# Patient Record
Sex: Male | Born: 1940 | Race: White | Hispanic: No | Smoking: Former smoker
Health system: Southern US, Community
[De-identification: ages and names within clinical notes are randomized; demographics above are authoritative.]

## PROBLEM LIST (undated history)

## (undated) DIAGNOSIS — C801 Malignant (primary) neoplasm, unspecified: Secondary | ICD-10-CM

## (undated) DIAGNOSIS — M1990 Unspecified osteoarthritis, unspecified site: Secondary | ICD-10-CM

## (undated) DIAGNOSIS — I493 Ventricular premature depolarization: Secondary | ICD-10-CM

## (undated) DIAGNOSIS — R011 Cardiac murmur, unspecified: Secondary | ICD-10-CM

## (undated) DIAGNOSIS — C642 Malignant neoplasm of left kidney, except renal pelvis: Secondary | ICD-10-CM

## (undated) DIAGNOSIS — R600 Localized edema: Secondary | ICD-10-CM

## (undated) DIAGNOSIS — Z8659 Personal history of other mental and behavioral disorders: Secondary | ICD-10-CM

## (undated) DIAGNOSIS — I050 Rheumatic mitral stenosis: Secondary | ICD-10-CM

## (undated) DIAGNOSIS — Z8679 Personal history of other diseases of the circulatory system: Secondary | ICD-10-CM

## (undated) DIAGNOSIS — N2889 Other specified disorders of kidney and ureter: Secondary | ICD-10-CM

## (undated) DIAGNOSIS — I350 Nonrheumatic aortic (valve) stenosis: Secondary | ICD-10-CM

## (undated) DIAGNOSIS — R0600 Dyspnea, unspecified: Secondary | ICD-10-CM

## (undated) DIAGNOSIS — T4145XA Adverse effect of unspecified anesthetic, initial encounter: Secondary | ICD-10-CM

## (undated) DIAGNOSIS — Z7189 Other specified counseling: Secondary | ICD-10-CM

## (undated) DIAGNOSIS — I2510 Atherosclerotic heart disease of native coronary artery without angina pectoris: Secondary | ICD-10-CM

## (undated) DIAGNOSIS — F32A Depression, unspecified: Secondary | ICD-10-CM

## (undated) DIAGNOSIS — I429 Cardiomyopathy, unspecified: Secondary | ICD-10-CM

## (undated) DIAGNOSIS — C649 Malignant neoplasm of unspecified kidney, except renal pelvis: Secondary | ICD-10-CM

## (undated) DIAGNOSIS — I452 Bifascicular block: Secondary | ICD-10-CM

## (undated) DIAGNOSIS — E785 Hyperlipidemia, unspecified: Secondary | ICD-10-CM

## (undated) DIAGNOSIS — I10 Essential (primary) hypertension: Secondary | ICD-10-CM

## (undated) DIAGNOSIS — D649 Anemia, unspecified: Secondary | ICD-10-CM

## (undated) DIAGNOSIS — F329 Major depressive disorder, single episode, unspecified: Secondary | ICD-10-CM

## (undated) DIAGNOSIS — K222 Esophageal obstruction: Secondary | ICD-10-CM

## (undated) DIAGNOSIS — T8859XA Other complications of anesthesia, initial encounter: Secondary | ICD-10-CM

## (undated) DIAGNOSIS — F419 Anxiety disorder, unspecified: Secondary | ICD-10-CM

## (undated) HISTORY — PX: UPPER GI ENDOSCOPY: SHX6162

## (undated) HISTORY — PX: CARDIOVERSION: SHX1299

## (undated) HISTORY — PX: COLONOSCOPY: SHX174

## (undated) HISTORY — DX: Malignant neoplasm of left kidney, except renal pelvis: C64.2

## (undated) HISTORY — PX: ESOPHAGEAL DILATION: SHX303

## (undated) HISTORY — PX: TOTAL HIP ARTHROPLASTY: SHX124

## (undated) HISTORY — DX: Other specified counseling: Z71.89

## (undated) HISTORY — PX: TOTAL KNEE ARTHROPLASTY: SHX125

## (undated) HISTORY — PX: KNEE ARTHROSCOPY: SHX127

## (undated) HISTORY — PX: SHOULDER ARTHROSCOPY: SHX128

## (undated) HISTORY — PX: TOTAL SHOULDER REPLACEMENT: SUR1217

---

## 1999-04-22 ENCOUNTER — Ambulatory Visit (HOSPITAL_COMMUNITY): Admission: RE | Admit: 1999-04-22 | Discharge: 1999-04-22 | Payer: Self-pay | Admitting: Gastroenterology

## 2000-02-09 ENCOUNTER — Encounter: Admission: RE | Admit: 2000-02-09 | Discharge: 2000-02-09 | Payer: Self-pay | Admitting: *Deleted

## 2000-02-09 ENCOUNTER — Encounter: Payer: Self-pay | Admitting: *Deleted

## 2000-06-10 ENCOUNTER — Inpatient Hospital Stay (HOSPITAL_COMMUNITY): Admission: RE | Admit: 2000-06-10 | Discharge: 2000-06-14 | Payer: Self-pay | Admitting: Orthopaedic Surgery

## 2000-06-13 ENCOUNTER — Encounter: Payer: Self-pay | Admitting: Orthopaedic Surgery

## 2004-01-01 ENCOUNTER — Inpatient Hospital Stay (HOSPITAL_COMMUNITY): Admission: RE | Admit: 2004-01-01 | Discharge: 2004-01-03 | Payer: Self-pay | Admitting: Orthopaedic Surgery

## 2004-09-02 ENCOUNTER — Inpatient Hospital Stay (HOSPITAL_COMMUNITY): Admission: RE | Admit: 2004-09-02 | Discharge: 2004-09-06 | Payer: Self-pay | Admitting: Orthopaedic Surgery

## 2004-09-24 ENCOUNTER — Ambulatory Visit: Payer: Self-pay | Admitting: Internal Medicine

## 2007-03-02 DIAGNOSIS — I10 Essential (primary) hypertension: Secondary | ICD-10-CM | POA: Insufficient documentation

## 2007-03-02 DIAGNOSIS — K219 Gastro-esophageal reflux disease without esophagitis: Secondary | ICD-10-CM | POA: Insufficient documentation

## 2008-03-13 ENCOUNTER — Inpatient Hospital Stay (HOSPITAL_COMMUNITY): Admission: RE | Admit: 2008-03-13 | Discharge: 2008-03-17 | Payer: Self-pay | Admitting: Orthopaedic Surgery

## 2010-03-25 ENCOUNTER — Inpatient Hospital Stay (HOSPITAL_COMMUNITY): Admission: RE | Admit: 2010-03-25 | Discharge: 2010-03-31 | Payer: Self-pay | Admitting: Orthopaedic Surgery

## 2010-09-10 LAB — PROTIME-INR
INR: 1.61 — ABNORMAL HIGH (ref 0.00–1.49)
INR: 2.01 — ABNORMAL HIGH (ref 0.00–1.49)
INR: 2.08 — ABNORMAL HIGH (ref 0.00–1.49)
Prothrombin Time: 19.3 seconds — ABNORMAL HIGH (ref 11.6–15.2)
Prothrombin Time: 22.9 seconds — ABNORMAL HIGH (ref 11.6–15.2)
Prothrombin Time: 23.5 seconds — ABNORMAL HIGH (ref 11.6–15.2)

## 2010-09-10 LAB — CBC
HCT: 36.7 % — ABNORMAL LOW (ref 39.0–52.0)
HCT: 37 % — ABNORMAL LOW (ref 39.0–52.0)
HCT: 37.8 % — ABNORMAL LOW (ref 39.0–52.0)
Hemoglobin: 12.4 g/dL — ABNORMAL LOW (ref 13.0–17.0)
Hemoglobin: 12.6 g/dL — ABNORMAL LOW (ref 13.0–17.0)
Hemoglobin: 12.8 g/dL — ABNORMAL LOW (ref 13.0–17.0)
MCH: 30 pg (ref 26.0–34.0)
MCH: 30.5 pg (ref 26.0–34.0)
MCH: 31 pg (ref 26.0–34.0)
MCHC: 33.8 g/dL (ref 30.0–36.0)
MCHC: 33.9 g/dL (ref 30.0–36.0)
MCHC: 34.1 g/dL (ref 30.0–36.0)
MCV: 88.6 fL (ref 78.0–100.0)
MCV: 90.2 fL (ref 78.0–100.0)
MCV: 90.9 fL (ref 78.0–100.0)
Platelets: 131 10*3/uL — ABNORMAL LOW (ref 150–400)
Platelets: 147 10*3/uL — ABNORMAL LOW (ref 150–400)
Platelets: 167 10*3/uL (ref 150–400)
RBC: 4.07 MIL/uL — ABNORMAL LOW (ref 4.22–5.81)
RBC: 4.14 MIL/uL — ABNORMAL LOW (ref 4.22–5.81)
RBC: 4.19 MIL/uL — ABNORMAL LOW (ref 4.22–5.81)
RDW: 13 % (ref 11.5–15.5)
RDW: 13 % (ref 11.5–15.5)
RDW: 13 % (ref 11.5–15.5)
WBC: 5.7 10*3/uL (ref 4.0–10.5)
WBC: 6.2 10*3/uL (ref 4.0–10.5)
WBC: 6.4 10*3/uL (ref 4.0–10.5)

## 2010-09-10 LAB — BASIC METABOLIC PANEL
BUN: 12 mg/dL (ref 6–23)
BUN: 13 mg/dL (ref 6–23)
BUN: 15 mg/dL (ref 6–23)
CO2: 30 mEq/L (ref 19–32)
CO2: 31 mEq/L (ref 19–32)
CO2: 31 mEq/L (ref 19–32)
Calcium: 8.5 mg/dL (ref 8.4–10.5)
Calcium: 8.6 mg/dL (ref 8.4–10.5)
Calcium: 8.7 mg/dL (ref 8.4–10.5)
Chloride: 100 mEq/L (ref 96–112)
Chloride: 103 mEq/L (ref 96–112)
Chloride: 103 mEq/L (ref 96–112)
Creatinine, Ser: 0.9 mg/dL (ref 0.4–1.5)
Creatinine, Ser: 0.93 mg/dL (ref 0.4–1.5)
Creatinine, Ser: 0.94 mg/dL (ref 0.4–1.5)
GFR calc Af Amer: >60 mL/min (ref >60)
GFR calc Af Amer: >60 mL/min (ref >60)
GFR calc Af Amer: >60 mL/min (ref >60)
GFR calc non Af Amer: >60 mL/min (ref >60)
GFR calc non Af Amer: >60 mL/min (ref >60)
GFR calc non Af Amer: >60 mL/min (ref >60)
Glucose, Bld: 101 mg/dL — ABNORMAL HIGH (ref 70–99)
Glucose, Bld: 112 mg/dL — ABNORMAL HIGH (ref 70–99)
Glucose, Bld: 98 mg/dL (ref 70–99)
Potassium: 3.3 mEq/L — ABNORMAL LOW (ref 3.5–5.1)
Potassium: 3.7 mEq/L (ref 3.5–5.1)
Potassium: 4 mEq/L (ref 3.5–5.1)
Sodium: 137 mEq/L (ref 135–145)
Sodium: 138 mEq/L (ref 135–145)
Sodium: 140 mEq/L (ref 135–145)

## 2010-09-11 LAB — CBC
HCT: 36.6 % — ABNORMAL LOW (ref 39.0–52.0)
HCT: 39.8 % (ref 39.0–52.0)
HCT: 39.9 % (ref 39.0–52.0)
HCT: 48.3 % (ref 39.0–52.0)
Hemoglobin: 12.1 g/dL — ABNORMAL LOW (ref 13.0–17.0)
Hemoglobin: 13.4 g/dL (ref 13.0–17.0)
Hemoglobin: 13.6 g/dL (ref 13.0–17.0)
Hemoglobin: 16.8 g/dL (ref 13.0–17.0)
MCH: 30.1 pg (ref 26.0–34.0)
MCH: 30.3 pg (ref 26.0–34.0)
MCH: 30.9 pg (ref 26.0–34.0)
MCH: 31.5 pg (ref 26.0–34.0)
MCHC: 33.1 g/dL (ref 30.0–36.0)
MCHC: 33.7 g/dL (ref 30.0–36.0)
MCHC: 34.1 g/dL (ref 30.0–36.0)
MCHC: 34.8 g/dL (ref 30.0–36.0)
MCV: 88.9 fL (ref 78.0–100.0)
MCV: 90.4 fL (ref 78.0–100.0)
MCV: 91 fL (ref 78.0–100.0)
MCV: 91.7 fL (ref 78.0–100.0)
Platelets: 119 10*3/uL — ABNORMAL LOW (ref 150–400)
Platelets: 137 10*3/uL — ABNORMAL LOW (ref 150–400)
Platelets: 144 10*3/uL — ABNORMAL LOW (ref 150–400)
Platelets: 173 10*3/uL (ref 150–400)
RBC: 4.02 MIL/uL — ABNORMAL LOW (ref 4.22–5.81)
RBC: 4.34 MIL/uL (ref 4.22–5.81)
RBC: 4.49 MIL/uL (ref 4.22–5.81)
RBC: 5.34 MIL/uL (ref 4.22–5.81)
RDW: 13 % (ref 11.5–15.5)
RDW: 13 % (ref 11.5–15.5)
RDW: 13 % (ref 11.5–15.5)
RDW: 13.3 % (ref 11.5–15.5)
WBC: 10 10*3/uL (ref 4.0–10.5)
WBC: 11.6 10*3/uL — ABNORMAL HIGH (ref 4.0–10.5)
WBC: 7.1 10*3/uL (ref 4.0–10.5)
WBC: 8.3 10*3/uL (ref 4.0–10.5)

## 2010-09-11 LAB — BASIC METABOLIC PANEL
BUN: 13 mg/dL (ref 6–23)
BUN: 15 mg/dL (ref 6–23)
BUN: 16 mg/dL (ref 6–23)
CO2: 28 mEq/L (ref 19–32)
CO2: 29 mEq/L (ref 19–32)
CO2: 30 mEq/L (ref 19–32)
Calcium: 8.7 mg/dL (ref 8.4–10.5)
Calcium: 8.7 mg/dL (ref 8.4–10.5)
Calcium: 8.8 mg/dL (ref 8.4–10.5)
Chloride: 102 mEq/L (ref 96–112)
Chloride: 103 mEq/L (ref 96–112)
Chloride: 97 mEq/L (ref 96–112)
Creatinine, Ser: 0.95 mg/dL (ref 0.4–1.5)
Creatinine, Ser: 0.97 mg/dL (ref 0.4–1.5)
Creatinine, Ser: 1.07 mg/dL (ref 0.4–1.5)
GFR calc Af Amer: >60 mL/min (ref >60)
GFR calc Af Amer: >60 mL/min (ref >60)
GFR calc Af Amer: >60 mL/min (ref >60)
GFR calc non Af Amer: >60 mL/min (ref >60)
GFR calc non Af Amer: >60 mL/min (ref >60)
GFR calc non Af Amer: >60 mL/min (ref >60)
Glucose, Bld: 106 mg/dL — ABNORMAL HIGH (ref 70–99)
Glucose, Bld: 106 mg/dL — ABNORMAL HIGH (ref 70–99)
Glucose, Bld: 116 mg/dL — ABNORMAL HIGH (ref 70–99)
Potassium: 3.7 mEq/L (ref 3.5–5.1)
Potassium: 4 mEq/L (ref 3.5–5.1)
Potassium: 4.1 mEq/L (ref 3.5–5.1)
Sodium: 132 mEq/L — ABNORMAL LOW (ref 135–145)
Sodium: 137 mEq/L (ref 135–145)
Sodium: 138 mEq/L (ref 135–145)

## 2010-09-11 LAB — CROSSMATCH
ABO/RH(D): A POS
Antibody Screen: NEGATIVE

## 2010-09-11 LAB — DIFFERENTIAL
Basophils Absolute: 0 10*3/uL (ref 0.0–0.1)
Basophils Relative: 0 % (ref 0–1)
Eosinophils Absolute: 0.1 10*3/uL (ref 0.0–0.7)
Eosinophils Relative: 1 % (ref 0–5)
Lymphocytes Relative: 10 % — ABNORMAL LOW (ref 12–46)
Lymphs Abs: 0.8 10*3/uL (ref 0.7–4.0)
Monocytes Absolute: 1.3 10*3/uL — ABNORMAL HIGH (ref 0.1–1.0)
Monocytes Relative: 15 % — ABNORMAL HIGH (ref 3–12)
Neutro Abs: 6.1 10*3/uL (ref 1.7–7.7)
Neutrophils Relative %: 74 % (ref 43–77)

## 2010-09-11 LAB — URINE CULTURE
Colony Count: 8000
Culture  Setup Time: 201109231503

## 2010-09-11 LAB — URINALYSIS, ROUTINE W REFLEX MICROSCOPIC
Bilirubin Urine: NEGATIVE
Glucose, UA: NEGATIVE mg/dL
Hgb urine dipstick: NEGATIVE
Ketones, ur: NEGATIVE mg/dL
Leukocytes, UA: NEGATIVE
Nitrite: NEGATIVE
Protein, ur: 30 mg/dL — AB
Specific Gravity, Urine: 1.018 (ref 1.005–1.030)
Urobilinogen, UA: 1 mg/dL (ref 0.0–1.0)
pH: 6.5 (ref 5.0–8.0)

## 2010-09-11 LAB — EXPECTORATED SPUTUM ASSESSMENT W GRAM STAIN, RFLX TO RESP C

## 2010-09-11 LAB — COMPREHENSIVE METABOLIC PANEL
ALT: 22 U/L (ref 0–53)
AST: 22 U/L (ref 0–37)
Albumin: 4.3 g/dL (ref 3.5–5.2)
Alkaline Phosphatase: 62 U/L (ref 39–117)
BUN: 19 mg/dL (ref 6–23)
CO2: 30 mEq/L (ref 19–32)
Calcium: 9.7 mg/dL (ref 8.4–10.5)
Chloride: 99 mEq/L (ref 96–112)
Creatinine, Ser: 1.07 mg/dL (ref 0.4–1.5)
GFR calc Af Amer: >60 mL/min (ref >60)
GFR calc non Af Amer: >60 mL/min (ref >60)
Glucose, Bld: 80 mg/dL (ref 70–99)
Potassium: 4.1 mEq/L (ref 3.5–5.1)
Sodium: 137 mEq/L (ref 135–145)
Total Bilirubin: 0.7 mg/dL (ref 0.3–1.2)
Total Protein: 6.5 g/dL (ref 6.0–8.3)

## 2010-09-11 LAB — EXPECTORATED SPUTUM ASSESSMENT W REFEX TO RESP CULTURE

## 2010-09-11 LAB — URINE MICROSCOPIC-ADD ON

## 2010-09-11 LAB — SURGICAL PCR SCREEN
MRSA, PCR: NEGATIVE
Staphylococcus aureus: NEGATIVE

## 2010-09-11 LAB — PROTIME-INR
INR: 0.92 (ref 0.00–1.49)
INR: 1.17 (ref 0.00–1.49)
Prothrombin Time: 12.6 seconds (ref 11.6–15.2)
Prothrombin Time: 15.1 seconds (ref 11.6–15.2)

## 2010-09-11 LAB — APTT: aPTT: 30 seconds (ref 24–37)

## 2010-11-11 NOTE — Op Note (Signed)
NAME:  Barry Curry, SENNETT NO.:  [id number]   MEDICAL RECORD NO.:  [id number]          PATIENT TYPE:  INP   LOCATION:  5025                         FACILITY:  [facility]   PHYSICIAN:  Claude Manges. Whitfield, M.D.DATE OF BIRTH:  May 22, 1941   DATE OF PROCEDURE:  DATE OF DISCHARGE:                               OPERATIVE REPORT   PREOPERATIVE DIAGNOSIS:  End-stage osteoarthritis left hip.   POSTOPERATIVE DIAGNOSIS:  End-stage osteoarthritis left hip.   PROCEDURES:  Left total hip replacement.   SURGEON:  Claude Manges. Cleophas Dunker, MD   ASSISTANTS:  Lenard Galloway. Chaney Malling, MD and Oris Drone. Petrarca, PA-C   ANESTHESIA:  General orotracheal.   COMPLICATIONS:  None.   COMPONENTS:  DePuy AML 16.5 mm large stature femoral component, 36 mm  outer diameter hip ball with a 8.5 mm neck length.  A 100 series 58 mm  outer diameter metallic acetabular component with an apex hole  eliminator and a +4 Pinnacle Marathon acetabular liner with a 10-degree  posterior lip, hollow press-fit.   PROCEDURE:  The patient was met in the preoperative area and any  questions were answered.  The appropriate operative extremity was  marked.   The patient was taken to room 15.  He was placed under general  orotracheal anesthesia, the nursing staff inserted a Foley catheter.  Urine was clear.   The patient was then placed in the lateral decubitus position with the  left side up and secured to the operating room table with an Innomed hip  system.   The left hip was then prepped with DuraPrep from the iliac crest to  below the knees x2.  Sterile draping was performed.   A routine southern incision was utilized and via sharp dissection  carried down to subcutaneous tissue.  Adipose tissue was then incised to  the level of the iliotibial band.  The iliotibial band was then incised  along the length of the skin incision.  A self-retaining retractors were  inserted with hip internally rotated.  The short  external rotators were  identified.  Tendinous structures were tagged with 0 Ethibond suture.  The capsule was identified and incised along the femoral neck and head.  There was a serosanguineous effusion and moderate synovitis.  The head  was then dislocated posteriorly.  There were large areas of articular  cartilage loss and some loss of the oval shape.  It was then  osteotomized and brought a fingerbreadth proximal to the lesser  trochanter using the calcar guide.  Head was then delivered from the  wound.  Retractors were then placed around the proximal femur.  A  starter hole was then made followed by the canal finder.  Reaming was  performed sequentially to 15 mm until we had good purchase.  I then  rasped to large 15 component.  I felt like that it was too small so that  I reamed to a 16.5 component and then sequentially inserted rasp to 16.5  large and this was a very nice fit.  I used a calcar reamer to obtain  the appropriate calcar angle.   Retractor was  then placed about the acetabulum.  The large labrum was  excised.  Reaming was performed sequentially to 57 mm to accept a 58 mm  component.  We trailed the 56 and 58, the 56 would completely seat, but  we had loose rim fit to 58 would not completely seat.  Accordingly, the  58 mm outer diameter Marathon 100 series acetabular component was then  impacted.  We inserted the trial +4 polyethylene liner.  The 16.5 rasp  was then inserted.  We trailed several neck lengths and we still felt we  had some instability, so we finally decided on a 8.5 mm neck length,  which may slightly increase his leg length with a Prodigy stem and that  we repositioned the acetabulum twice at which point we had a very nice  stability.  The trial components were then removed.  The joint was  copiously irrigated.  We checked the acetabulum, was nice and tight.  Apex hole eliminator was inserted followed by the +4 polyethylene liner.  The 16.5 mm large  stature femoral component was then impacted on the  calcar using the Prodigy stem allowing 15 degrees of extra anteversion.  We trailed several neck lengths and still felt that the 8.5 gave [place] very  nice stability.  Accordingly, the final 36 mm diameter hip ball with the  8.5 neck length was then placed on the [facility] taper neck after cleaning  the neck with a sterile gauze.  The entire construct was then reduced  and through a full range of motion, there was no subluxation.  I told we  are probably an eighth of an inch longer.   The wounds were then copiously irrigated with saline solution.  The  capsule was closed anatomically with interrupted #1 Ethibond.  The short  external rotators were closed with similar material.  The wound was  again irrigated.  The iliotibial band was closed with a running 0  Vicryl, the subcu was closed in several layers of 0 and 2-0 Vicryl.  Skin was closed with skin clips.  Marcaine with epinephrine injected  into the wound edges.  Sterile bulky dressing was placed.  The patient  was then placed supine and returned to the Postanesthesia Recovery Room  in satisfactory condition.      Claude Manges. Cleophas Dunker, M.D.  Electronically Signed     PWW/MEDQ  D:  03/13/2008  T:  03/14/2008  Job:  [id number]

## 2010-11-11 NOTE — Consult Note (Signed)
NAMEJAKAREE, Barry Curry             ACCOUNT NO.:  [id number]   MEDICAL RECORD NO.:  [id number]          PATIENT TYPE:  INP   LOCATION:  5025                         FACILITY:  [facility]   PHYSICIAN:  Ladell Pier, M.D.   DATE OF BIRTH:  30-Oct-1940   DATE OF CONSULTATION:  03/13/2008  DATE OF DISCHARGE:                                 CONSULTATION   REASON FOR CONSULTATION:  Hypotension.   HISTORY OF PRESENT ILLNESS:  The patient is a 70 year old white male  with past medical history significant for hypertension, GERD, and  multiple orthopedic surgeries.  The patient was admitted for left hip  surgery.  Postoperative, the patient developed hypotension.  Blood  pressures into the 60s.  During the course of low blood pressure, he had  no chest pain, no shortness of breath.  He has no history of coronary  artery disease.  He took his medications this morning prior to his  surgery.  He has no other complaints.   PAST MEDICAL HISTORY:  1. 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  2. Status post tonsillectomy.  3. Repair of his left hand after a traumatic injury in 1966.  4. Right knee arthroscopic surgery in 1991.  5. Left knee arthroscopy in 1984.  6. Right shoulder arthroscopy with Achilles tendon repair in 1995.  7. Right shoulder hemiarthroplasty in 1996.  8. Left total knee arthroplasty in 2001.  9. Cataract removal.   FAMILY HISTORY:  Noncontributory.   SOCIAL HISTORY:  The patient quit tobacco back in 1973.  Does not drink  routinely.  He is married.  Lives with his wife.  He is from [place].  He is a partially retired Animator.  He still works some.  He  has 2 daughters.   MEDICATIONS:  1. Aspirin 81 mg daily.  2. Ibuprofen 800 mg daily.  3. Prinvil 20 mg daily.  4. Maxzide 10 mg daily.   ALLERGIES:  PENICILLIN, VIOXX.   REVIEW OF SYSTEMS:  As per HPI.  Otherwise,  negative.   PHYSICAL EXAM:  VITAL SIGNS:  Temperature 97.5, pulse 83, respirations  20, blood pressure 92/58.  Pulse ox 93% on 2L.  HEENT:  Normocephalic, atraumatic.  His pupils equal, round, and  reactive to light.  His throat was without erythema.  CARDIOVASCULAR:  Regular rate and rhythm.  LUNGS:  Mainly clear bilaterally.  ABDOMEN:  Obese.  Positive bowel sounds.  EXTREMITIES:  Without edema.  He did have the SCD hose on.   LABS:  WBC 13.9, hemoglobin 12.2, MCV 91.9, platelets 137,000.   ASSESSMENT AND PLAN:  1. Hypotension.  2. History of hypertension.  3. Gastroesophageal reflux disease.  4. Leukocytosis.  5. Mild acute blood loss anemia.  6. Mild thrombocytopenia.  7. Hypoxia.   The patient most likely was volume depleted.  He was bolused with two  500 mL normal saline.  Blood pressure is currently increased and doing  well.  His blood pressure will be monitored q.2 hours.  Will continue  his IV fluids  at 125 per 3L and decrease to 100 mL an hour.  Will  recheck his CBC this evening at 6 p.m.  Will also get a chest x-ray and  an EKG for completeness, and will also get a urinalysis.  Thanks to Dr.  Cleophas Dunker for this consult.      Ladell Pier, M.D.  Electronically Signed     NJ/MEDQ  D:  03/13/2008  T:  03/13/2008  Job:  [id number]

## 2010-11-14 NOTE — Op Note (Signed)
NAME:  Barry Curry, Barry Curry NO.:  [id number]   MEDICAL RECORD NO.:  [id number]          PATIENT TYPE:  INP   LOCATION:  2550                         FACILITY:  [facility]   PHYSICIAN:  Claude Manges. Whitfield, M.D.DATE OF BIRTH:  01-13-1941   DATE OF PROCEDURE:  09/02/2004  DATE OF DISCHARGE:                                 OPERATIVE REPORT   PREOPERATIVE DIAGNOSIS:  End-stage osteoarthritis, right knee.   POSTOPERATIVE DIAGNOSIS:  End-stage osteoarthritis, right knee.   PROCEDURE:  Right total knee replacement.   SURGEON:  Claude Manges. Cleophas Dunker, M.D.   ASSISTANT:  Richardean Canal, P.A.-C.   ANESTHESIA:  General orotracheal with supplemental femoral nerve block.   COMPLICATIONS:  None.   COMPONENTS:  DePuy LCS complete large femoral component, a #5 rotating  keeled tibial component with a 10-mm bridging bearing, the metal bag  rotating 3-peg patella, all were secured with polymethyl methacrylate.   DESCRIPTION OF PROCEDURE:  With the patient comfortable on the operating  table and under general orotracheal anesthesia with a supplemental femoral  nerve block, the nursing staff inserted a Foley catheter.  The tourniquet  was then applied to the right lower extremity.  The right lower extremity  was then prepped with Betadine scrub and then Duraprep from the tourniquet  to the mid foot where sterile draping was performed.  With the extremity  still elevated, it was Esmarch exsanguinated with the proximal tourniquet at  350 mmHg.   A midline longitudinal incision was made centered at the patella and  extending from the superior part of the tibial tubercle.  By sharp  dissection, the incision was carried down through the subcutaneous tissue  and gross bleeders were Bovie coagulated.  The prepatellar bursa was  encountered and this was sharply excised.  The first layer of capsule was  incised in the midline and a medial parapatellar incision was then made with  the Bovie to the  deep capsule.  There was a medial joint effusion.  The  patella was then __________ 180 degrees laterally and the knee flexed to 90  degrees.  There were large osteophytes along the medial and lateral femoral  condyles and complete absence of articular cartilage in the medial  compartment at the tibia and femur and large osteophytes about the patella  with little, if any, remaining cartilage.  There was a fixed __________ and  a medial release was performed.   The computer navigation system was utilized.  Two Schanz pins were placed on  the tibia and two on the femur, and the arrays were applied.  Mapping of the  knee joint was then performed to obtain the appropriate size femur and the  tibia.  We also obtained symmetrical flexion and extension gaps after a  medial release.  After appropriate mapping, the initial cut was made on the  tibia transversely with a 7-degree angle of declination.  The array was  applied to be sure we were within 3 degrees of the mechanical axis in all of  our cuts in both the tibia and the femur.  Subsequent cuts were then made on  the femur based on the computer mapping.  Retractors were then placed about  the tibia and laminar spreaders were inserted.  The medial and lateral  menisci were removed as well as any remnants of the ACL and PCL.  There was  a large __________ that was fixed to soft of the medial compartment and this  was left in place.  Osteophytes were removed from the posterior femoral  condyle.  Final cuts were then made on the femur with a 4-degree distal  femoral valgus cut.  The finishing guide was applied to obtain the  appropriate oblique cuts on the femur and obtain the guide holes for the  femoral component.  Retractors were placed about the tibia and the center  hole was made after applying a #5 tibial jig.  The keeled cuts were then  made.   The trial components were inserted.  We used a 10-mm flexion gap which  seemed to give [place] full  extension and excellent flexion without malrotation  of the components and very little opening with varus and valgus stress.   The patella was prepared by removing 12 mm of bone, leaving 13 mm of  patellar thickness.  The patella jig was then applied to obtain the three  holes.  The patella was applied through a full range of motion and there was  no subluxation.   The trial components were removed.  The joint was then copiously irrigated  with jet saline.  The final components were then impacted with polymethyl  methacrylate with an excellent position.  Excess methacrylate was removed  with the Cisco.  The patella was applied with a patellar clamp.   After complete maturation of the methacrylate, the joint was inspected and  extraneous methacrylate was removed with an osteotome.  The knee was placed  through a full range of motion with varus and valgus stress and it had full  extension and flexion well beyond 115 degrees without malrotation of the  components.   The tourniquet was deflated, gross bleeders were Bovie coagulated and a  Hemovac was inserted.  The deep capsule was closed with an interrupted #1  Ethibond.  The superficial capsule was closed with a running 0 Vicryl, the  subcu with 2-0 Vicryl and the skin closed with skin clips.  A sterile bulky  dressing was applied, followed by patient support stocking.   The patient tolerated the procedure well without complications.      PWW/MEDQ  D:  09/02/2004  T:  09/02/2004  Job:  [id number]

## 2010-11-14 NOTE — Discharge Summary (Signed)
NAME:  Barry Curry, Barry Curry NO.:  [id number]   MEDICAL RECORD NO.:  [id number]          PATIENT TYPE:  INP   LOCATION:  5025                         FACILITY:  [facility]   PHYSICIAN:  Claude Manges. Whitfield, M.D.DATE OF BIRTH:  12/01/1940   DATE OF ADMISSION:  03/13/2008  DATE OF DISCHARGE:  03/17/2008                               DISCHARGE SUMMARY   ADMISSION DIAGNOSIS:  Osteoarthritis of the left hip.   DISCHARGE DIAGNOSES:  1. Osteoarthritis.  2. History of hypertension.  3. Obesity.  4. Hypotension.  5. Gastroesophageal reflux disease.  6. Thrombocytopenia.  7. Hypoxia.  8. Hypokalemia.   PROCEDURE:  Left total hip arthroplasty.   HISTORY OF PRESENT ILLNESS:  A 70 year old white male with chronic left  hip and groin pain which has had a gradual onset, is now worsening to  where the pain is extremely severe, aching, and throbbing pain.  This  will awake him at night time.  Uses ibuprofen and Darvocet for the pain.  Radiographic, end-stage osteoarthritis of left hip.  Admitted for left  total hip arthroplasty.   HOSPITAL COURSE:  A 70 year old white male who was admitted on March 13, 2008.  After appropriate laboratory studies were obtained as well as  2 g Ancef IV on-call to the operating room.  He was taken to the  operating room where he underwent a left total hip arthroplasty.  He  tolerated the procedure well.  Placed with a reduced dose of Dilaudid  PCA pump.  Ancef 1 g IV q.6 h. x3 doses postop was given.  Foley was  placed intraoperatively.  Consults with PT, OT and care management were  made.  Weightbearing as tolerated.  Lovenox was started at 30 mg subcu  q.12 h. on March 14, 2008, at 8 a.m.  Coumadin per pharmacy  protocol.  He was allowed out of bed to chair the following day.  He did  have some problems with blood pressure and urine output, and he had his  IVs up to 125 per hour.  Another bolus was given that of 500 bolus on  the March 13, 2008.  PA and lateral chest x-ray was ordered by Dr.  Olena Leatherwood.  H&H at 6 p.m. and EKGs were ordered also.  Cardiac enzymes  were obtained as well as a spiral CT of the chest on March 13, 2008.  A Doppler of the carotid, ultrasounds to include her vertebral arteries  was to be given instead of spiral CT to rule out subclavian steal  syndrome by Dr. Orvan Falconer.  The patient had very rocky initial postop  course.  He was placed on Toradol 30 mg IV q.6 h. for x2 doses.  On the  March 14, 2008, he was allowed out of bed to chair.  His Toradol was  discontinued.  His Lovenox was held.  IVs were to be controlled by the  hospitalists.  The carotid Dopplers were discontinued, and he was just  placed with normal saline of 100 mL per hour.  Home health orders were  given on the March 15, 2008, for weightbearing as tolerated  and 4  weeks of Coumadin.  His IV was infiltrated on the March 15, 2008,  and was not restarted.  He was placed on iron sulfate.  HIT screen was  obtained which was negative.  A barium swallow with tablets to rule out  stricture was ordered by Algis Downs, PA.  On the March 15, 2008,  his dressing was changed, his wounds did not show any signs of  infection.  On the March 16, 2008, he was hypokalemic and was  started on 30 mg p.o. b.i.d.  On the March 16, 2008, Lovenox was  restarted to 40 mg subcu.  On the March 16, 2008, he remained  hypokalemic, and on the March 16, 2008, he had 40 mEq of K-Dur given  and also this was repeated times one.  On the March 17, 2008, his  Lovenox was discontinued.  His IV was discontinued.  He was planned at  this time to be able to be discharged on the March 17, 2008, to  follow back up with [place] in the office for followup.  EKG revealed normal  sinus rhythm with incomplete right bundle branch block, rightward axis.  This was on March 13, 2008, at 606.  Repeat EKG on March 13, 2008, at 1757  showed normal sinus rhythm, left axis deviation.  Rightward axis resolved since previous EKG.  Chest x-ray of September  2009 revealed no acute cardiopulmonary abnormalities.  There was a  thoracic spondylosis.  Portable pelvis of March 13, 2008, revealed  normal alignment of visualized bipolar hemiarthroplasty on the left.  Hip of March 13, 2008, reveals normal alignment of the left hip  hemiarthroplasty.  Portable chest on March 13, 2008, revealed low  lung volumes, right lung base scarring.   LABORATORY STUDIES:  Hemoglobin 16.1, hematocrit 47.5%, white count  8,100, and platelets 173,000.  Discharge hemoglobin 11.5, hematocrit  33.5%, white count 7,800 and platelets 167,000.  Protime preop 12.9, INR  1.0, PTT 30.  Discharge protime 17.5, INR 1.4 and PTT was 45.  HIT  screen was negative.  Preop sodium 139, potassium 4.2, chloride 104, CO2  of  27, glucose 85, BUN 17, creatinine 0.94.  Discharge sodium 140,  potassium 3.4, chloride 102, CO2 of 27, glucose 111, BUN 7, creatinine  0.76.  GFR was greater than 60.  Urinalysis benign for voided urine.  Blood type A+, antibody screen negative.  Urine culture showed 15,000  colonies of E. coli sensitive to cefazolin.  Discharge instructions  given a prescription for Percocet 5/325, 1-2 tabs every 4 hours as  needed for pain.  Robaxin 500 mg 1 p.o. q.6-8 h. p.r.n. for spasms.  Coumadin 5 mg as directed by family doctor, Maxzide 37.5/25 one-half  tablet daily, lisinopril 20 mg daily.  Iron sulfate 325 daily, vitamin D  daily.  No restrictions on diet.  Keep the incision clean and dry,  change dressing daily, cover with sterile 4x4.  Follow the blue  instruction sheet.  Increase activity as tolerated.  No driving or  lifting for 6 weeks.  Ambulate weightbearing as tolerated with his  walker.  [facility] for his Coumadin and PT.  Follow back up with  Dr. Cleophas Dunker on March 26, 2008.  Discharged in improved  condition.      Oris Drone Petrarca, P.A.-C.      Claude Manges. Cleophas Dunker, M.D.  Electronically Signed    BDP/MEDQ  D:  05/27/2008  T:  05/28/2008  Job:  [id number]

## 2010-11-14 NOTE — Discharge Summary (Signed)
NAME:  Barry Curry, Barry Curry                       ACCOUNT NO.:  [id number]   MEDICAL RECORD NO.:  [id number]                   PATIENT TYPE:  INP   LOCATION:  5023                                 FACILITY:  [facility]   PHYSICIAN:  Claude Manges. Cleophas Dunker, M.D.            DATE OF BIRTH:  [birth year]   DATE OF ADMISSION:  01/01/2004  DATE OF DISCHARGE:  01/03/2004                                 DISCHARGE SUMMARY   ADMISSION DIAGNOSES:  1. End-stage osteoarthritis left shoulder.  2. Hypertension.  3. Gastroesophageal reflux disease.  4. History of osteoarthritis bilateral knees and right shoulder, status post     right shoulder hemiarthroplasty and left total knee arthroplasty.   DISCHARGE DIAGNOSES:  1. End-stage osteoarthritis left shoulder.  2. Hypertension.  3. Gastroesophageal reflux disease.  4. History of osteoarthritis bilateral knees and right shoulder, status post     right shoulder hemiarthroplasty and left total knee arthroplasty.   PROCEDURE:  On January 01, 2004, Barry Curry underwent a left shoulder  hemiarthroplasty by Claude Manges. Cleophas Dunker, M.D., assisted by Jamelle Rushing,  P.A.C.  He had a quick anchor plus by DePuy Mitek placed with a [employer]  [employer]vantage humeral stem 12 mm diameter, 145 mm length; and a [employer]age  shoulder eccentric humeral head size 56 mm x18.   COMPLICATIONS:  None.   CONSULTATIONS:  Occupational therapy and case management consult January 02, 2004.   HISTORY OF PRESENT ILLNESS:  A 70 year old white male patient who presented  to Dr. Cleophas Dunker with five to six year history of gradual onset progressive  worsening left shoulder pain.  It is now a constant aching sensation which  is sharp at times over the deltoid region and under the scapula.  Pain  increases with range of motion or use and then decreases with ibuprofen.  He  has failed conservative treatment and because of that, he is presenting for  a left shoulder hemiarthroplasty.   HOSPITAL COURSE:   Barry Curry tolerated his surgical procedure well without  immediate postoperative complications.  He was transferred to 5000.   On postoperative day #1, he was afebrile with vital signs stable.  He had  really no pain.  Block was still working but he had good motion of his  hands.  Plans were made for possible discharge home later that day if his  pain was well controlled but once the block wore off, he did require some  additional IV medications so he was kept until the next day.   On January 03, 2004, he is ready for discharge home.  He is afebrile.  Vital  signs are stable.  Left shoulder incision is benign and dressing has been  changed.  Arm is neurovascularly intact and he will be discharged home  today.   DISCHARGE INSTRUCTIONS:  1. Diet:  He can resume his regular prehospitalization diet.  2. Medications:  He may resume his current hospitalization medications  which     include:     a. Lisinopril 20 mg p.o. q.a.m.     b. Triamterene/hydrochlorothiazide 37.5/25 mg half tablet p.o. q.a.m.     c. Aspirin 81 mg p.o. q.a.m.     d. Ibuprofen 800 mg p.r.n.  3. Additional medications include:     a. Percocet 5/325 mg one to two p.o. q.4h. p.r.n. for pain, 50 with no        refill.     b. Robaxin 500 mg one to two tablets p.o. q.6h. p.r.n. for spasms, 40        with no refill.  4. Activity:  He can be out of bed as tolerated with the left arm in a     sling.  He is to do pendulum exercises two to three times a day.  He can     use ice to that left shoulder as needed for pain control.  5. Wound care:  He is to keep the left shoulder dressing clean and dry.  He     is to notify Dr. Cleophas Dunker of temperature greater than, or equal to,     101.5 degrees F, chills, pain unrelieved by pain medications or foul     smelling drainage from the wound.  He does not need to change the     dressing until his return visit.  6. Follow-up:  He needs follow-up with Dr. Cleophas Dunker in our office on      Monday, January 07, 2004.  He needs to call [phone] for that appointment.   LABORATORY DATA:  White count on January 02, 2004, was 15.1 with hemoglobin of  14.5, hematocrit 41.6, and platelets 163.   On December 26, 2003, glucose was 101 and on January 02, 2004, it was 149.  On December 26, 2003, total bilirubin was 1.4.  All other laboratory studies were within  normal limits.   X-ray taken of his left shoulder on January 01, 2004, showed satisfactory  position of the left humeral head prosthesis with mild left basilar  atelectasis.  Chest x-ray done on December 26, 2003, showed no active disease.      Barry Curry, P.A.                      Claude Manges. Cleophas Dunker, M.D.    KED/MEDQ  D:  01/03/2004  T:  01/03/2004  Job:  [id number]

## 2010-11-14 NOTE — Discharge Summary (Signed)
NAME:  Barry Curry, TITSWORTH NO.:  [id number]   MEDICAL RECORD NO.:  [id number]          PATIENT TYPE:  INP   LOCATION:  5030                         FACILITY:  [facility]   PHYSICIAN:  Claude Manges. Whitfield, M.D.DATE OF BIRTH:  1941/06/20   DATE OF ADMISSION:  09/02/2004  DATE OF DISCHARGE:  09/06/2004                                 DISCHARGE SUMMARY   ADMISSION DIAGNOSES:  1.  Right knee end-stage osteoarthritis.  2.  Hypertension.  3.  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   DISCHARGE DIAGNOSES:  1.  Right total knee arthroplasty.  2.  Mild hypokalemia, resolved.  3.  Hypertension.  4.  Degenerative joint disease in multiple upper and lower joints.   HISTORY OF PRESENT ILLNESS:  The patient is a 70 year old white male with  greater than one year progressively worsening right knee pain and difficulty  with range of motion.  The pain is described as significantly sharp stabbing  type pain with stairs.  He does not trust the stability of his knee.  He  also has general soreness and increased discomfort with any attempts at  range of motion.  He does have popping and grinding within the knee.  The  pain is mostly in the posteromedial aspect, with radiation down the leg.  He  does not have night pain.   ALLERGIES:  1.  PENICILLIN.  2.  VIOXX.   CURRENT MEDICATIONS:  1.  Aspirin 81 mg p.o. daily.  2.  Ibuprofen 800 mg p.o. daily.  3.  Prinivil 20 mg p.o. daily.  4.  Maxzide 10 mg p.o. daily.   SURGICAL PROCEDURE:  On September 02, 2004, the patient was taken to the O.R. by  Norlene Campbell, M.D., assisted by Rexene Edison, P.A.-C.  Under general  anesthesia, the patient underwent a right total knee arthroplasty without  any complications.  The patient had the following components implanted:  A  size large right femoral component, a size 5 keel tibial tray, a size 10 mm  large polyethylene bearing, a size large patellar component.  The components  were implanted with polymethylmethacrylate.  The patient was transferred to  the recovery room and then to the orthopedic floor in good condition on IV  pain medications, antibiotics, heparin, and Coumadin for DVT prophylaxis.  The patient was also placed in a CPM upon leaving the recovery room to the  floor.   CONSULTS:  The following routine consults were posted:  1.  Physical therapy.  2.  Occupational therapy.  3.  Case management.   HOSPITAL COURSE:  On September 02, 2004, the patient was admitted to [facility] under the care of Dr. Norlene Campbell.  The patient was taken to  the O.R., where a right total knee arthroplasty was performed without any  complications.  The patient was placed on IV pain medications and  antibiotics.  He was placed on a CPM in the recovery room and placed on  heparin subcutaneously and Coumadin for DVT prophylaxis.  The patient then  incurred a total of four days postoperative care on the  orthopedic floor, in  which the patient did very well prior to being discharged home with  outpatient home health physical therapy.  The patient was able to transition  from IV medications to p.o. medications very easily, without any issues.  He  did develop some hypokalemia which was borderline but improved with p.o.  replacement.  The patient's wound remained benign for any signs of  infection.  His leg remained neuromotor vascularly intact.  The patient's  biggest issue postoperatively was significant groin and thigh muscle spasms,  but this was corrected and improved significantly with p.o. muscle  relaxants.  It was felt on postop day three the patient was orthopedically  and medically stable and ready for discharge home, so arrangements were  made, and he was discharged to outpatient home health physical therapy up in  [place], and he was discharged to home in good condition.   LABORATORIES:  CBC on September 05, 2004:  WBC 5.8, hemoglobin 10.9, hematocrit   31.1, platelets 120.  INR was 1.5 on August 27, 2004.   Routine chemistries on September 06, 2004:  Sodium 135, potassium 3.6, glucose  90, BUN 9, creatinine 1, calcium 8.2.   Routine urinalysis on admission was normal.   MEDICATIONS UPON DISCHARGE FROM ORTHOPEDIC FLOOR:  1.  Colace 100 mg p.o. b.i.d.  2.  Trinsicon 1 tablet p.o. t.i.d.  3.  Laxative or enema of choice p.r.n.  4.  Percocet 5 mg 1 or 2 tablets q.4-6 h. p.r.n.  This was discontinued, and      the patient was placed on Vicodin 5 mg 1 or 2 tablets q.4-6 h. for good      pain control.  5.  Tylenol 650 mg p.o. q.4 h. p.r.n.  6.  Robaxin 500 mg 1 or 2 tablets q.6 h. p.r.n. muscle spasms.  7.  Restoril 15 mg p.o. at bedtime p.r.n.  8.  Heparin 3,000 units subcutaneously q.12 h. until Coumadin therapeutic.  9.  Coumadin 7.5 mg p.o. daily.  10. OxyContin CR 10  mg p.o. q.12 h.  11. Potassium chloride 20 mEq p.o. b.i.d.   DISCHARGE INSTRUCTIONS:  1.  Coumadin 6 mg p.o. daily unless changed by pharmacy.  2.  Percocet 5 mg 1 tablet q.4-6 h. for pain if needed.  3.  OxyContin CR 10 mg 1 tablet q.12 h. for pain if needed.   ACTIVITY:  Partial weightbearing right leg.   DIET:  No restrictions.   WOUND CARE:  Patient is to change dressing daily.   FOLLOWUP:  The patient is to call [phone] for a follow-up appointment with  Dr. Cleophas Dunker in 10 days.   CONDITION ON DISCHARGE TO HOME:  Improved and good.      RWK/MEDQ  D:  09/11/2004  T:  09/11/2004  Job:  [id number]

## 2010-11-14 NOTE — Discharge Summary (Signed)
[place]  Patient:    Barry Curry, Barry Curry                    MRN: [id number] Adm. Date:  [id number] Disc. Date: [id number] Attending:  Randolm Idol Dictator:   Jamelle Rushing, P.A. CC:         Georg Ruddle. Viviann Spare, M.D.   Discharge Summary  HISTORY OF PRESENT ILLNESS:  Patient is a 70 year old white male with a history of 10-15 years of bilateral knee pain.  Patient had bilateral scopes done with short-term improvement in the past.  Patient has been having progressively worsening knee pain ever since.  The pain currently is constant, worse with any amount of time on his feet, and weightbearing activities.  The pain is a constant aching sensation with sharp pains with awkward movements. He does have radiation of pain up and down his legs.  He does have a clicking and grinding in his knee, it has not given out.  He is not using assistive device.  Currently, the left knee pain is worse than right.  ALLERGIES:  PENICILLIN and VIOXX.  CURRENT MEDICATIONS: 1. Prinivil 20 mg p.o. q.d. 2. Motrin 800 mg one or two tablets p.o. q.d. 3. Hygroton 12.5 mg p.o. q.d. p.r.n. 4. Aspirin 81 mg p.o. q.d. 5. Prilosec 20 mg p.o. q.d.  SURGICAL PROCEDURE:  On June 10, 2000, patient underwent a left total knee replacement.  Surgeon: Dr. Norlene Campbell.  Assisted by: Dr. Vear Clock. Under general anesthesia, the patient had a left total knee replacement with an LCS ______ large femoral component cemented, large plus rotating tibial platform with a 12.5 rotating bridging bearing.  Patient tolerated the procedure well and patient had a femoral nerve block for postoperative pain control by Dr. Gypsy Balsam.  CONSULTATIONS:  On June 10, 2000, the patient had the following routine consults: Physical therapy, rehab, care management, and pharmacy for routine dosing of Coumadin for DVT prophylaxis.  HOSPITAL COURSE:  On June 10, 2000, the patient was  admitted to [facility] under the care of Dr. Norlene Campbell.  The patient was taken to the OR where a left total knee arthroplasty was performed.  The patient had a femoral nerve block for postoperative pain management and the patient was transferred to the recovery room and then to the orthopedic floor in good condition.  Patient then had a four-day postoperative course in which his vital signs remained stable, his H&H also remained stable.  Patient worked very well with physical therapy and his CPM up to 65 degrees over his four-day postoperative course.  Patient had good pain management with first a PCA Demerol and then he was changed over to oral analgesics.  The patient was in fact discharged to home on postop day #4.  LABORATORIES:  June 13, 2000, CBC with wbcs 8.2, hemoglobin 10.1, hematocrit 28.9, and platelets of 157.  On June 14, 2000, PT was 17.1 with a 1.7 INR.  On June 14, 2000, routine chemistries: Sodium 135, potassium 3.4, glucose 139, BUN 23, creatinine 1.1.  DISCHARGE INSTRUCTIONS: 1. Medications: Patient is to resume all routine medications. 2. Coumadin 7.5 mg p.o. q.d. until changed by [facility]. 3. Percocet one or two tablets every four to six hours p.r.n. pain. 4. Activity: Up with walker as instructed by physical therapy. 5. Wound care: Keep wound clean and dry. 6. Special instructions: Notify doctor if any increase in pain, redness,    swelling, drainage,  or temperature greater than 101. 7. Followup: Return to office in one week for followup appointment with    Dr. Cleophas Dunker.  PATIENT CONDITION ON DISCHARGE:  Listed as good. DD:  07/01/00 TD:  07/01/00 Job: [id number]

## 2010-11-14 NOTE — H&P (Signed)
NAME:  Barry Curry, JOKERST NO.:  [id number]   MEDICAL RECORD NO.:  [id number]          PATIENT TYPE:  INP   LOCATION:                               FACILITY:  [facility]   PHYSICIAN:  Claude Manges. Whitfield, M.D.DATE OF BIRTH:  1941/05/20   DATE OF ADMISSION:  09/02/2004  DATE OF DISCHARGE:                                HISTORY & PHYSICAL   CHIEF COMPLAINT:  Right knee pain.   HISTORY OF PRESENT ILLNESS:  The patient is a 70 year old white male with  greater than a one year progressively worsening right knee pain.  The  patient states that he does not trust his knee with weight due to sharp  stabbing pains.  He has difficulty going up and down any steps due to sharp,  stabbing-type pains.  He has popping and grinding within the knee. The pain  worsens with any type of weightbearing activities including range of motion.  This is located mostly along the medial posterior regions. He does feel like  there is something floating around within inside the knee.  He does have  occasional swelling and fullness within the knee.  He denies any night pain.   ALLERGIES:  Penicillin, Vioxx.   CURRENT MEDICATIONS:  1.  Aspirin 81 mg p.o. daily.  2.  Ibuprofen 800 mg p.o. daily.  3.  Prinivil 20 mg p.o. daily.  4.  Maxzide 10 mg p.o. daily.   PAST MEDICAL HISTORY:  1.  Hypertension.  2.  Increased reflux with increasing ibuprofen dosage.  3.  Multiple degenerative joint diseases with totals with shoulder      replacements and previous left knee replacement.   PAST SURGICAL HISTORY:  1.  Tonsillectomy.  2.  Repair of his left hand after a traumatic injury in 1966.  3.  Right knee arthroscopy in 1991.  4.  Left knee arthroscopy in 1984.  5.  Right shoulder arthroscopy with Achilles tendon repair in 1995.  6.  Right shoulder hemiarthroplasty in 1996.  7.  Left total knee arthroplasty in 2001.  8.  Cataract removal.  9.  The patient states the only complications he has had with the  above      surgical procedures, he had a significant panic attack with anesthesia      after his first procedure but subsequent procedures with changing of      medications, he has had no recurrence.  The patient also has a very slow      return of normal bowel habits after anesthesia.   SOCIAL HISTORY:  The patient is a 70 year old white male, tall in stature,  physically fit.  He denies any history of smoking.  He does have about one  alcoholic beverage a day.  He is married, lives with his wife up in  [place].  He is a retired Animator.   FAMILY PHYSICIAN:  Dr. Smitty Cords Ward.   FAMILY MEDICAL HISTORY:  Mother is deceased from complications and treatment  of breast cancer.  Father is deceased at 69 from heart failure, emphysema  and complications of diabetes.  The patient has got  one sister with a  history of diabetes.   REVIEW OF SYSTEMS:  Positive for significant neck pain and loss of range of  motion due to arthritic changes throughout the cervical spine.  He wears  glasses at all times.  He does have increased stomach irritation with  increasing doses of ibuprofen but he is able to tolerate once a day.  The  patient denies any cardiac symptoms but has been told in the past he has had  an irregular EKG findings with extensive work up with the [place] and no  cardiac damage.  The patient has had some slight anemia in the past which  was corrected with iron.  Otherwise, all other categories are negative.   PHYSICAL EXAMINATION:  VITAL SIGNS:  Height is 6 feet, weight is 260 pounds.  Blood pressure is 146/98, pulse is 76 and regular, respirations are 12.  The  patient is afebrile.  GENERAL:  This is a tall statured, healthy-appearing, white male, ambulates  fairly easily with a slight right-sided limp.  He is easily getting on and  off the exam table without any difficulty.  HEENT:  Head was normocephalic.  Pupils equal, round and reactive.  Extraocular movements intact.   Sclerae is no icteric.  External ears are  without deformities.  Gross hearing is intact.  Oral buccal mucosa was pink  and moist.  NECK:  Supple.  No palpable lymphadenopathy.  Thyroid region was nontender.  The patient had significant loss of range of motion.  He was not able to  raise it above the neutral position or rotate it more than 5 degrees to the  right or left due to cervical mechanical blocking and discomfort.  He was  able to touch his chin to his chest.  CHEST:  Lung sounds were clear and equal bilaterally.  No wheezes, rales or  rhonchi.  HEART:  A regular rate and rhythm.  No murmurs, rubs or gallops.  ABDOMEN:  Round, soft, nontender.  Bowel sounds are normoactive.  He had no  CVA region tenderness.  EXTREMITIES:  Upper extremities were symmetric in size and shape. The  patient had significant loss of range of motion of both shoulders.  He was  only able to abduct it to about 60 degrees, right and left due to previous  shoulder procedures.  He had full range of motion of his elbows and wrist.  Motor strength was 5/5.   Lower extremities:  Right and left hip had full extension, flexion up to 120  degrees with 20 degrees internal and external rotation without any  mechanical symptoms.  Right knee was without any signs of effusion.  He had  some coarse crepitus on the patella with full extension and flexion back to  about 95 degrees.  He had about a 5-10 degrees valgus varus laxity.  He was  tender along the medial and lateral joint line.  He had no calf tenderness.  Left knee had well-healed midline surgical incision.  No instability. He had  full extension.  Flexion back to about 100 degrees.  The calf was nontender.  The ankles were symmetrical with good dorsoplantar flexion.   PERIPHERAL VASCULATURE:  Carotid pulses were 2+ for bruits.  Radial pulses were 2+.  Dorsalis pedis pulses were 1+.  He had no lower extremity edema or  venous stasis changes.  NEUROLOGICAL:   The patient was conscious, alert and appropriate, easily  conversation with the examiner.  Cranial nerves II-XII were grossly intact.  He had no gross neurologic defects noted.   IMPRESSION:  1.  End-stage osteoarthritis, right knee.  2.  Degenerative joint disease multiple joints with difficulty with range of      motion.  3.  Hypertension.  4.  Mild reflux disease.   PLAN:  The patient is expected to have a right total knee arthroplasty on  September 02, 2004 by Dr. Norlene Campbell.  The patient will undergo all routine  laboratories and tests prior to having this procedure performed.      RWK/MEDQ  D:  08/18/2004  T:  08/18/2004  Job:  [id number]

## 2010-11-14 NOTE — Op Note (Signed)
NAME:  Barry Curry, Barry Curry                       ACCOUNT NO.:  [id number]   MEDICAL RECORD NO.:  [id number]                   PATIENT TYPE:  INP   LOCATION:  2899                                 FACILITY:  [facility]   PHYSICIAN:  Claude Manges. Cleophas Dunker, M.D.            DATE OF BIRTH:  01-14-1941   DATE OF PROCEDURE:  01/01/2004  DATE OF DISCHARGE:                                 OPERATIVE REPORT   PREOPERATIVE DIAGNOSIS:  End-stage osteoarthritis left shoulder.   POSTOPERATIVE DIAGNOSIS:  End-stage osteoarthritis left shoulder.   PROCEDURE:  Hemiarthroplasty left shoulder.   SURGEON:  Claude Manges. Cleophas Dunker, M.D.   ASSISTANT:  Jamelle Rushing, P.A.   ANESTHESIA:  General with orotracheal.   COMPLICATIONS:  None.   COMPONENTS:  Depuy Global Advantage 12 mm diameter humeral stem with a 56 mm  outer diameter humeral eccentric head, 18 mm neck.   DESCRIPTION OF PROCEDURE:  With the patient comfortable on the operating  table and under general orotracheal anesthesia with supplemental  interscalene nerve block, the patient was placed in a semisitting position  at about a 35 degree angle.  The left shoulder was prepped with Betadine  scrub and Duraprep from the base of the neck circumferentially below the  elbow.  Sterile draping was performed.  Examination of the shoulder revealed  at least 30 to 35 degrees of external rotation.  There was probably 120 to  130 degrees of overhead motion and approximately 80 to 90 degrees of  abduction. There was considerable crepitation.   A deltopectoral groove incision was utilized starting at about the level of  the coracoid extending inferiorly about three inches via sharp dissection.  Incision was carried down to the subcutaneous tissue.  Gross bleeders were  Bovie coagulated.  The deltopectoral groove was easily identified with the  cephalic vein.  The interval was carefully and bluntly dissected such that  we reached the level of the clavipectoral  fascia.  Self-retaining retractor  was inserted.  The conjoin tendon was identified and with the arm externally  rotated, I could visualize the subscapularis.  The clavipectoral fascia was  then incised.  The superior and inferior aspect of the subscapularis was  identified and it was carefully incised longitudinally about 1 cm from its  attachment to the lesser tuberosity.  The capsule was incised with the  subscapularis, but the capsule was excised after release of the  subscapularis.  There was clear yellow joint effusion.  Tagging sutures were  placed at the superior and inferior borders of the subscapularis.  The  subscapularis was then capsule released further superiorly and inferiorly  such that I could now with the external rotation, dislocate the joint so I  could visualize the head.  There were large osteophytes inferiorly from  approximately 4 o'clock to the 8 o'clock position and there was complete  absence of articular cartilage.  I elected to use the intramedullary guide  to  excise the humeral head.  Accordingly, a starter hole was then made at  the center of the superior portion of the humeral head and sequentially  reamed to 12 at which point a very nice fit.  The guide system was then  placed over the reamer and the humeral head was then excised anterior to  posterior with a very nice cut.  As I removed the reamer and the guide and  then reduced the head, it appeared that I had anatomic position of the cut.   The capsule was just released enough that really I could remove the  osteophytes circumferentially above the head.  A very nice visualization of  the head.  The biceps tendon was really quite frayed, so it was incised and  then tenodesed to the humeral head with a Mitek anchor.   The joint was explored and there were no loose bodies.  I did not feel like  the capsule was tight.  The glenoid was not deformed.  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   Rasping was performed sequentially with the 8, 10, and then 12 mm broaches.  I had very nice fit with the broach sitting flush on the humeral head.  I  then trialed several humeral head sizes.  We felt that the eccentric 56 mm  wide diameter with an 18 mm neck length was an excellent fit.  The entire  head was covered.  There was no impingement inferiorly and the head sat  approximately 8 mm above the greater tuberosity.  There was about a 50%  anterior and about a 25% posterior subluxation.  I did not feel like I had  impingement, nor did I feel I had stuffed the joint as I could easily place  the arm at 150 or so degrees of flexion and well over 90 degrees of  abduction.   Accordingly, the trial components were removed.  The joint was copiously  irrigated with saline solution.  The Global Advantage 12 mm stem was then  impacted flush on the humeral head and then the eccentric 56 mm wide  diameter with 18 mm neck was then impacted on the Morris taper construct.  It was nice and tight and quite stable and I completely covered the head  anterior to posterior, superior to inferior.  The joint was then inspected  without evidence of loose material.  The entire construct was then reduced.  The subscapularis was closed with interrupted 0 Ethibond.  The rotator cuff  interval was closed with a similar material.  The wound was again irrigated  with saline solution.  The deltopectoral groove was closed with a running 0  Vicryl.  We did note that the cephalic vein was no longer intact and  accordingly it was sutured with 0 Vicryl.  The subcutaneous was closed with  2-0 Vicryl and skin closed with skin clips.  Sterile bulky dressing was  applied followed by a shoulder immobilizer.  The patient tolerated the  procedure without complications.                                               Claude Manges. Cleophas Dunker, M.D.   PWW/MEDQ  D:  01/01/2004  T:   01/01/2004  Job:  [id number]

## 2010-11-14 NOTE — H&P (Signed)
NAME:  Barry Curry, Barry Curry                       ACCOUNT NO.:  [id number]   MEDICAL RECORD NO.:  [id number]                   PATIENT TYPE:  INP   LOCATION:  NA                                   FACILITY:  [facility]   PHYSICIAN:  Claude Manges. Cleophas Dunker, M.D.            DATE OF BIRTH:  Mar 30, 1941   DATE OF ADMISSION:  01/01/2004  DATE OF DISCHARGE:                                HISTORY & PHYSICAL   CHIEF COMPLAINT:  Left shoulder pain for the last five to six years.   HISTORY OF PRESENT ILLNESS:  This 70 year old white male patient presented  to Dr. Cleophas Dunker with a history of right shoulder hemiarthroplasty in May of  1996 and a left total knee arthroplasty in December of 2001.  He has also  been having a five to six year history of gradual onset of progressively  worsening left shoulder pain.  The pain is now a constant aching sensation  which becomes sharp at times over the deltoid region and under the scapula.  there is no additional radiation of the pain, although he does have  significant arthritis in his neck with some pain at times.  He did not have  any specific injuries but played football for years and had shoulder and  knee injuries when he was younger.  He has had no other surgery to his right  shoulder.  At this time, the shoulder pain increases with any range of  motion of the shoulder or increased use and then decreases with use of  ibuprofen.  The shoulder does feel like it clicks at times.  He does have  decreased range of motion but he can sleep on that left side.  There is no  popping, grinding or locking.  He has received cortisone injection in that  left shoulder in the past and that did help some but he cannot remember the  last time he had an injection.   ALLERGIES:  1. PENICILLIN.  2. VIOXX caused severe rash.   CURRENT MEDICATIONS:  1. Lisinopril 20 mg one tablet p.o. q.a.m.  2. Triamterene/hydrochlorothiazide 37.5/25 mg half tablet p.o. q.a.m.  3. Aspirin 81 mg  p.o. q.a.m.  4. Ibuprofen 800 mg one tablet p.o. q.a.m. and then additional 200 mg of     ibuprofen p.r.n. throughout the day.   PAST MEDICAL HISTORY:  1. Hypertension diagnosed 10 to 15 years ago.  He is relatively stable on     his blood pressure medications.  2. Mild gastroesophageal reflux disease.  3. Osteoarthritis.  4. He denies any history of diabetes mellitus, thyroid disease, hiatal     hernia, peptic ulcer disease, heart disease, asthma, or any other chronic     medical condition other than noted previously.   PAST SURGICAL HISTORY:  1. Tonsillectomy.  2. 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  3. Right knee arthroscopy in November 1991 by Claude Manges. Cleophas Dunker, M.D.  4. Left knee arthroscopy in 1984 by Claude Manges. Whitfield, M.D.  5. Right shoulder arthroscopy and repair of Achilles tendon, November 1995.  6. Right shoulder hemiarthroplasty by Claude Manges. Cleophas Dunker, M.D., Nov 10, 1994.  7. Left total knee arthroplasty by Claude Manges. Cleophas Dunker, M.D., January 09, 2000.  8. Cataract removal.   The only complication he reports from surgery is some severe feelings of  claustrophobia.  No other complications noted.   SOCIAL HISTORY:  Denies any history of cigarette smoking or drug use.  He  does drink one mixed drink a day.  He is married and has two daughters.  He  and his wife live in a one-story house with five steps into the main  entrance.  He will be recovering up at [facility] in a house that  has only one step into the main entrance.  He currently works as a Scientist, research (physical sciences).  Primary medical doctor is Bruce H. Swords, M.D., at [facility] and his phone number is [phone].   FAMILY HISTORY:  Mother died at the age of 8 with history of breast cancer  and complications from radiation therapy. Father died at the age of 64 with  congestive heart failure,  myocardial infarction, diabetes and emphysema.  He  has one sister alive at age 18 who is obese with diabetes and  osteoarthritis.  His daughters are ages 66 and 44 and they are alive and  well.   REVIEW OF SYMPTOMS:  He does wear glasses.  He had a history of a possible  MI noted on a stress test which was evaluated by [facility] and they  reported that it actually was not an MI, it was just due to the large size  of his heart.  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  He complains of some excess bone in his gum line.  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  He  has some dyspnea on exertion with hills and stairs.  He does have arthritis  in the right knee and wants to have a knee replacement in the future.  He  has gained 10 to 15 pounds over the last year.  He does have a living will  and his power of attorney is his wife, Johnrobert Foti.  All other systems  are negative and noncontributory.   PHYSICAL EXAMINATION:  GENERAL APPEARANCE:  A well-developed, well-  nourished, overweight white male in no acute distress.  Mood and affect are  appropriate.  Talks easily with the examiner.  Walks without a limp.  VITAL SIGNS:  Height 6 feet 0 inches, weight 255 pounds, BMI is 34.  Temperature 96.8 degrees F., pulse 72, respiratory rate 18, and blood  pressure 124/92.  HEENT:  Normocephalic and atraumatic without frontal or maxillary sinus  tenderness to palpation.  Conjunctivae are pink. Sclerae are anicteric.  PERRLA.  EOM intact.  No visible external ear deformities.  Hearing grossly  intact.  Tympanic membranes pearly gray bilaterally with good light reflex.  Nose and nasal septum midline. Nasal mucosa pink and moist without exudates  or polyps noted.  Buccal mucosa pink and moist.  Good dentition.  Pharynx without erythema or exudate.  Tongue and uvula  midline.  Tongue without  fasciculations and uvula  rises equally with phonation.  NECK:  No visible masses or lesions noted.  Trachea midline.  No palpable  lymphadenopathy nor thyromegaly.  Carotids +2 bilaterally without bruits.  He has good flexion of his neck but has markedly decreased extension with  rotation only 15 to 20 degrees to either side and lateral bending only 10 to  15 degrees to either side.  There is some pain with that but it is minimal.  No pain with palpation about the neck.  LUNGS:  Respirations even and unlabored.  Breath sounds clear to  auscultation bilaterally without rales or wheezes noted.  CARDIOVASCULAR:  Heart with regular rate and rhythm.  S1 and S2 present  without rubs, clicks or murmurs noted.  ABDOMEN:  Rounded abdominal contour.  Bowel sounds present x4 quadrants.  Soft, nontender to palpation without hepatosplenomegaly nor CVA tenderness.  Femoral pulses +2 bilaterally.  Nontender to palpation along the vertebral  column.  BREASTS/GU/RECTAL:  These examinations are deferred at this time.  MUSCULOSKELETAL:  He has no obvious deformities bilateral hips, ankles and  toes.  DP and PT pulses  are +2.  No lower extremity edema.  He does have a  well-healed left knee incision line right in the midline.  He has full  extension and flexion to 105 degrees with a crepitus.  No pain with  palpation about the knee.  Right knee skin is intact without erythema or  ecchymosis.  Some scattered old scars noted about the knee but he has full  extension and flexion to 120 degrees with a moderate amount of crepitus.  No  pain with palpation along the joint line.  No effusion.  Stable to varus and  valgus stress.  He has no calf pain with palpation bilaterally and negative  Homan's sign bilaterally.  He has full range of motion of his elbows, wrists  and fingers bilaterally.  His left index finger has been partially  amputated.  It appears to be amputated right at the mid shaft of the IP  joint.  He does have a well-healed  scar in the web space of his left thumb.  Otherwise, has full range of motion of his fingers without pain.  Radial  pulses are +2.  Right shoulder has no pain with palpation about the shoulder  but a well-healed anterior shoulder incision line.  He has forward flexion  of that shoulder to 90 degrees, abduction to 90 degrees, more decreased  external rotation than internal rotation but both motions are decreased than  normal.  There is no pain with range of motion at this time and no pain with  palpation about the shoulder.  Left shoulder skin is intact without erythema  or ecchymosis.  No pain with palpation about the [facility] joint and bicipital  tendon.  He has forward flexion of the left shoulder to 90 degrees,  abduction only to about 60 or 70 degrees and both of these cause pain.  He  has probably 20 degrees of external rotation, about the same internal rotation but this does cause pain and a little bit of crepitus.  NEUROLOGIC:  Alert and oriented x3.  Cranial nerves II-XII grossly intact.  Strength 5/5 bilateral upper and lower extremities.  Rapid alternating  movements intact.  Deep tendon reflexes 2+ bilateral upper and lower  extremities.  Sensation intact to light touch.   RADIOLOGIC FINDINGS:  X-rays taken of his left shoulder in June of  2005,  showed considerable end-stage osteoarthritis of the shoulder but the glenoid  appears to be in good repair.  He does have a large inferior humeral head  spur.   IMPRESSION:  1. End-stage osteoarthritis of the left shoulder.  2. History of osteoarthritis bilateral knees and right shoulder, status post     right shoulder hemiarthroplasty and left total knee arthroplasty.  3. Hypertension.  4. Gastroesophageal reflux disease.   PLAN:  Mr. Macomber will be admitted to [facility] on  January 01, 2004, where he will undergo a left shoulder hemiarthroplasty by  Claude Manges. Cleophas Dunker, M.D.  He will undergo all the routine  preoperative  laboratory tests and studies prior to this procedure.  If he has any medical  issues while he is hospitalized, we will consult either [facility]  or the [facility].      Legrand Pitts Duffy, P.A.                      Claude Manges. Cleophas Dunker, M.D.    KED/MEDQ  D:  12/25/2003  T:  12/25/2003  Job:  [id number]

## 2011-03-30 LAB — BASIC METABOLIC PANEL
BUN: 16
BUN: 7
CO2: 26
CO2: 29
Calcium: 8 — ABNORMAL LOW
Calcium: 8 — ABNORMAL LOW
Chloride: 102
Chloride: 105
Chloride: 106
Creatinine, Ser: 0.88
Creatinine, Ser: 1.06
GFR calc Af Amer: >60
GFR calc Af Amer: >60
GFR calc Af Amer: >60
GFR calc Af Amer: >60
GFR calc non Af Amer: >60
GFR calc non Af Amer: >60
GFR calc non Af Amer: >60
GFR calc non Af Amer: >60
Glucose, Bld: 87
Glucose, Bld: 97
Potassium: 3.1 — ABNORMAL LOW
Potassium: 3.4 — ABNORMAL LOW
Potassium: 3.5
Potassium: 3.8
Sodium: 136
Sodium: 137
Sodium: 139

## 2011-03-30 LAB — CBC
HCT: 30.2 — ABNORMAL LOW
HCT: 30.6 — ABNORMAL LOW
HCT: 30.7 — ABNORMAL LOW
HCT: 33.5 — ABNORMAL LOW
HCT: 36.1 — ABNORMAL LOW
Hemoglobin: 10.4 — ABNORMAL LOW
Hemoglobin: 10.4 — ABNORMAL LOW
Hemoglobin: 10.4 — ABNORMAL LOW
Hemoglobin: 11.5 — ABNORMAL LOW
Hemoglobin: 12.2 — ABNORMAL LOW
MCHC: 33.9
MCHC: 33.9
MCHC: 34
MCHC: 34.4
MCHC: 34.5
MCV: 91
MCV: 91.4
MCV: 91.7
MCV: 91.9
MCV: 92
MCV: 92.7
Platelets: 107 — ABNORMAL LOW
Platelets: 137 — ABNORMAL LOW
Platelets: 167
Platelets: 98 — ABNORMAL LOW
Platelets: 98 — ABNORMAL LOW
RBC: 3.23 — ABNORMAL LOW
RBC: 3.3 — ABNORMAL LOW
RBC: 3.32 — ABNORMAL LOW
RBC: 3.35 — ABNORMAL LOW
RBC: 3.66 — ABNORMAL LOW
RBC: 3.93 — ABNORMAL LOW
RDW: 13
RDW: 13.1
RDW: 13.1
RDW: 13.2
RDW: 13.3
WBC: 13.9 — ABNORMAL HIGH
WBC: 7.1
WBC: 7.6
WBC: 7.7
WBC: 7.8
WBC: 8.9

## 2011-03-30 LAB — CARDIAC PANEL(CRET KIN+CKTOT+MB+TROPI)
CK, MB: 8 — ABNORMAL HIGH
Relative Index: 1.1
Total CK: 749 — ABNORMAL HIGH
Troponin I: 0.01

## 2011-03-30 LAB — DIFFERENTIAL
Basophils Absolute: 0
Basophils Relative: 0
Eosinophils Absolute: 0.1
Eosinophils Relative: 1
Lymphocytes Relative: 5 — ABNORMAL LOW
Lymphs Abs: 0.5 — ABNORMAL LOW
Monocytes Absolute: 0.9
Monocytes Relative: 11
Neutro Abs: 7.4
Neutrophils Relative %: 83 — ABNORMAL HIGH

## 2011-03-30 LAB — COMPREHENSIVE METABOLIC PANEL
ALT: 22
AST: 27
Albumin: 3 — ABNORMAL LOW
Alkaline Phosphatase: 42
BUN: 13
CO2: 27
Calcium: 8.3 — ABNORMAL LOW
Chloride: 106
Creatinine, Ser: 0.94
GFR calc Af Amer: >60
GFR calc non Af Amer: >60
Glucose, Bld: 115 — ABNORMAL HIGH
Potassium: 4.3
Sodium: 138
Total Bilirubin: 1
Total Protein: 4.9 — ABNORMAL LOW

## 2011-03-30 LAB — PROTIME-INR
INR: 1.4
INR: 1.4
INR: 1.5
INR: 1.7 — ABNORMAL HIGH
Prothrombin Time: 17.2 — ABNORMAL HIGH
Prothrombin Time: 17.5 — ABNORMAL HIGH
Prothrombin Time: 20.3 — ABNORMAL HIGH

## 2011-03-30 LAB — MAGNESIUM: Magnesium: 1.8

## 2011-03-30 LAB — APTT: aPTT: 45 — ABNORMAL HIGH

## 2011-04-01 LAB — CROSSMATCH
ABO/RH(D): A POS
Antibody Screen: NEGATIVE

## 2011-04-01 LAB — DIFFERENTIAL
Basophils Absolute: 0
Basophils Relative: 0
Eosinophils Absolute: 0.1
Eosinophils Relative: 2
Lymphocytes Relative: 16
Monocytes Absolute: 1

## 2011-04-01 LAB — COMPREHENSIVE METABOLIC PANEL
ALT: 20
AST: 21
Alkaline Phosphatase: 62
CO2: 27
Chloride: 104
Creatinine, Ser: 0.94
GFR calc Af Amer: >60
GFR calc non Af Amer: >60
Potassium: 4.2
Total Bilirubin: 1

## 2011-04-01 LAB — URINE CULTURE: Colony Count: 15000

## 2011-04-01 LAB — CBC
MCV: 91.3
RBC: 5.2
WBC: 8.1

## 2011-04-01 LAB — URINALYSIS, ROUTINE W REFLEX MICROSCOPIC
Bilirubin Urine: NEGATIVE
Glucose, UA: NEGATIVE
Hgb urine dipstick: NEGATIVE
Ketones, ur: NEGATIVE
Protein, ur: NEGATIVE
pH: 6.5

## 2011-04-01 LAB — PROTIME-INR: Prothrombin Time: 12.9

## 2012-07-11 ENCOUNTER — Ambulatory Visit
Admission: RE | Admit: 2012-07-11 | Discharge: 2012-07-11 | Disposition: A | Discharge status: Home / Self Care | Payer: Medicare Other | Source: Ambulatory Visit | Attending: Orthopaedic Surgery | Admitting: Orthopaedic Surgery

## 2012-07-11 ENCOUNTER — Other Ambulatory Visit: Payer: Self-pay | Admitting: Orthopaedic Surgery

## 2012-07-11 DIAGNOSIS — M545 Low back pain, unspecified: Secondary | ICD-10-CM

## 2012-07-11 DIAGNOSIS — I700 Atherosclerosis of aorta: Secondary | ICD-10-CM

## 2012-07-12 ENCOUNTER — Other Ambulatory Visit: Payer: Self-pay

## 2012-07-13 ENCOUNTER — Other Ambulatory Visit: Payer: Self-pay | Admitting: Orthopaedic Surgery

## 2012-07-13 DIAGNOSIS — M549 Dorsalgia, unspecified: Secondary | ICD-10-CM

## 2012-07-15 ENCOUNTER — Ambulatory Visit
Admission: RE | Admit: 2012-07-15 | Discharge: 2012-07-15 | Disposition: A | Discharge status: Home / Self Care | Payer: Medicare Other | Source: Ambulatory Visit | Attending: Orthopaedic Surgery | Admitting: Orthopaedic Surgery

## 2012-07-15 VITALS — BP 177/96 | HR 70 | Ht 72.0 in | Wt 237.0 lb

## 2012-07-15 DIAGNOSIS — M549 Dorsalgia, unspecified: Secondary | ICD-10-CM

## 2012-07-15 MED ORDER — IOHEXOL 180 MG/ML  SOLN
1.0000 mL | Freq: Once | INTRAMUSCULAR | Status: AC | PRN
  Administered 2012-07-15: 1 mL via EPIDURAL

## 2012-07-15 MED ORDER — METHYLPREDNISOLONE ACETATE 40 MG/ML INJ SUSP (RADIOLOG
120.0000 mg | Freq: Once | INTRAMUSCULAR | Status: AC
  Administered 2012-07-15: 120 mg via EPIDURAL

## 2012-07-28 ENCOUNTER — Other Ambulatory Visit: Payer: Self-pay | Admitting: Orthopaedic Surgery

## 2012-07-28 DIAGNOSIS — M5126 Other intervertebral disc displacement, lumbar region: Secondary | ICD-10-CM

## 2012-07-29 ENCOUNTER — Ambulatory Visit
Admission: RE | Admit: 2012-07-29 | Discharge: 2012-07-29 | Disposition: A | Discharge status: Home / Self Care | Payer: Medicare Other | Source: Ambulatory Visit | Attending: Orthopaedic Surgery | Admitting: Orthopaedic Surgery

## 2012-07-29 VITALS — BP 177/90 | HR 63

## 2012-07-29 DIAGNOSIS — M5126 Other intervertebral disc displacement, lumbar region: Secondary | ICD-10-CM

## 2012-07-29 MED ORDER — METHYLPREDNISOLONE ACETATE 40 MG/ML INJ SUSP (RADIOLOG
120.0000 mg | Freq: Once | INTRAMUSCULAR | Status: AC
  Administered 2012-07-29: 120 mg via EPIDURAL

## 2012-07-29 MED ORDER — IOHEXOL 180 MG/ML  SOLN
1.0000 mL | Freq: Once | INTRAMUSCULAR | Status: AC | PRN
  Administered 2012-07-29: 1 mL via EPIDURAL

## 2012-08-08 ENCOUNTER — Other Ambulatory Visit: Payer: Self-pay | Admitting: Orthopaedic Surgery

## 2012-08-08 DIAGNOSIS — M5126 Other intervertebral disc displacement, lumbar region: Secondary | ICD-10-CM

## 2012-08-11 ENCOUNTER — Inpatient Hospital Stay: Admission: RE | Admit: 2012-08-11 | Payer: Medicare Other | Source: Ambulatory Visit

## 2012-08-19 ENCOUNTER — Ambulatory Visit
Admission: RE | Admit: 2012-08-19 | Discharge: 2012-08-19 | Disposition: A | Discharge status: Home / Self Care | Payer: Medicare Other | Source: Ambulatory Visit | Attending: Orthopaedic Surgery | Admitting: Orthopaedic Surgery

## 2012-08-19 ENCOUNTER — Other Ambulatory Visit: Payer: Self-pay | Admitting: Orthopaedic Surgery

## 2012-08-19 DIAGNOSIS — M5126 Other intervertebral disc displacement, lumbar region: Secondary | ICD-10-CM

## 2012-08-19 MED ORDER — METHYLPREDNISOLONE ACETATE 40 MG/ML INJ SUSP (RADIOLOG
120.0000 mg | Freq: Once | INTRAMUSCULAR | Status: AC
  Administered 2012-08-19: 120 mg via EPIDURAL

## 2012-08-19 MED ORDER — IOHEXOL 180 MG/ML  SOLN
1.0000 mL | Freq: Once | INTRAMUSCULAR | Status: AC | PRN
  Administered 2012-08-19: 1 mL via EPIDURAL

## 2017-06-04 ENCOUNTER — Other Ambulatory Visit: Payer: Self-pay | Admitting: Urology

## 2017-07-15 NOTE — Patient Instructions (Addendum)
Your procedure is scheduled on: Friday, Jan. 25, 2019   Surgery Time:  11:00AM-1400AM   Report to [place]  Entrance    Report to admitting at 9:00 AM   Call this number if you have problems the morning of surgery [phone]   Begin a clear liquid diet the day before surgery:  CLEAR LIQUID DIET   Foods Allowed                                                                     Foods Excluded  Coffee and tea, regular and decaf                             liquids that you cannot  Plain Jell-O in any flavor                                             see through such as: Fruit ices (not with fruit pulp)                                     milk, soups, orange juice  Iced Popsicles                                    All solid food Carbonated beverages, regular and diet                                    Cranberry, grape and apple juices Sports drinks like Gatorade Lightly seasoned clear broth or consume(fat free) Sugar, honey syrup  Sample Menu Breakfast                                Lunch                                     Supper Cranberry juice                    Beef broth                            Chicken broth Jell-O                                     Grape juice                           Apple juice Coffee or tea                        Jell-O  Popsicle                                                Coffee or tea                        Coffee or tea   Magnesium Citrate solution 1 Bottle:  Drink by noon the day before surgery.   Do not eat food or drink liquids :After Midnight.   Do NOT smoke after Midnight   Take these medicines the morning of surgery with A SIP OF WATER: Pantoprazole              You may not have any metal on your body including jewelry, and body piercings             Do not wear lotions, powders, perfumes/cologne, or deodorant                          Men may shave face and neck.   Do not  bring valuables to the hospital. [place].   Contacts, dentures or bridgework may not be worn into surgery.   Leave suitcase in the car. After surgery it may be brought to your room.   Special Instructions: Bring a copy of your healthcare power of attorney and living will documents         the day of surgery if you haven't scanned them in before.              Please read over the following fact sheets you were given:   [facility] - Preparing for Surgery Before surgery, you can play an important role.  Because skin is not sterile, your skin needs to be as free of germs as possible.  You can reduce the number of germs on your skin by washing with CHG (chlorahexidine gluconate) soap before surgery.  CHG is an antiseptic cleaner which kills germs and bonds with the skin to continue killing germs even after washing. Please DO NOT use if you have an allergy to CHG or antibacterial soaps.  If your skin becomes reddened/irritated stop using the CHG and inform your nurse when you arrive at Short Stay. Do not shave (including legs and underarms) for at least 48 hours prior to the first CHG shower.  You may shave your face/neck.  Please follow these instructions carefully:  1.  Shower with CHG Soap the night before surgery and the  morning of surgery.  2.  If you choose to wash your hair, wash your hair first as usual with your normal  shampoo.  3.  After you shampoo, rinse your hair and body thoroughly to remove the shampoo.                             4.  Use CHG as you would any other liquid soap.  You can apply chg directly to the skin and wash.  Gently with a scrungie or clean washcloth.  5.  Apply the CHG Soap to your body ONLY FROM THE NECK DOWN.   Do   not use on face/ open  Wound or open sores. Avoid contact with eyes, ears mouth and   genitals (private parts).                       Wash face,  Genitals (private parts)  with your normal soap.             6.  Wash thoroughly, paying special attention to the area where your    surgery  will be performed.  7.  Thoroughly rinse your body with warm water from the neck down.  8.  DO NOT shower/wash with your normal soap after using and rinsing off the CHG Soap.                9.  Pat yourself dry with a clean towel.            10.  Wear clean pajamas.            11.  Place clean sheets on your bed the night of your first shower and do not  sleep with pets. Day of Surgery : Do not apply any lotions/deodorants the morning of surgery.  Please wear clean clothes to the hospital/surgery center.  FAILURE TO FOLLOW THESE INSTRUCTIONS MAY RESULT IN THE CANCELLATION OF YOUR SURGERY  PATIENT SIGNATURE_________________________________  NURSE SIGNATURE__________________________________  ________________________________________________________________________   Adam Phenix  An incentive spirometer is a tool that can help keep your lungs clear and active. This tool measures how well you are filling your lungs with each breath. Taking long deep breaths may help reverse or decrease the chance of developing breathing (pulmonary) problems (especially infection) following:  A long period of time when you are unable to move or be active. BEFORE THE PROCEDURE   If the spirometer includes an indicator to show your best effort, your nurse or respiratory therapist will set it to a desired goal.  If possible, sit up straight or lean slightly forward. Try not to slouch.  Hold the incentive spirometer in an upright position. INSTRUCTIONS FOR USE  1. Sit on the edge of your bed if possible, or sit up as far as you can in bed or on a chair. 2. Hold the incentive spirometer in an upright position. 3. Breathe out normally. 4. Place the mouthpiece in your mouth and seal your lips tightly around it. 5. Breathe in slowly and as deeply as possible, raising the piston or the  ball toward the top of the column. 6. Hold your breath for 3-5 seconds or for as long as possible. Allow the piston or ball to fall to the bottom of the column. 7. Remove the mouthpiece from your mouth and breathe out normally. 8. Rest for a few seconds and repeat Steps 1 through 7 at least 10 times every 1-2 hours when you are awake. Take your time and take a few normal breaths between deep breaths. 9. The spirometer may include an indicator to show your best effort. Use the indicator as a goal to work toward during each repetition. 10. After each set of 10 deep breaths, practice coughing to be sure your lungs are clear. If you have an incision (the cut made at the time of surgery), support your incision when coughing by placing a pillow or rolled up towels firmly against it. Once you are able to get out of bed, walk around indoors and cough well. You may stop using the incentive spirometer when instructed by your caregiver.  RISKS AND COMPLICATIONS  Take your  time so you do not get dizzy or light-headed.  If you are in pain, you may need to take or ask for pain medication before doing incentive spirometry. It is harder to take a deep breath if you are having pain. AFTER USE  Rest and breathe slowly and easily.  It can be helpful to keep track of a log of your progress. Your caregiver can provide you with a simple table to help with this. If you are using the spirometer at home, follow these instructions: [place] IF:   You are having difficultly using the spirometer.  You have trouble using the spirometer as often as instructed.  Your pain medication is not giving enough relief while using the spirometer.  You develop fever of 100.5 F (38.1 C) or higher. SEEK IMMEDIATE MEDICAL CARE IF:   You cough up bloody sputum that had not been present before.  You develop fever of 102 F (38.9 C) or greater.  You develop worsening pain at or near the incision site. MAKE SURE YOU:    Understand these instructions.  Will watch your condition.  Will get help right away if you are not doing well or get worse. Document Released: 10/26/2006 Document Revised: 09/07/2011 Document Reviewed: 12/27/2006 [facility] Patient Information 2014 [place], [place].   ________________________________________________________________________

## 2017-07-19 ENCOUNTER — Other Ambulatory Visit: Payer: Self-pay

## 2017-07-19 ENCOUNTER — Encounter (HOSPITAL_COMMUNITY)
Admission: RE | Admit: 2017-07-19 | Discharge: 2017-07-19 | Disposition: A | Discharge status: Home / Self Care | Payer: Medicare Other | Source: Ambulatory Visit | Attending: Urology | Admitting: Urology

## 2017-07-19 ENCOUNTER — Encounter (INDEPENDENT_AMBULATORY_CARE_PROVIDER_SITE_OTHER): Payer: Self-pay

## 2017-07-19 ENCOUNTER — Encounter (HOSPITAL_COMMUNITY): Payer: Self-pay

## 2017-07-19 DIAGNOSIS — N2889 Other specified disorders of kidney and ureter: Secondary | ICD-10-CM | POA: Insufficient documentation

## 2017-07-19 DIAGNOSIS — Z01818 Encounter for other preprocedural examination: Secondary | ICD-10-CM | POA: Diagnosis present

## 2017-07-19 HISTORY — DX: Bifascicular block: I45.2

## 2017-07-19 HISTORY — DX: Rheumatic mitral stenosis: I05.0

## 2017-07-19 HISTORY — DX: Personal history of other mental and behavioral disorders: Z86.59

## 2017-07-19 HISTORY — DX: Cardiac murmur, unspecified: R01.1

## 2017-07-19 HISTORY — DX: Other complications of anesthesia, initial encounter: T88.59XA

## 2017-07-19 HISTORY — DX: Nonrheumatic aortic (valve) stenosis: I35.0

## 2017-07-19 HISTORY — DX: Essential (primary) hypertension: I10

## 2017-07-19 HISTORY — DX: Personal history of other diseases of the circulatory system: Z86.79

## 2017-07-19 HISTORY — DX: Ventricular premature depolarization: I49.3

## 2017-07-19 HISTORY — DX: Cardiomyopathy, unspecified: I42.9

## 2017-07-19 HISTORY — DX: Major depressive disorder, single episode, unspecified: F32.9

## 2017-07-19 HISTORY — DX: Atherosclerotic heart disease of native coronary artery without angina pectoris: I25.10

## 2017-07-19 HISTORY — DX: Localized edema: R60.0

## 2017-07-19 HISTORY — DX: Anemia, unspecified: D64.9

## 2017-07-19 HISTORY — DX: Hyperlipidemia, unspecified: E78.5

## 2017-07-19 HISTORY — DX: Anxiety disorder, unspecified: F41.9

## 2017-07-19 HISTORY — DX: Unspecified osteoarthritis, unspecified site: M19.90

## 2017-07-19 HISTORY — DX: Dyspnea, unspecified: R06.00

## 2017-07-19 HISTORY — DX: Adverse effect of unspecified anesthetic, initial encounter: T41.45XA

## 2017-07-19 HISTORY — DX: Malignant neoplasm of unspecified kidney, except renal pelvis: C64.9

## 2017-07-19 HISTORY — DX: Esophageal obstruction: K22.2

## 2017-07-19 HISTORY — DX: Other specified disorders of kidney and ureter: N28.89

## 2017-07-19 HISTORY — DX: Malignant (primary) neoplasm, unspecified: C80.1

## 2017-07-19 HISTORY — DX: Depression, unspecified: F32.A

## 2017-07-19 LAB — CBC
HCT: 34.6 % — ABNORMAL LOW (ref 39.0–52.0)
HEMOGLOBIN: 11 g/dL — AB (ref 13.0–17.0)
MCH: 26.2 pg (ref 26.0–34.0)
MCHC: 31.8 g/dL (ref 30.0–36.0)
MCV: 82.4 fL (ref 78.0–100.0)
PLATELETS: 185 10*3/uL (ref 150–400)
RBC: 4.2 MIL/uL — ABNORMAL LOW (ref 4.22–5.81)
RDW: 14.5 % (ref 11.5–15.5)
WBC: 7.4 10*3/uL (ref 4.0–10.5)

## 2017-07-19 LAB — ABO/RH: ABO/RH(D): A POS

## 2017-07-19 MED ORDER — MAGNESIUM CITRATE PO SOLN
1.0000 | Freq: Once | ORAL | Status: DC
  Filled 2017-07-19: qty 296

## 2017-07-19 NOTE — Pre-Procedure Instructions (Addendum)
Last office note and cardiac clearance 07/05/17 in chart.

## 2017-07-19 NOTE — Pre-Procedure Instructions (Signed)
Last EKG 03/22/17 in chart

## 2017-07-23 ENCOUNTER — Encounter (HOSPITAL_COMMUNITY)
Admission: RE | Disposition: A | Discharge status: Home / Self Care | Payer: Self-pay | Source: Ambulatory Visit | Attending: Urology

## 2017-07-23 ENCOUNTER — Inpatient Hospital Stay (HOSPITAL_COMMUNITY): Payer: Medicare Other

## 2017-07-23 ENCOUNTER — Encounter (HOSPITAL_COMMUNITY): Payer: Self-pay | Admitting: *Deleted

## 2017-07-23 ENCOUNTER — Inpatient Hospital Stay (HOSPITAL_COMMUNITY)
Admission: RE | Admit: 2017-07-23 | Discharge: 2017-07-26 | DRG: 656 | Disposition: A | Discharge status: Home / Self Care | Payer: Medicare Other | Source: Ambulatory Visit | Attending: Urology | Admitting: Urology

## 2017-07-23 ENCOUNTER — Other Ambulatory Visit: Payer: Self-pay

## 2017-07-23 ENCOUNTER — Inpatient Hospital Stay (HOSPITAL_COMMUNITY): Payer: Medicare Other | Admitting: Anesthesiology

## 2017-07-23 DIAGNOSIS — I2510 Atherosclerotic heart disease of native coronary artery without angina pectoris: Secondary | ICD-10-CM | POA: Diagnosis present

## 2017-07-23 DIAGNOSIS — Z96611 Presence of right artificial shoulder joint: Secondary | ICD-10-CM | POA: Diagnosis present

## 2017-07-23 DIAGNOSIS — T8182XA Emphysema (subcutaneous) resulting from a procedure, initial encounter: Secondary | ICD-10-CM | POA: Diagnosis not present

## 2017-07-23 DIAGNOSIS — J989 Respiratory disorder, unspecified: Secondary | ICD-10-CM | POA: Diagnosis not present

## 2017-07-23 DIAGNOSIS — I080 Rheumatic disorders of both mitral and aortic valves: Secondary | ICD-10-CM | POA: Diagnosis present

## 2017-07-23 DIAGNOSIS — N2889 Other specified disorders of kidney and ureter: Secondary | ICD-10-CM | POA: Diagnosis present

## 2017-07-23 DIAGNOSIS — T797XXA Traumatic subcutaneous emphysema, initial encounter: Secondary | ICD-10-CM | POA: Diagnosis present

## 2017-07-23 DIAGNOSIS — S1121XA Laceration without foreign body of pharynx and cervical esophagus, initial encounter: Secondary | ICD-10-CM

## 2017-07-23 DIAGNOSIS — Z86711 Personal history of pulmonary embolism: Secondary | ICD-10-CM | POA: Diagnosis present

## 2017-07-23 DIAGNOSIS — Z79899 Other long term (current) drug therapy: Secondary | ICD-10-CM

## 2017-07-23 DIAGNOSIS — R490 Dysphonia: Secondary | ICD-10-CM | POA: Diagnosis not present

## 2017-07-23 DIAGNOSIS — F410 Panic disorder [episodic paroxysmal anxiety] without agoraphobia: Secondary | ICD-10-CM | POA: Diagnosis present

## 2017-07-23 DIAGNOSIS — C642 Malignant neoplasm of left kidney, except renal pelvis: Principal | ICD-10-CM | POA: Diagnosis present

## 2017-07-23 DIAGNOSIS — Z905 Acquired absence of kidney: Secondary | ICD-10-CM

## 2017-07-23 DIAGNOSIS — E785 Hyperlipidemia, unspecified: Secondary | ICD-10-CM | POA: Diagnosis present

## 2017-07-23 DIAGNOSIS — N281 Cyst of kidney, acquired: Secondary | ICD-10-CM | POA: Diagnosis present

## 2017-07-23 DIAGNOSIS — I10 Essential (primary) hypertension: Secondary | ICD-10-CM | POA: Diagnosis present

## 2017-07-23 DIAGNOSIS — Z880 Allergy status to penicillin: Secondary | ICD-10-CM

## 2017-07-23 DIAGNOSIS — I429 Cardiomyopathy, unspecified: Secondary | ICD-10-CM | POA: Diagnosis present

## 2017-07-23 DIAGNOSIS — J982 Interstitial emphysema: Secondary | ICD-10-CM | POA: Diagnosis not present

## 2017-07-23 DIAGNOSIS — D649 Anemia, unspecified: Secondary | ICD-10-CM | POA: Diagnosis present

## 2017-07-23 DIAGNOSIS — K222 Esophageal obstruction: Secondary | ICD-10-CM | POA: Diagnosis present

## 2017-07-23 DIAGNOSIS — J9601 Acute respiratory failure with hypoxia: Secondary | ICD-10-CM | POA: Diagnosis not present

## 2017-07-23 DIAGNOSIS — I480 Paroxysmal atrial fibrillation: Secondary | ICD-10-CM | POA: Diagnosis not present

## 2017-07-23 DIAGNOSIS — Z96653 Presence of artificial knee joint, bilateral: Secondary | ICD-10-CM | POA: Diagnosis present

## 2017-07-23 DIAGNOSIS — Z96643 Presence of artificial hip joint, bilateral: Secondary | ICD-10-CM | POA: Diagnosis present

## 2017-07-23 DIAGNOSIS — X58XXXA Exposure to other specified factors, initial encounter: Secondary | ICD-10-CM | POA: Diagnosis not present

## 2017-07-23 DIAGNOSIS — J988 Other specified respiratory disorders: Secondary | ICD-10-CM

## 2017-07-23 DIAGNOSIS — Z96612 Presence of left artificial shoulder joint: Secondary | ICD-10-CM | POA: Diagnosis present

## 2017-07-23 DIAGNOSIS — Z7901 Long term (current) use of anticoagulants: Secondary | ICD-10-CM | POA: Diagnosis not present

## 2017-07-23 DIAGNOSIS — I452 Bifascicular block: Secondary | ICD-10-CM | POA: Diagnosis present

## 2017-07-23 DIAGNOSIS — Z87891 Personal history of nicotine dependence: Secondary | ICD-10-CM

## 2017-07-23 DIAGNOSIS — F329 Major depressive disorder, single episode, unspecified: Secondary | ICD-10-CM | POA: Diagnosis present

## 2017-07-23 DIAGNOSIS — R0602 Shortness of breath: Secondary | ICD-10-CM

## 2017-07-23 DIAGNOSIS — T884XXA Failed or difficult intubation, initial encounter: Secondary | ICD-10-CM | POA: Diagnosis not present

## 2017-07-23 DIAGNOSIS — Z85820 Personal history of malignant melanoma of skin: Secondary | ICD-10-CM

## 2017-07-23 DIAGNOSIS — Z7982 Long term (current) use of aspirin: Secondary | ICD-10-CM

## 2017-07-23 DIAGNOSIS — C775 Secondary and unspecified malignant neoplasm of intrapelvic lymph nodes: Secondary | ICD-10-CM | POA: Diagnosis present

## 2017-07-23 HISTORY — PX: ROBOT ASSISTED LAPAROSCOPIC NEPHRECTOMY: SHX5140

## 2017-07-23 LAB — HEMOGLOBIN AND HEMATOCRIT, BLOOD
HCT: 31.2 % — ABNORMAL LOW (ref 39.0–52.0)
Hemoglobin: 9.9 g/dL — ABNORMAL LOW (ref 13.0–17.0)

## 2017-07-23 LAB — TYPE AND SCREEN
ABO/RH(D): A POS
ANTIBODY SCREEN: NEGATIVE

## 2017-07-23 SURGERY — NEPHRECTOMY, RADICAL, ROBOT-ASSISTED, LAPAROSCOPIC, ADULT
Anesthesia: General | Laterality: Left

## 2017-07-23 MED ORDER — LIDOCAINE 2% (20 MG/ML) 5 ML SYRINGE
INTRAMUSCULAR | Status: DC | PRN
  Administered 2017-07-23: 50 mg via INTRAVENOUS

## 2017-07-23 MED ORDER — TAMSULOSIN HCL 0.4 MG PO CAPS
0.4000 mg | ORAL_CAPSULE | Freq: Every day | ORAL | Status: DC
  Administered 2017-07-23: 0.4 mg via ORAL
  Filled 2017-07-23: qty 1

## 2017-07-23 MED ORDER — ROCURONIUM BROMIDE 50 MG/5ML IV SOSY
PREFILLED_SYRINGE | INTRAVENOUS | Status: AC
  Filled 2017-07-23: qty 5

## 2017-07-23 MED ORDER — PHENYLEPHRINE 40 MCG/ML (10ML) SYRINGE FOR IV PUSH (FOR BLOOD PRESSURE SUPPORT)
PREFILLED_SYRINGE | INTRAVENOUS | Status: DC | PRN
  Administered 2017-07-23: 40 ug via INTRAVENOUS
  Administered 2017-07-23 (×2): 80 ug via INTRAVENOUS

## 2017-07-23 MED ORDER — OXYCODONE HCL 5 MG PO TABS: 5.0000 mg | ORAL_TABLET | ORAL | Status: DC | PRN

## 2017-07-23 MED ORDER — ACETAMINOPHEN 500 MG PO TABS
1000.0000 mg | ORAL_TABLET | Freq: Four times a day (QID) | ORAL | Status: DC
  Administered 2017-07-23: 1000 mg via ORAL
  Filled 2017-07-23: qty 2

## 2017-07-23 MED ORDER — SODIUM CHLORIDE 0.9 % IJ SOLN
INTRAMUSCULAR | Status: AC
  Filled 2017-07-23: qty 20

## 2017-07-23 MED ORDER — LACTATED RINGERS IV SOLN
INTRAVENOUS | Status: DC
  Administered 2017-07-23: 10:00:00 via INTRAVENOUS

## 2017-07-23 MED ORDER — CIPROFLOXACIN IN D5W 400 MG/200ML IV SOLN
INTRAVENOUS | Status: DC | PRN
Start: 2017-07-23 — End: 2017-07-23
  Administered 2017-07-23: 400 mg via INTRAVENOUS

## 2017-07-23 MED ORDER — HYDROMORPHONE HCL 1 MG/ML IJ SOLN
INTRAMUSCULAR | Status: AC
  Administered 2017-07-23: 0.5 mg via INTRAVENOUS
  Filled 2017-07-23: qty 1

## 2017-07-23 MED ORDER — BUPIVACAINE LIPOSOME 1.3 % IJ SUSP
20.0000 mL | Freq: Once | INTRAMUSCULAR | Status: AC
Start: 2017-07-23 — End: 2017-07-23
  Administered 2017-07-23: 20 mL
  Filled 2017-07-23: qty 20

## 2017-07-23 MED ORDER — FENTANYL CITRATE (PF) 100 MCG/2ML IJ SOLN
INTRAMUSCULAR | Status: DC | PRN
  Administered 2017-07-23: 50 ug via INTRAVENOUS
  Administered 2017-07-23: 100 ug via INTRAVENOUS
  Administered 2017-07-23 (×2): 50 ug via INTRAVENOUS

## 2017-07-23 MED ORDER — PANTOPRAZOLE SODIUM 40 MG PO TBEC
40.0000 mg | DELAYED_RELEASE_TABLET | Freq: Every day | ORAL | Status: DC
  Administered 2017-07-23: 40 mg via ORAL
  Filled 2017-07-23 (×2): qty 1

## 2017-07-23 MED ORDER — DEXAMETHASONE SODIUM PHOSPHATE 10 MG/ML IJ SOLN
INTRAMUSCULAR | Status: AC
  Filled 2017-07-23: qty 1

## 2017-07-23 MED ORDER — DEXMEDETOMIDINE HCL 200 MCG/2ML IV SOLN
INTRAVENOUS | Status: DC | PRN
  Administered 2017-07-23: 25 ug via INTRAVENOUS

## 2017-07-23 MED ORDER — DEXMEDETOMIDINE HCL IN NACL 200 MCG/50ML IV SOLN
INTRAVENOUS | Status: AC
  Filled 2017-07-23: qty 50

## 2017-07-23 MED ORDER — PROPOFOL 10 MG/ML IV BOLUS
INTRAVENOUS | Status: AC
  Filled 2017-07-23: qty 20

## 2017-07-23 MED ORDER — ONDANSETRON HCL 4 MG/2ML IJ SOLN: 4.0000 mg | INTRAMUSCULAR | Status: DC | PRN

## 2017-07-23 MED ORDER — SENNOSIDES-DOCUSATE SODIUM 8.6-50 MG PO TABS: 2.0000 | ORAL_TABLET | Freq: Every day | ORAL | Status: DC

## 2017-07-23 MED ORDER — DEXAMETHASONE SODIUM PHOSPHATE 10 MG/ML IJ SOLN
INTRAMUSCULAR | Status: DC | PRN
  Administered 2017-07-23: 10 mg via INTRAVENOUS

## 2017-07-23 MED ORDER — FENTANYL CITRATE (PF) 250 MCG/5ML IJ SOLN
INTRAMUSCULAR | Status: AC
  Filled 2017-07-23: qty 5

## 2017-07-23 MED ORDER — CLINDAMYCIN PHOSPHATE 900 MG/50ML IV SOLN
900.0000 mg | INTRAVENOUS | Status: DC
  Filled 2017-07-23: qty 50

## 2017-07-23 MED ORDER — DEXTROSE 5 % IV SOLN
INTRAVENOUS | Status: DC | PRN
  Administered 2017-07-23: 40 ug/min via INTRAVENOUS

## 2017-07-23 MED ORDER — ONDANSETRON HCL 4 MG/2ML IJ SOLN
INTRAMUSCULAR | Status: AC
  Filled 2017-07-23: qty 2

## 2017-07-23 MED ORDER — FAMOTIDINE IN NACL 20-0.9 MG/50ML-% IV SOLN
20.0000 mg | INTRAVENOUS | Status: AC
  Administered 2017-07-23: 20 mg via INTRAVENOUS
  Filled 2017-07-23: qty 50

## 2017-07-23 MED ORDER — SODIUM CHLORIDE 0.9 % IV SOLN
INTRAVENOUS | Status: AC
  Administered 2017-07-23: 1000 mL via INTRAVENOUS
  Administered 2017-07-23: 18:00:00 via INTRAVENOUS

## 2017-07-23 MED ORDER — AMLODIPINE BESYLATE 10 MG PO TABS: 10.0000 mg | ORAL_TABLET | Freq: Every day | ORAL | Status: DC

## 2017-07-23 MED ORDER — MORPHINE SULFATE (PF) 4 MG/ML IV SOLN
2.0000 mg | INTRAVENOUS | Status: DC | PRN
  Administered 2017-07-23: 4 mg via INTRAVENOUS
  Administered 2017-07-23: 2 mg via INTRAVENOUS
  Filled 2017-07-23 (×3): qty 1

## 2017-07-23 MED ORDER — DIPHENHYDRAMINE HCL 50 MG/ML IJ SOLN
INTRAMUSCULAR | Status: AC
  Administered 2017-07-23: 22:00:00
  Filled 2017-07-23: qty 1

## 2017-07-23 MED ORDER — DIPHENHYDRAMINE HCL 50 MG/ML IJ SOLN: 50.0000 mg | Freq: Once | INTRAMUSCULAR | Status: DC

## 2017-07-23 MED ORDER — SUGAMMADEX SODIUM 200 MG/2ML IV SOLN
INTRAVENOUS | Status: DC | PRN
  Administered 2017-07-23: 200 mg via INTRAVENOUS

## 2017-07-23 MED ORDER — CIPROFLOXACIN IN D5W 400 MG/200ML IV SOLN
400.0000 mg | Freq: Two times a day (BID) | INTRAVENOUS | Status: AC
  Administered 2017-07-24: 400 mg via INTRAVENOUS
  Filled 2017-07-23: qty 200

## 2017-07-23 MED ORDER — CIPROFLOXACIN IN D5W 400 MG/200ML IV SOLN
400.0000 mg | INTRAVENOUS | Status: DC
  Filled 2017-07-23: qty 200

## 2017-07-23 MED ORDER — LIDOCAINE 2% (20 MG/ML) 5 ML SYRINGE
INTRAMUSCULAR | Status: AC
  Filled 2017-07-23: qty 5

## 2017-07-23 MED ORDER — AMLODIPINE BESYLATE 10 MG PO TABS
10.0000 mg | ORAL_TABLET | Freq: Every day | ORAL | Status: DC
  Filled 2017-07-23: qty 1

## 2017-07-23 MED ORDER — CLINDAMYCIN PHOSPHATE 900 MG/50ML IV SOLN
INTRAVENOUS | Status: DC | PRN
  Administered 2017-07-23: 900 mg via INTRAVENOUS

## 2017-07-23 MED ORDER — LACTATED RINGERS IV SOLN
INTRAVENOUS | Status: DC | PRN
  Administered 2017-07-23 (×2): via INTRAVENOUS

## 2017-07-23 MED ORDER — TRAMADOL HCL 50 MG PO TABS: 50.0000 mg | ORAL_TABLET | Freq: Four times a day (QID) | ORAL | 0 refills | Status: DC | PRN

## 2017-07-23 MED ORDER — ROCURONIUM BROMIDE 10 MG/ML (PF) SYRINGE
PREFILLED_SYRINGE | INTRAVENOUS | Status: DC | PRN
  Administered 2017-07-23: 50 mg via INTRAVENOUS
  Administered 2017-07-23: 20 mg via INTRAVENOUS

## 2017-07-23 MED ORDER — PROMETHAZINE HCL 25 MG/ML IJ SOLN: 6.2500 mg | INTRAMUSCULAR | Status: DC | PRN

## 2017-07-23 MED ORDER — HYDROMORPHONE HCL 1 MG/ML IJ SOLN
0.2500 mg | INTRAMUSCULAR | Status: DC | PRN
  Administered 2017-07-23 (×5): 0.5 mg via INTRAVENOUS

## 2017-07-23 MED ORDER — SODIUM CHLORIDE 0.9 % IJ SOLN
INTRAMUSCULAR | Status: DC | PRN
  Administered 2017-07-23: 20 mL

## 2017-07-23 MED ORDER — LACTATED RINGERS IR SOLN
Status: DC | PRN
  Administered 2017-07-23: 1000 mL

## 2017-07-23 MED ORDER — MIDAZOLAM HCL 5 MG/5ML IJ SOLN
INTRAMUSCULAR | Status: DC | PRN
  Administered 2017-07-23 (×2): 1 mg via INTRAVENOUS

## 2017-07-23 MED ORDER — SUGAMMADEX SODIUM 200 MG/2ML IV SOLN
INTRAVENOUS | Status: AC
  Filled 2017-07-23: qty 2

## 2017-07-23 MED ORDER — PROPOFOL 10 MG/ML IV BOLUS
INTRAVENOUS | Status: DC | PRN
  Administered 2017-07-23: 170 mg via INTRAVENOUS

## 2017-07-23 MED ORDER — MIDAZOLAM HCL 2 MG/2ML IJ SOLN
INTRAMUSCULAR | Status: AC
  Filled 2017-07-23: qty 2

## 2017-07-23 MED ORDER — EPHEDRINE 5 MG/ML INJ
INTRAVENOUS | Status: AC
  Filled 2017-07-23: qty 10

## 2017-07-23 MED ORDER — STERILE WATER FOR IRRIGATION IR SOLN
Status: DC | PRN
  Administered 2017-07-23: 1000 mL

## 2017-07-23 SURGICAL SUPPLY — 57 items
BAG LAPAROSCOPIC 12 15 PORT 16 (BASKET) ×1 IMPLANT
BAG RETRIEVAL 12/15 (BASKET) ×2
BAG RETRIEVAL 12/15MM (BASKET) ×1
CHLORAPREP W/TINT 26ML (MISCELLANEOUS) ×3 IMPLANT
CLIP VESOLOCK LG 6/CT PURPLE (CLIP) ×3 IMPLANT
CLIP VESOLOCK MED LG 6/CT (CLIP) ×3 IMPLANT
CLIP VESOLOCK XL 6/CT (CLIP) ×3 IMPLANT
COVER SURGICAL LIGHT HANDLE (MISCELLANEOUS) ×3 IMPLANT
COVER TIP SHEARS 8 DVNC (MISCELLANEOUS) ×1 IMPLANT
COVER TIP SHEARS 8MM DA VINCI (MISCELLANEOUS) ×2
DECANTER SPIKE VIAL GLASS SM (MISCELLANEOUS) ×3 IMPLANT
DERMABOND ADVANCED (GAUZE/BANDAGES/DRESSINGS) ×2
DERMABOND ADVANCED .7 DNX12 (GAUZE/BANDAGES/DRESSINGS) ×1 IMPLANT
DRAIN CHANNEL 15F RND FF 3/16 (WOUND CARE) IMPLANT
DRAPE ARM DVNC X/XI (DISPOSABLE) ×4 IMPLANT
DRAPE COLUMN DVNC XI (DISPOSABLE) ×1 IMPLANT
DRAPE DA VINCI XI ARM (DISPOSABLE) ×8
DRAPE DA VINCI XI COLUMN (DISPOSABLE) ×2
DRAPE INCISE IOBAN 66X45 STRL (DRAPES) ×3 IMPLANT
DRAPE LAPAROSCOPIC ABDOMINAL (DRAPES) ×3 IMPLANT
DRAPE SHEET LG 3/4 BI-LAMINATE (DRAPES) ×3 IMPLANT
ELECT PENCIL ROCKER SW 15FT (MISCELLANEOUS) ×3 IMPLANT
ELECT REM PT RETURN 15FT ADLT (MISCELLANEOUS) ×6 IMPLANT
EVACUATOR SILICONE 100CC (DRAIN) IMPLANT
GLOVE BIO SURGEON STRL SZ 6.5 (GLOVE) IMPLANT
GLOVE BIO SURGEONS STRL SZ 6.5 (GLOVE)
GLOVE BIOGEL M STRL SZ7.5 (GLOVE) ×6 IMPLANT
GOWN STRL REUS W/TWL LRG LVL3 (GOWN DISPOSABLE) ×9 IMPLANT
IRRIG SUCT STRYKERFLOW 2 WTIP (MISCELLANEOUS) ×3
IRRIGATION SUCT STRKRFLW 2 WTP (MISCELLANEOUS) ×1 IMPLANT
KIT BASIN OR (CUSTOM PROCEDURE TRAY) ×3 IMPLANT
LOOP VESSEL MAXI BLUE (MISCELLANEOUS) ×3 IMPLANT
NEEDLE INSUFFLATION 14GA 120MM (NEEDLE) ×3 IMPLANT
PORT ACCESS TROCAR AIRSEAL 12 (TROCAR) ×1 IMPLANT
PORT ACCESS TROCAR AIRSEAL 5M (TROCAR) ×2
POSITIONER SURGICAL ARM (MISCELLANEOUS) ×6 IMPLANT
RELOAD STAPLER WHITE 60MM (STAPLE) ×5 IMPLANT
SEAL CANN UNIV 5-8 DVNC XI (MISCELLANEOUS) ×4 IMPLANT
SEAL XI 5MM-8MM UNIVERSAL (MISCELLANEOUS) ×8
SET TRI-LUMEN FLTR TB AIRSEAL (TUBING) ×3 IMPLANT
SLEEVE SURGEON STRL (DRAPES) ×3 IMPLANT
SOLUTION ELECTROLUBE (MISCELLANEOUS) ×3 IMPLANT
SPONGE LAP 4X18 X RAY DECT (DISPOSABLE) ×3 IMPLANT
STAPLE ECHEON FLEX 60 POW ENDO (STAPLE) ×3 IMPLANT
STAPLER RELOAD WHITE 60MM (STAPLE) ×15
SUT ETHILON 3 0 PS 1 (SUTURE) IMPLANT
SUT MNCRL AB 4-0 PS2 18 (SUTURE) ×6 IMPLANT
SUT PDS AB 1 CT1 27 (SUTURE) ×12 IMPLANT
SUT VICRYL 0 UR6 27IN ABS (SUTURE) IMPLANT
TOWEL OR 17X26 10 PK STRL BLUE (TOWEL DISPOSABLE) ×3 IMPLANT
TOWEL OR NON WOVEN STRL DISP B (DISPOSABLE) ×6 IMPLANT
TRAY FOLEY W/METER SILVER 16FR (SET/KITS/TRAYS/PACK) ×3 IMPLANT
TRAY LAPAROSCOPIC (CUSTOM PROCEDURE TRAY) ×3 IMPLANT
TROCAR BLADELESS OPT 12M 100M (ENDOMECHANICALS) ×3 IMPLANT
TROCAR BLADELESS OPT 5 100 (ENDOMECHANICALS) IMPLANT
TROCAR XCEL 12X100 BLDLESS (ENDOMECHANICALS) ×3 IMPLANT
WATER STERILE IRR 1000ML POUR (IV SOLUTION) ×3 IMPLANT

## 2017-07-23 NOTE — Anesthesia Procedure Notes (Signed)
Procedure Name: Intubation Date/Time: 07/23/2017 11:26 AM Performed by: Anne Fu, CRNA Pre-anesthesia Checklist: Patient identified, Emergency Drugs available, Suction available, Patient being monitored and Timeout performed Patient Re-evaluated:Patient Re-evaluated prior to induction Oxygen Delivery Method: Circle system utilized Preoxygenation: Pre-oxygenation with 100% oxygen Induction Type: IV induction Ventilation: Mask ventilation without difficulty Laryngoscope Size: Mac and 4 Grade View: Grade II Tube type: Oral Tube size: 7.5 mm Number of attempts: 1 Airway Equipment and Method: Bougie stylet Placement Confirmation: ETT inserted through vocal cords under direct vision,  positive ETCO2 and breath sounds checked- equal and bilateral Secured at: 23 cm Tube secured with: Tape Dental Injury: Teeth and Oropharynx as per pre-operative assessment  Comments: Pt with stiff joints do to OA neck flexion limited Grade II view passed bougie noted clear bllat lung fields after exchange over bougie with 7.6m ETT.

## 2017-07-23 NOTE — Discharge Instructions (Signed)

## 2017-07-23 NOTE — Consult Note (Signed)
Reason for Consult: Respiratory distress. Referring Physician: Dr. Gloriann Loan.  Barry Curry is an 77 y.o. male.  HPI: History of hypertension, atrial fibrillation status post cardioversion, esophageal stricture status post dilation, history of pulmonary embolism and osteoarthritis who had undergone left-sided nephrectomy today started developing difficulty breathing with constriction of the throat with change in voice few hours after the surgery.  Patient prior to that had applesauce.  Was trying to burp was unable to do.  Following which patient became more and more restless and became short of breath.  Nurse at the bedside noted that patient's face and neck was swollen.  And on exam had subcutaneous crepitations extending from the neck and to the chest wall.  Patient was initially given morphine followed by Benadryl following which patient's symptoms improved.  Patient was transferred to stepdown unit and at that time I had examined the patient and patient at this time is able to talk well and feels better with no difficulty breathing.  Still has change in voice.  Which as per the daughter was at the bedside is stating the voices are different from what he has usually.  On exam patient still has crepitations suggestive of subcu tenderness emphysema of the neck and chest.  Past Medical History:  Diagnosis Date  . Anemia    history of  . Anxiety   . Aortic stenosis   . Arthritis   . Cancer ([place])    Melanoma back of neck  . Cancer of kidney ([place])   . Cardiomyopathy ([place])   . Complication of anesthesia    violent wake up from anesthesia  . Coronary artery disease   . Depression   . Dyspnea   . Esophageal stricture   . Heart murmur   . History of atrial fibrillation   . History of claustrophobia   . Hyperlipemia   . Hypertension   . Left kidney mass   . Lower extremity edema   . Mitral stenosis   . PVC (premature ventricular contraction)   . RBBB (right bundle branch block with left  anterior fascicular block)     Past Surgical History:  Procedure Laterality Date  . CARDIOVERSION    . COLONOSCOPY    . ESOPHAGEAL DILATION    . KNEE ARTHROSCOPY Left   . KNEE ARTHROSCOPY Right   . SHOULDER ARTHROSCOPY N/A   . TOTAL HIP ARTHROPLASTY Left   . TOTAL HIP ARTHROPLASTY Right   . TOTAL KNEE ARTHROPLASTY Left   . TOTAL KNEE ARTHROPLASTY Right   . TOTAL SHOULDER REPLACEMENT Left   . TOTAL SHOULDER REPLACEMENT Right   . UPPER GI ENDOSCOPY      History reviewed. No pertinent family history.  Social History:  reports that he quit smoking about 46 years ago. His smoking use included cigarettes. He has a 0.40 pack-year smoking history. he has never used smokeless tobacco. He reports that he drinks alcohol. He reports that he does not use drugs.  Allergies:  Allergies  Allergen Reactions  . Penicillins Rash    Has patient had a PCN reaction causing immediate rash, facial/tongue/throat swelling, SOB or lightheadedness with hypotension: No Has patient had a PCN reaction causing severe rash involving mucus membranes or skin necrosis: No Has patient had a PCN reaction that required hospitalization: No Has patient had a PCN reaction occurring within the last 10 years: No If all of the above answers are "NO", then may proceed with Cephalosporin use.     Medications: I have reviewed the  patient's current medications.  Results for orders placed or performed during the hospital encounter of 07/23/17 (from the past 48 hour(s))  Hemoglobin and hematocrit, blood     Status: Abnormal   Collection Time: 07/23/17  2:36 PM  Result Value Ref Range   Hemoglobin 9.9 (L) 13.0 - 17.0 g/dL   HCT 31.2 (L) 39.0 - 52.0 %    No results found.  Review of Systems  Constitutional: Negative.   HENT: Negative.   Eyes: Negative.   Respiratory: Positive for shortness of breath.   Cardiovascular: Negative.   Gastrointestinal: Negative.   Genitourinary: Negative.   Musculoskeletal: Negative.    Skin: Negative.   Neurological: Negative.   Endo/Heme/Allergies: Negative.   Psychiatric/Behavioral: Negative.    Blood pressure (!) 157/100, pulse (!) 109, temperature 98.3 F (36.8 C), temperature source Oral, resp. rate (!) 24, height 6' (1.829 m), weight 101.6 kg (224 lb), SpO2 97 %. Physical Exam  Constitutional: He is oriented to person, place, and time. He appears well-developed and well-nourished. No distress.  HENT:  Head: Normocephalic and atraumatic.  Eyes: Conjunctivae are normal. Pupils are equal, round, and reactive to light. Right eye exhibits no discharge. Left eye exhibits discharge.  Neck: Normal range of motion. Neck supple.  He has crepitations suggestive of subcutaneous emphysema.  Cardiovascular: Normal rate and regular rhythm.  Respiratory: Effort normal and breath sounds normal. No stridor. No respiratory distress. He has no wheezes. He has no rales.  Has crepitations suggestive of subcutaneous emphysema.  GI: Soft. Bowel sounds are normal. He exhibits no distension. There is no tenderness.  Dressing seen.  Musculoskeletal: Normal range of motion. He exhibits no edema or deformity.  Neurological: He is alert and oriented to person, place, and time.  Skin: Skin is warm and dry. He is not diaphoretic.  Psychiatric: He has a normal mood and affect.    Assessment/Plan: #1.  Acute respiratory failure with possible subcutaneous emphysema -x-rays have been done of the neck and chest which is still pending.  I have discussed with on-call pulmonary critical care Dr. Jimmy Footman about patient's condition and will be seeing patient in consult.  Patient will be kept n.p.o.  Concerns for esophageal tear/barotrauma. #2.  History of hypertension -presently n.p.o.  We will closely follow blood pressure trends and may need PRN IV hydralazine. #3.  History of atrial fibrillation status post cardioversion presently off anticoagulation secondary to preparation for surgery -presently  in sinus rhythm.  Closely observe. #4.  History of esophageal stricture with previous dilation. #5.  Left-sided nephrectomy for cystic mass per urologist.  Patient's labs including complete metabolic panel CBC chest x-ray and x-ray of the neck are pending. We will closely follow.  Thanks for involving [place] in patient's care.  Rise Patience 07/23/2017, 11:12 PM

## 2017-07-23 NOTE — Progress Notes (Addendum)
Urology Inpatient Progress Report  LEFT RENAL MASS  Procedure(s): XI ROBOTIC ASSISTED LAPAROSCOPIC NEPHRECTOMY  Day of Surgery   Intv/Subj: Notified by nursing of sudden onset of facial swelling as well as swelling of the chest with associated subcutaneous air palpable.  Rapid response was called and the patient was given Benadryl and Pepcid.  At the time, he was having some shortness of breath and difficulty breathing.  This improved shortly after medication.  His swelling improved as well.  He does have persistent subcutaneous emphysema along the chest and around the neck area.  He is not having significant discomfort or shortness of breath currently.  Vital signs are stable.  No difficulty swallowing as well.  He does not have any chest pain.  Dr. Valma Cava with anesthesiology evaluated the patient as well.  His thoughts are that this may have been caused by an allergic reaction to morphine.  I called our hospitalist on call and he is also  planning to evaluate the patient.  A stat chest x-ray and x-ray of the neck has been ordered.  He does have a history of esophageal stricture that has required dilation in the past.  Active Problems:   Kidney mass  Current Facility-Administered Medications  Medication Dose Route Frequency Provider Last Rate Last Dose  . 0.9 %  sodium chloride infusion   Intravenous Continuous Narang, Gopal L, MD 75 mL/hr at 07/23/17 1813    . acetaminophen (TYLENOL) tablet 1,000 mg  1,000 mg Oral Q6H Narang, Gopal L, MD   1,000 mg at 07/23/17 1809  . [START ON 07/24/2017] amLODipine (NORVASC) tablet 10 mg  10 mg Oral Daily Narang, Gopal L, MD      . ciprofloxacin (CIPRO) IVPB 400 mg  400 mg Intravenous Q12H Narang, Gopal L, MD      . diphenhydrAMINE (BENADRYL) injection 50 mg  50 mg Intravenous Once Marton Redwood III, MD      . morphine 4 MG/ML injection 2-4 mg  2-4 mg Intravenous Q2H PRN Narang, Gopal L, MD   4 mg at 07/23/17 1953  . ondansetron (ZOFRAN) injection 4 mg  4  mg Intravenous Q4H PRN Narang, Gopal L, MD      . oxyCODONE (Oxy IR/ROXICODONE) immediate release tablet 5 mg  5 mg Oral Q4H PRN Narang, Gopal L, MD      . pantoprazole (PROTONIX) EC tablet 40 mg  40 mg Oral Daily Narang, Gopal L, MD   40 mg at 07/23/17 1809  . senna-docusate (Senokot-S) tablet 2 tablet  2 tablet Oral QHS Narang, Gopal L, MD      . tamsulosin (FLOMAX) capsule 0.4 mg  0.4 mg Oral QPC supper Narang, Gopal L, MD   0.4 mg at 07/23/17 1809     Objective: Vital: Vitals:   07/23/17 1600 07/23/17 1627 07/23/17 2058 07/23/17 2109  BP: 133/79  (!) 154/98 (!) 157/100  Pulse: 78  93 (!) 109  Resp: 16  20 (!) 24  Temp: 98.1 F (36.7 C)  98.3 F (36.8 C)   TempSrc:   Oral   SpO2: 96%  99% 97%  Weight:  101.6 kg (224 lb)    Height:  6' (1.829 m)     I/Os: I/O last 3 completed shifts: In: 3573.8 [P.O.:480; I.V.:2843.8; IV Piggyback:250] Out: 7628 [Urine:1175; Blood:50]  Physical Exam:  General: Patient is in no apparent distress Lungs: Normal respiratory effort, chest expands symmetrically.  There is subcutaneous air palpable along the chest as well as along the  neck area GI:The abdomen is soft and nontender  Foley: Clear yellow urine Ext: lower extremities symmetric  Lab Results: Recent Labs    07/23/17 1436  HGB 9.9*  HCT 31.2*   No results for input(s): NA, K, CL, CO2, GLUCOSE, BUN, CREATININE, CALCIUM in the last 72 hours. No results for input(s): LABPT, INR in the last 72 hours. No results for input(s): LABURIN in the last 72 hours. Results for orders placed or performed during the hospital encounter of 03/25/10  Urine culture     Status: None   Collection Time: 03/21/10  1:46 PM  Result Value Ref Range Status   Specimen Description URINE, CLEAN CATCH  Final   Special Requests NONE  Final   Culture  Setup Time [id number]  Final   Colony Count 8,000 COLONIES/ML  Final   Culture INSIGNIFICANT GROWTH  Final   Report Status 03/22/2010 FINAL  Final  Surgical  pcr screen     Status: None   Collection Time: 03/21/10  1:46 PM  Result Value Ref Range Status   MRSA, PCR NEGATIVE NEGATIVE Final   Staphylococcus aureus  NEGATIVE Final    NEGATIVE        The Xpert SA Assay (FDA approved for NASAL specimens only), is one component of a comprehensive surveillance program.  It is not intended to diagnose infection nor to guide or monitor treatment.  Culture, sputum-assessment     Status: None   Collection Time: 03/28/10  8:51 AM  Result Value Ref Range Status   Specimen Description SPUTUM  Final   Special Requests PATIENT ON FOLLOWING AVELOX  Final   Sputum evaluation   Final    MICROSCOPIC FINDINGS SUGGEST THAT THIS SPECIMEN IS NOT REPRESENTATIVE OF LOWER RESPIRATORY SECRETIONS. PLEASE RECOLLECT. CALLED TO Lilli Light, RN 03/28/10 1155 BY K SCHULTZ   Report Status 03/28/2010 FINAL  Final    Studies/Results: No results found.  Assessment: 1.  Left renal mass 2.  Shortness of breath with facial swelling and associated subcutaneous air Procedure(s): XI ROBOTIC ASSISTED LAPAROSCOPIC NEPHRECTOMY, Day of Surgery    Plan: Make the patient n.p.o. for now.  He has been transferred to stepdown for closer monitoring.   the hospitalist on-call has been consulted.  Chest x-ray and neck x-ray are pending.  I will defer to our hospitalist as to whether further imaging and/or consultation is necessary.  Continue IV fluids and supplemental oxygen as needed.   Link Snuffer, MD Urology 07/23/2017, 10:58 PM

## 2017-07-23 NOTE — Progress Notes (Signed)
Original assessment patient unremarkable except for swelling on left side of face. Patient also complained of throat feeling scratchy.  Explained that the ET tube causes that in OR.   An hour later patient called me to room stating soomething was worse with his throat and he could only breathe if his head was hyper extended. When patient tried flexing neck his voice changed and he was unable to get air in.  Patient now with SQ air that was not noted on first assessment. Rapid response, Anesthesia, and RT called, Dr. Gloriann Loan called.  Patient transferring to SDU with Jennye Moccasin, RR Nurse, for closer observation. Report to [facility], Therapist, sports. Hoyle Barr, RN

## 2017-07-23 NOTE — Transfer of Care (Signed)
Immediate Anesthesia Transfer of Care Note  Patient: Barry Curry  Procedure(s) Performed: Procedure(s): XI ROBOTIC ASSISTED LAPAROSCOPIC NEPHRECTOMY (Left)  Patient Location: PACU  Anesthesia Type:General  Level of Consciousness:  sedated, patient cooperative and responds to stimulation  Airway & Oxygen Therapy:Patient Spontanous Breathing and Patient connected to face mask oxgen  Post-op Assessment:  Report given to PACU RN and Post -op Vital signs reviewed and stable  Post vital signs:  Reviewed and stable  Last Vitals:  Vitals:   07/23/17 0935  BP: (!) 139/95  Pulse: 84  Resp: 16  Temp: 36.7 C  SpO2: 387%    Complications: No apparent anesthesia complications

## 2017-07-23 NOTE — H&P (Signed)
Urology Admission H&P  Chief Complaint: Pre-OP LEFT Robotic Radical Nephrectomy  History of Present Illness:   1 - Cystic Left Renal Mass - 7cm left upper cystic, enhancing mass incidental on [place] 12/216 [place] plan left radical nephrectomy but surgery cancelled twice locally and he has lost confidence and would like to consider having done in [place] as he has family here. 2 artery / 1 vein left renovascular anatomy.   PMH sig for AFib (resolved s/p cardioversion 2018, Mild AS, has cards clearance for nephrectomy by his cardiologist Paulene Floor MD with [facility] in [place], ortho surgery (no chest/abd surgeries). His PCP is Dr. Delight Stare in [place].   Today "Barry Curry" is seen to proceed with LEFT robotic radical nephrectomy.     Past Medical History:  Diagnosis Date  . Anemia    history of  . Anxiety   . Aortic stenosis   . Arthritis   . Cancer ([place])    Melanoma back of neck  . Cancer of kidney ([place])   . Cardiomyopathy ([place])   . Complication of anesthesia    violent wake up from anesthesia  . Coronary artery disease   . Depression   . Dyspnea   . Esophageal stricture   . Heart murmur   . History of atrial fibrillation   . History of claustrophobia   . Hyperlipemia   . Hypertension   . Left kidney mass   . Lower extremity edema   . Mitral stenosis   . PVC (premature ventricular contraction)   . RBBB (right bundle branch block with left anterior fascicular block)    Past Surgical History:  Procedure Laterality Date  . CARDIOVERSION    . COLONOSCOPY    . ESOPHAGEAL DILATION    . KNEE ARTHROSCOPY Left   . KNEE ARTHROSCOPY Right   . SHOULDER ARTHROSCOPY N/A   . TOTAL HIP ARTHROPLASTY Left   . TOTAL HIP ARTHROPLASTY Right   . TOTAL KNEE ARTHROPLASTY Left   . TOTAL KNEE ARTHROPLASTY Right   . TOTAL SHOULDER REPLACEMENT Left   . TOTAL SHOULDER REPLACEMENT Right   . UPPER GI ENDOSCOPY      Home Medications:  No current facility-administered medications  for this encounter.    Current Outpatient Medications  Medication Sig Dispense Refill Last Dose  . acetaminophen (TYLENOL) 500 MG tablet Take 500 mg by mouth 2 (two) times daily.     Marland Kitchen amLODipine (NORVASC) 10 MG tablet Take 10 mg by mouth daily.     . fluticasone (FLONASE) 50 MCG/ACT nasal spray Place 2 sprays into both nostrils at bedtime.     Marland Kitchen lisinopril (PRINIVIL,ZESTRIL) 20 MG tablet Take 10 mg by mouth daily.     Marland Kitchen loperamide (IMODIUM) 2 MG capsule Take 2 mg by mouth every other day.     . nebivolol (BYSTOLIC) 10 MG tablet Take 10 mg by mouth daily.     . pantoprazole (PROTONIX) 40 MG tablet Take 40 mg by mouth daily.     . tamsulosin (FLOMAX) 0.4 MG CAPS capsule Take 0.4 mg by mouth daily after supper.     Marland Kitchen apixaban (ELIQUIS) 5 MG TABS tablet Take 5 mg by mouth 2 (two) times daily.     Marland Kitchen aspirin EC 81 MG tablet Take 81 mg by mouth daily.      Allergies:  Allergies  Allergen Reactions  . Penicillins Rash    Has patient had a PCN reaction causing immediate rash, facial/tongue/throat swelling, SOB or  lightheadedness with hypotension: No Has patient had a PCN reaction causing severe rash involving mucus membranes or skin necrosis: No Has patient had a PCN reaction that required hospitalization: No Has patient had a PCN reaction occurring within the last 10 years: No If all of the above answers are "NO", then may proceed with Cephalosporin use.     No family history on file. Social History:  reports that he quit smoking about 46 years ago. His smoking use included cigarettes. He has a 0.40 pack-year smoking history. he has never used smokeless tobacco. He reports that he drinks alcohol. He reports that he does not use drugs.  Review of Systems  Constitutional: Negative.  Negative for chills and fever.  HENT: Negative.   Eyes: Negative.   Respiratory: Negative.   Cardiovascular: Negative.   Gastrointestinal: Negative.   Genitourinary: Negative.   Musculoskeletal: Negative.    Skin: Negative.   Neurological: Negative.   Endo/Heme/Allergies: Negative.   Psychiatric/Behavioral: Negative.     Physical Exam:  Vital signs in last 24 hours:   Physical Exam  Constitutional: He appears well-developed.  HENT:  Head: Normocephalic.  Eyes: Pupils are equal, round, and reactive to light.  Neck: Normal range of motion.  Cardiovascular: Normal rate.  Respiratory: Effort normal.  GI: Soft.  Genitourinary:  Genitourinary Comments: NO CVAT  Musculoskeletal: Normal range of motion.  Neurological: He is alert.  Skin: Skin is warm.  Psychiatric: He has a normal mood and affect.      Plan:   Proceed with LEFT robotic radical nephrectomy. Risks, benefits, alternatives, expected peri-op course discussed previously and reiterated today.   Tiauna Whisnant 07/23/2017, 7:05 AM

## 2017-07-23 NOTE — Brief Op Note (Signed)
07/23/2017  1:47 PM  PATIENT:  Barry Curry  77 y.o. male  PRE-OPERATIVE DIAGNOSIS:  LEFT RENAL MASS  POST-OPERATIVE DIAGNOSIS:  LEFT RENAL MASS  PROCEDURE:  Procedure(s): XI ROBOTIC ASSISTED LAPAROSCOPIC NEPHRECTOMY (Left)  SURGEON:  Surgeon(s) and Role:    Alexis Frock, MD - Primary  PHYSICIAN ASSISTANT:   ASSISTANTS: Maud Deed MD   ANESTHESIA:   local and general  EBL:  50 mL   BLOOD ADMINISTERED:none  DRAINS: foley to gravity   LOCAL MEDICATIONS USED:  MARCAINE     SPECIMEN:  Source of Specimen:  left radical nephrectomy  DISPOSITION OF SPECIMEN:  PATHOLOGY  COUNTS:  YES  TOURNIQUET:  * No tourniquets in log *  DICTATION: .Other Dictation: Dictation Number  [id number]  PLAN OF CARE: Admit to inpatient   PATIENT DISPOSITION:  PACU - hemodynamically stable.   Delay start of Pharmacological VTE agent (>24hrs) due to surgical blood loss or risk of bleeding: yes

## 2017-07-23 NOTE — Anesthesia Preprocedure Evaluation (Addendum)
Anesthesia Evaluation  Patient identified by MRN, date of birth, ID band Patient awake    Reviewed: Allergy & Precautions, NPO status , Patient's Chart, lab work & pertinent test results  Airway Mallampati: II  TM Distance: <3 FB Neck ROM: Limited    Dental no notable dental hx.    Pulmonary neg pulmonary ROS, former smoker,    Pulmonary exam normal breath sounds clear to auscultation       Cardiovascular hypertension, + CAD  + dysrhythmias Atrial Fibrillation + Valvular Problems/Murmurs AS  Rhythm:Regular Rate:Normal + Systolic murmurs    Neuro/Psych negative neurological ROS  negative psych ROS   GI/Hepatic negative GI ROS, Neg liver ROS,   Endo/Other  negative endocrine ROS  Renal/GU negative Renal ROS  negative genitourinary   Musculoskeletal negative musculoskeletal ROS (+)   Abdominal   Peds negative pediatric ROS (+)  Hematology  (+) anemia ,   Anesthesia Other Findings   Reproductive/Obstetrics negative OB ROS                           Anesthesia Physical Anesthesia Plan  ASA: III  Anesthesia Plan: General   Post-op Pain Management:    Induction: Intravenous  PONV Risk Score and Plan: 2 and Ondansetron, Dexamethasone and Treatment may vary due to age or medical condition  Airway Management Planned: Oral ETT  Additional Equipment:   Intra-op Plan:   Post-operative Plan: Extubation in OR  Informed Consent: I have reviewed the patients History and Physical, chart, labs and discussed the procedure including the risks, benefits and alternatives for the proposed anesthesia with the patient or authorized representative who has indicated his/her understanding and acceptance.   Dental advisory given  Plan Discussed with: CRNA and Surgeon  Anesthesia Plan Comments:         Anesthesia Quick Evaluation

## 2017-07-23 NOTE — Consult Note (Addendum)
Name: Barry Curry MRN: [id number] DOB: 08-Aug-1940    ADMISSION DATE:  07/23/2017 CONSULTATION DATE:  07/23/2017  REFERRING MD :  Dr. Tresa Moore  CHIEF COMPLAINT:  Subcutaneous Emphysema   HISTORY OF PRESENT ILLNESS:   77 year old male former smoker with PMH of Anemia, Anxiety, Aortic/Mitral Stenosis, Melanoma, HTN, Cardiomyopathy, CAD, Depression, Esophageal Stricture, A.Fib (S/P cardioversion 2018), HLD, HTN, Left upper cystic mass found (12/2016)  Presents 1/25 for scheduled left robotic radical nephrectomy in setting of 7 cm left upper cystic enhancing mass. In post-operative states arrived to ICU with noted swelling to left side of face and reported scratchy throat. About an hour later patient reported trouble breathing with noted voice change. Bedside nurse reports SQ air on neck and anterior chest. Given 50 mg Benadryl and IV Pepcid. CXR with extensive subcutaneous emphysema throughout neck and chest with upper pneumomediastinum. PCCM asked to consult   On interview patient reports he felt well prior to the surgery but when he came out of surgery immediately post-op his daughter noted his left cheek was swollen. Initially she was told this was due to how he was lying on the OR table intraoperatively. Then 1 hour later patient began belching some and then developed sensation of his throat closing up and reports although he did NOT feel SOB, he felt he could not effectively breathe unless his head was extended. The daughter noticed that his voice changed quality during this time and was more hoarse. RN reports there was no lip or tongue swelling at this time; a different RN reports uvula looked swollen on their exam at that time. However on my exam I could not even SEE his uvula (mallampati 4) without a tongue depressor which was not readily available in the room and had to be located. So I am doubtful of the quality of the initial oropharynx exam. Patient reports no coughing, CP, N/V; he reports  only mild abdominal pain over the site of his large surgical incision.   SIGNIFICANT EVENTS  1/25 > Presents for radical nephrectomy   STUDIES:  CXR 1/25 > Extensive subcutaneous emphysema throughout the neck and chest with upper pneumomediastinum. Changes are compatible with esophageal rupture in the appropriate clinical setting. Tracheal injury or barotrauma could also have this appearance. Cardiac enlargement.  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  has a past surgical history that includes Esophageal dilation; Total knee arthroplasty (Left); Total knee arthroplasty (Right); Total hip arthroplasty (Left); Total hip arthroplasty (Right); Total shoulder replacement (Left); Total shoulder replacement (Right); Shoulder arthroscopy (N/A); Knee arthroscopy (Left); Knee arthroscopy (Right); Cardioversion; Colonoscopy; and Upper gi endoscopy. Prior to Admission medications   Medication Sig Start Date End Date Taking? Authorizing Provider  acetaminophen (TYLENOL) 500 MG tablet Take 500 mg by mouth 2 (two) times daily.   Yes [provider]  amLODipine (NORVASC) 10 MG tablet Take 10 mg by mouth daily.   Yes [provider]  aspirin EC 81 MG tablet Take 81 mg by mouth daily.   Yes [provider]  fluticasone (FLONASE) 50 MCG/ACT nasal spray Place 2 sprays into both nostrils at bedtime.  Yes [provider]  lisinopril (PRINIVIL,ZESTRIL) 20 MG tablet Take 10 mg by mouth daily.   Yes  [provider]  loperamide (IMODIUM) 2 MG capsule Take 2 mg by mouth every other day.   Yes [provider]  nebivolol (BYSTOLIC) 10 MG tablet Take 10 mg by mouth daily.   Yes [provider]  pantoprazole (PROTONIX) 40 MG tablet Take 40 mg by mouth daily.   Yes [provider]  tamsulosin (FLOMAX) 0.4 MG CAPS capsule Take 0.4 mg by mouth daily after supper.   Yes [provider]  apixaban (ELIQUIS) 5 MG TABS tablet Take 5 mg by mouth 2 (two) times daily.    [provider]  traMADol (ULTRAM) 50 MG tablet Take 1 tablet (50 mg total) by mouth every 6 (six) hours as needed. 07/23/17 07/23/18  Alla Feeling, MD   Allergies  Allergen Reactions  . Penicillins Rash    Has patient had a PCN reaction causing immediate rash, facial/tongue/throat swelling, SOB or lightheadedness with hypotension: No Has patient had a PCN reaction causing severe rash involving mucus membranes or skin necrosis: No Has patient had a PCN reaction that required hospitalization: No Has patient had a PCN reaction occurring within the last 10 years: No If all of the above answers are "NO", then may proceed with Cephalosporin use.     FAMILY HISTORY:  family history is not on file. SOCIAL HISTORY:  reports that he quit smoking about 46 years ago. His smoking use included cigarettes. He has a 0.40 pack-year smoking history. he has never used smokeless tobacco. He reports that he drinks alcohol. He reports that he does not use drugs.  REVIEW OF SYSTEMS:   All negative except as noted in HPI; except for those that are bolded, which indicate positives. Constitutional: weight loss, weight gain, night sweats, fevers, chills, fatigue, weakness.  HEENT: headaches, sore throat, sneezing, nasal congestion, post nasal drip, difficulty swallowing, tooth/dental problems, visual complaints, visual changes, ear aches. Neuro: difficulty with speech, weakness, numbness, ataxia. CV:   chest pain, orthopnea, PND, swelling in lower extremities, dizziness, palpitations, syncope.  Resp: cough, hemoptysis, dyspnea, wheezing. GI: heartburn, indigestion, abdominal pain, nausea, vomiting, diarrhea, constipation, change in bowel habits, loss of appetite, hematemesis, melena, hematochezia.  GU: dysuria, change in color of urine, urgency or frequency, flank pain, hematuria. MSK: joint pain or swelling, decreased range of motion. Psych: change in mood or affect, depression, anxiety, suicidal ideations, homicidal ideations. Skin: rash, itching, bruising.  SUBJECTIVE: lying in ICU bed in NAD. "I feel somewhat better now."   VITAL SIGNS: Temp:  [97.5 F (36.4 C)-98.3 F (36.8 C)] 98.3 F (36.8 C) (01/25 2058) Pulse Rate:  [69-109] 86 (01/25 2300) Resp:  [15-24] 15 (01/25 2300) BP: (104-157)/(60-100) 144/83 (01/25 2300) SpO2:  [96 %-100 %] 99 % (01/25 2300) Weight:  [100.2 kg (221 lb)-101.6 kg (224 lb)] 100.3 kg (221 lb 1.9 oz) (01/25 2300)  PHYSICAL EXAMINATION: General: WDWN Elderly male lying in ICU bed in NAD, O2 via [place] in place  Neuro: Awake/alert, moving all extremities, PERRL, obeying commands and answering questions, conversive. Somewhat slurred speech which his daughter thinks is due to pain meds HEENT: (with aid of tongue depressor): mallampati 4, normal posterior oropharynx. No swelling of uvula or tongue. Tonsils are surgically absent. Mild swelling of Left cheek compared to right cheek; on palpation of cheek there is considerable crepitance.  Cardiovascular: RRR no m/r/g Lungs: CTA b/l but auscultation limited by loud "crackles" from  crepitance from significant subcutanenous emphysema.  Abdomen: Soft nondistended, larger surgical incision as well as smaller laparoscopic incisions are clean and intact; mild crepitance auscultated in the upper abdomen; Mild tenderness over the larger incision but overall non-tender exam Musculoskeletal: no LE edema  Skin: no rashes   No  results for input(s): NA, K, CL, CO2, BUN, CREATININE, GLUCOSE in the last 168 hours. Recent Labs  Lab 07/19/17 1137 07/23/17 1436  HGB 11.0* 9.9*  HCT 34.6* 31.2*  WBC 7.4  --   PLT 185  --    No results found.  ASSESSMENT / PLAN:  1. Respiratory distress (resolved); Pneumomediastinum; Subcutaneous emphysema: - likely due to perforation within either trachea/bronchi or esophagus. Intubation note comments on it being a difficult intubation that required the bogie. A bougie is stiff enough that it could've caused small perf of one of the subsegmental bronchi if inserted too far. Patient is also at risk for possible esophageal perforation given his history of esophageal stricture. It is note clear to me at this time if an OG tube was inserted or attempted to be inserted intraoperatively. It is also possible although less likely that this is the result of barotrauma from mechanical ventilation itself. Patient denies any history of COPD/Emphysema which would've made that more likely. During laparoscopic cases there is often some air insufflated into the abdomen to aid in view, which could theoretically then migrate up through holes in the diaphragm. But the amount of air used is small compared to the amount of subcutaneous emphysema he currently has. Therefore I feel that is less likely. He denies any N/V or Retching postop that could've caused an esophageal tear. And when he felt his neck was "closing up" he and his daughter both deny that he was having increased work of breathing or any witnessed forceful inhalations that could cause a spontaneous pneumomediastinum, as we occasionally see with patients in respiratory distress and working hard to breathe. At this time I feel it is most likely this pneumomediastinum is due to a small bronchial tear, and if so, it should heal on its own without intervention. However if he has an esophageal tear, he may need GI consult with EGD. I do not see any signs of  angioedema or anaphylaxis at the time of my exam, although PCCM was not consulted until several hours after initial symptoms. So it is possible that it may have resolved prior to my exam. However the "swelling" seen in his cheek during my exam was due to crepitance. So I feel it more likely that all of this was due to the subcutaneous emphysema and that he likely did not have an allergic reaction. Will obtain Chest CT STAT. Will review images and decide how to proceed. Keep strict NPO (no pills) for now.   60 minutes critical care time  Vernie Murders, MD  Pulmonary and [place] Pager: [phone]  07/23/2017, 11:40 PM

## 2017-07-24 ENCOUNTER — Encounter (HOSPITAL_COMMUNITY): Payer: Self-pay | Admitting: Radiology

## 2017-07-24 ENCOUNTER — Inpatient Hospital Stay (HOSPITAL_COMMUNITY): Payer: Medicare Other

## 2017-07-24 DIAGNOSIS — J9601 Acute respiratory failure with hypoxia: Secondary | ICD-10-CM

## 2017-07-24 DIAGNOSIS — I10 Essential (primary) hypertension: Secondary | ICD-10-CM

## 2017-07-24 DIAGNOSIS — I480 Paroxysmal atrial fibrillation: Secondary | ICD-10-CM

## 2017-07-24 DIAGNOSIS — J989 Respiratory disorder, unspecified: Secondary | ICD-10-CM

## 2017-07-24 LAB — CBC WITH DIFFERENTIAL/PLATELET
BASOS PCT: 0 %
Basophils Absolute: 0 10*3/uL (ref 0.0–0.1)
EOS ABS: 0 10*3/uL (ref 0.0–0.7)
Eosinophils Relative: 0 %
HEMATOCRIT: 31.7 % — AB (ref 39.0–52.0)
HEMOGLOBIN: 10.1 g/dL — AB (ref 13.0–17.0)
LYMPHS ABS: 0.2 10*3/uL — AB (ref 0.7–4.0)
Lymphocytes Relative: 3 %
MCH: 25.8 pg — AB (ref 26.0–34.0)
MCHC: 31.9 g/dL (ref 30.0–36.0)
MCV: 81.1 fL (ref 78.0–100.0)
Monocytes Absolute: 0.6 10*3/uL (ref 0.1–1.0)
Monocytes Relative: 7 %
NEUTROS ABS: 7.8 10*3/uL — AB (ref 1.7–7.7)
NEUTROS PCT: 90 %
Platelets: 167 10*3/uL (ref 150–400)
RBC: 3.91 MIL/uL — AB (ref 4.22–5.81)
RDW: 14.2 % (ref 11.5–15.5)
WBC: 8.6 10*3/uL (ref 4.0–10.5)

## 2017-07-24 LAB — COMPREHENSIVE METABOLIC PANEL
ALT: 15 U/L — AB (ref 17–63)
ANION GAP: 5 (ref 5–15)
AST: 29 U/L (ref 15–41)
Albumin: 3.8 g/dL (ref 3.5–5.0)
Alkaline Phosphatase: 66 U/L (ref 38–126)
BUN: 17 mg/dL (ref 6–20)
CO2: 26 mmol/L (ref 22–32)
CREATININE: 1.27 mg/dL — AB (ref 0.61–1.24)
Calcium: 8.6 mg/dL — ABNORMAL LOW (ref 8.9–10.3)
Chloride: 106 mmol/L (ref 101–111)
GFR, EST NON AFRICAN AMERICAN: 53 mL/min — AB (ref >60)
Glucose, Bld: 137 mg/dL — ABNORMAL HIGH (ref 65–99)
Potassium: 4.9 mmol/L (ref 3.5–5.1)
Sodium: 137 mmol/L (ref 135–145)
Total Bilirubin: 0.6 mg/dL (ref 0.3–1.2)
Total Protein: 6.4 g/dL — ABNORMAL LOW (ref 6.5–8.1)

## 2017-07-24 LAB — BASIC METABOLIC PANEL
ANION GAP: 7 (ref 5–15)
BUN: 17 mg/dL (ref 6–20)
CALCIUM: 8.7 mg/dL — AB (ref 8.9–10.3)
CO2: 25 mmol/L (ref 22–32)
Chloride: 103 mmol/L (ref 101–111)
Creatinine, Ser: 1.33 mg/dL — ABNORMAL HIGH (ref 0.61–1.24)
GFR, EST AFRICAN AMERICAN: 58 mL/min — AB (ref >60)
GFR, EST NON AFRICAN AMERICAN: 50 mL/min — AB (ref >60)
GLUCOSE: 123 mg/dL — AB (ref 65–99)
POTASSIUM: 4.7 mmol/L (ref 3.5–5.1)
SODIUM: 135 mmol/L (ref 135–145)

## 2017-07-24 LAB — HEMOGLOBIN AND HEMATOCRIT, BLOOD
HEMATOCRIT: 31.7 % — AB (ref 39.0–52.0)
HEMOGLOBIN: 10.2 g/dL — AB (ref 13.0–17.0)

## 2017-07-24 LAB — MRSA PCR SCREENING: MRSA BY PCR: POSITIVE — AB

## 2017-07-24 MED ORDER — IOPAMIDOL (ISOVUE-300) INJECTION 61%
INTRAVENOUS | Status: AC
  Filled 2017-07-24: qty 75

## 2017-07-24 MED ORDER — BELLADONNA ALKALOIDS-OPIUM 16.2-60 MG RE SUPP
1.0000 | Freq: Three times a day (TID) | RECTAL | Status: DC | PRN
  Administered 2017-07-24: 1 via RECTAL
  Filled 2017-07-24 (×2): qty 1

## 2017-07-24 MED ORDER — IOPAMIDOL (ISOVUE-300) INJECTION 61%
75.0000 mL | Freq: Once | INTRAVENOUS | Status: AC | PRN
  Administered 2017-07-24: 75 mL via INTRAVENOUS

## 2017-07-24 MED ORDER — PANTOPRAZOLE SODIUM 40 MG IV SOLR
40.0000 mg | INTRAVENOUS | Status: DC
  Administered 2017-07-24 – 2017-07-26 (×3): 40 mg via INTRAVENOUS
  Filled 2017-07-24 (×3): qty 40

## 2017-07-24 MED ORDER — MUPIROCIN 2 % EX OINT
1.0000 "application " | TOPICAL_OINTMENT | Freq: Two times a day (BID) | CUTANEOUS | Status: DC
  Administered 2017-07-24 – 2017-07-26 (×6): 1 via NASAL
  Filled 2017-07-24 (×3): qty 22

## 2017-07-24 MED ORDER — LISINOPRIL 10 MG PO TABS
10.0000 mg | ORAL_TABLET | Freq: Every day | ORAL | Status: DC
  Administered 2017-07-24 – 2017-07-26 (×3): 10 mg via ORAL
  Filled 2017-07-24 (×3): qty 1

## 2017-07-24 MED ORDER — SODIUM CHLORIDE 0.9 % IV SOLN
INTRAVENOUS | Status: DC
  Administered 2017-07-24: 13:00:00 via INTRAVENOUS

## 2017-07-24 MED ORDER — CHLORHEXIDINE GLUCONATE CLOTH 2 % EX PADS
6.0000 | MEDICATED_PAD | Freq: Every day | CUTANEOUS | Status: DC
  Administered 2017-07-25 – 2017-07-26 (×2): 6 via TOPICAL

## 2017-07-24 MED ORDER — IOPAMIDOL (ISOVUE-300) INJECTION 61%
INTRAVENOUS | Status: AC
  Filled 2017-07-24: qty 300

## 2017-07-24 MED ORDER — HEPARIN SODIUM (PORCINE) 5000 UNIT/ML IJ SOLN
5000.0000 [IU] | Freq: Three times a day (TID) | INTRAMUSCULAR | Status: DC
  Administered 2017-07-24 – 2017-07-26 (×6): 5000 [IU] via SUBCUTANEOUS
  Filled 2017-07-24 (×5): qty 1

## 2017-07-24 MED ORDER — IOPAMIDOL (ISOVUE-300) INJECTION 61%
INTRAVENOUS | Status: AC
  Administered 2017-07-24: 150 mL via ORAL
  Filled 2017-07-24: qty 150

## 2017-07-24 MED ORDER — ACETAMINOPHEN 10 MG/ML IV SOLN
1000.0000 mg | Freq: Four times a day (QID) | INTRAVENOUS | Status: AC | PRN
  Administered 2017-07-24: 1000 mg via INTRAVENOUS
  Filled 2017-07-24: qty 100

## 2017-07-24 MED ORDER — NEBIVOLOL HCL 10 MG PO TABS
10.0000 mg | ORAL_TABLET | Freq: Every day | ORAL | Status: DC
  Administered 2017-07-24 – 2017-07-26 (×3): 10 mg via ORAL
  Filled 2017-07-24 (×3): qty 1

## 2017-07-24 MED ORDER — ORAL CARE MOUTH RINSE: 15.0000 mL | Freq: Two times a day (BID) | OROMUCOSAL | Status: DC

## 2017-07-24 NOTE — Progress Notes (Signed)
  PROGRESS NOTE  Barry Curry NID:[id number] DOB: [birth year] DOA: 07/23/2017 PCP: Patient, No Pcp Per  Brief Narrative: 76yom admitted for left radical nephrectomy for cystic left rnal mass, s/p surgery 1/25, afterwards developed SOB, change in voice, facial/neck swelling, subcutaneous crepitations neck to chest wall, treated with morphine and Benadryl and transferred to SDU for acute resp failure. Imaging showed extensive subq emphysema neck an chest and upper pneumomediastinum.   Assessment/Plan Respiratory distress secondary to subcutaneous emphysema - resolved  Subcutaneous emphysema - etiology unclear, PCCM investigating, pt going for esophagram  S/p left nephrectomy 1/25 - per nephrology  Essential HTN - stable off meds. Can resume amlodipline, lisinopril, nebivolol whenever ready  Atrial fibrillation resolved s/p DCCV 2018 - resume nebivolol, apixaban when ready per urology  PMH esophageal dilation  Full code per patient.   Patient appears to have improved; chronic medical issues appear stable. As PCCM is involved and managing evaluation of subcutaneous emphysema, I will sign off for now. However, I am available through 1/29, please call if I can be of further assistance.  Murray Hodgkins, MD  [facility] Direct contact: [phone] --Via amion app OR  --[email or web address]; password [email or web address]  7PM-7AM contact night coverage as above 07/24/2017, 8:59 AM  LOS: 1 day   Interval history/Subjective: Feels better, no SOB, able to flex neck without cutting off air. Voice is still abnormal. No pain from surgery.  Objective: Vitals:  Vitals:   07/24/17 0700 07/24/17 0800  BP: 138/76 (!) 130/111  Pulse: 80 73  Resp: 20 17  Temp:    SpO2: 99% 99%    Exam:  Constitutional:  . Appears calm and comfortable Eyes:  . pupils and irises appear normal . Normal lids a ENMT:  . grossly normal hearing  . Lips appear normal Respiratory:  . CTA bilaterally, no w/r/r.   . Respiratory effort normal.  Cardiovascular:  . RRR, no m/r/g . No LE extremity edema   . Normal pedal pulses Skin:  . Subcutaneous emphysema noted face, neck, upper chest Psychiatric:  . Mental status o Mood, affect appropriate  I have personally reviewed the following:   Labs:  BMP noted  Hgb stable 10.2  Imaging studies:  CT chest noted    Scheduled Meds: . Chlorhexidine Gluconate Cloth  6 each Topical Q0600  . diphenhydrAMINE  50 mg Intravenous Once  . iopamidol      . mupirocin ointment  1 application Nasal BID  . pantoprazole (PROTONIX) IV  40 mg Intravenous Q24H   Continuous Infusions: . acetaminophen Stopped (07/24/17 6761)    Principal Problem:   Acute respiratory failure with hypoxia (HCC) Active Problems:   Essential hypertension   Kidney mass   Subcutaneous emphysema (HCC)   PAF (paroxysmal atrial fibrillation) (HCC)   History of pulmonary embolism   LOS: 1 day

## 2017-07-24 NOTE — Progress Notes (Signed)
Discussed CT read in detail with radiologist. He does not see any large proximal airway disruption but cannot rule out a smaller airway disruption or barotrauma. He likewise sees no signs of esophageal perforation but recommend esophagram with water soluble contrast in AM. He feels it is possible that this pneumomediastinum and subcutaneous emphysema could be due to the air insufflated intraoperatively.   Since patient remains stable, will keep in ICU at [facility] and will plan for esophagram in AM.

## 2017-07-24 NOTE — Progress Notes (Signed)
Pt nasal swab MRSA positive.  Order set placed.  Pt placed in contact isolation per protocol. Pt and dtr educated.

## 2017-07-24 NOTE — Progress Notes (Signed)
CRITICAL VALUE ALERT  Critical Value:  Positive MRSA nasal swab  Date & Time Notied:  07/24/17 @ 0145  Provider Notified: per protocol  Orders Received/Actions taken: MRSA positive standing orders placed per protocol

## 2017-07-24 NOTE — Consult Note (Signed)
Referring Provider:  Dr. Link Snuffer (Urology) Primary Care Physician:  Patient, No Pcp Per Primary Gastroenterologist:  None (previously seen by Dr. Teena Irani)  Reason for Consultation: Dysphagia, esophageal stricture, subcutaneous emphysema  HPI: Barry Curry is a 77 y.o. male who underwent a robotic left nephrectomy yesterday because of a 7 cm cystic mass, the path of which is pending at this time.  Following the procedure, he was noted to have subcutaneous emphysema with pneumomediastinum and pneumopericardium.  He had some degree of respiratory distress because of swelling in the neck area.  The patient has not had any violent retching to have created a Boerhaave syndrome; he is unaware as to whether or not an NG tube was passed during his anesthesia.  Fortunately, the patient has had a benign clinical course.  He never bumped his white count, is swelling and obstructive respiration has improved, and he is free of any significant pain or fever.  He underwent an esophagram using water-soluble contrast earlier today, which showed no evidence of extravasation or leak.  There was a suggestion of some narrowing of the distal esophagus, as well as some disordered peristalsis suggestive of presbyesophagus.  At baseline, the patient has long-standing but nonprogressive dysphagia symptoms to solid food, particularly "dry chicken."  It is slow to go down and sometimes will pause briefly in the lower esophageal region, prompting him to have to take a sip of water to wash it out.  He has not had any frank food impaction, where he has had to stop eating, and never has had to have endoscopic treatment of the food impaction.  He did undergo esophageal dilatation of a distal Schatzki's ring to 16 mm by Dr. Teena Irani in March 2012.  There is no problem with significant anorexia or progressive weight loss; the patient did lose weight sometime ago but then has leveled off.  No heartburn symptoms or reflux  symptomatology or regurgitation.  The patient was on anticoagulation prior to admission because of his previous atrial fibrillation, now status post cardioversion.   Past Medical History:  Diagnosis Date  . Anemia    history of  . Anxiety   . Aortic stenosis   . Arthritis   . Cancer ([place])    Melanoma back of neck  . Cancer of kidney ([place])   . Cardiomyopathy ([place])   . Complication of anesthesia    violent wake up from anesthesia  . Coronary artery disease   . Depression   . Dyspnea   . Esophageal stricture   . Heart murmur   . History of atrial fibrillation   . History of claustrophobia   . Hyperlipemia   . Hypertension   . Left kidney mass   . Lower extremity edema   . Mitral stenosis   . PVC (premature ventricular contraction)   . RBBB (right bundle branch block with left anterior fascicular block)     Past Surgical History:  Procedure Laterality Date  . CARDIOVERSION    . COLONOSCOPY    . ESOPHAGEAL DILATION    . KNEE ARTHROSCOPY Left   . KNEE ARTHROSCOPY Right   . SHOULDER ARTHROSCOPY N/A   . TOTAL HIP ARTHROPLASTY Left   . TOTAL HIP ARTHROPLASTY Right   . TOTAL KNEE ARTHROPLASTY Left   . TOTAL KNEE ARTHROPLASTY Right   . TOTAL SHOULDER REPLACEMENT Left   . TOTAL SHOULDER REPLACEMENT Right   . UPPER GI ENDOSCOPY      Prior to Admission medications  Medication Sig Start Date End Date Taking? Authorizing Provider  acetaminophen (TYLENOL) 500 MG tablet Take 500 mg by mouth 2 (two) times daily.   Yes [provider]  amLODipine (NORVASC) 10 MG tablet Take 10 mg by mouth daily.   Yes [provider]  aspirin EC 81 MG tablet Take 81 mg by mouth daily.   Yes [provider]  fluticasone (FLONASE) 50 MCG/ACT nasal spray Place 2 sprays into both nostrils at bedtime.   Yes [provider]  lisinopril (PRINIVIL,ZESTRIL) 20 MG tablet Take 10 mg by mouth daily.   Yes [provider]  loperamide (IMODIUM) 2 MG capsule Take 2  mg by mouth every other day.   Yes [provider]  nebivolol (BYSTOLIC) 10 MG tablet Take 10 mg by mouth daily.   Yes [provider]  pantoprazole (PROTONIX) 40 MG tablet Take 40 mg by mouth daily.   Yes [provider]  tamsulosin (FLOMAX) 0.4 MG CAPS capsule Take 0.4 mg by mouth daily after supper.   Yes [provider]  apixaban (ELIQUIS) 5 MG TABS tablet Take 5 mg by mouth 2 (two) times daily.    [provider]  traMADol (ULTRAM) 50 MG tablet Take 1 tablet (50 mg total) by mouth every 6 (six) hours as needed. 07/23/17 07/23/18  Alla Feeling, MD    Current Facility-Administered Medications  Medication Dose Route Frequency Provider Last Rate Last Dose  . 0.9 %  sodium chloride infusion   Intravenous Continuous Marton Redwood III, MD 75 mL/hr at 07/24/17 1231    . acetaminophen (OFIRMEV) IV 1,000 mg  1,000 mg Intravenous Q6H PRN Hammonds, Sharyn Blitz, MD   Stopped at 07/24/17 [phone]  . Chlorhexidine Gluconate Cloth 2 % PADS 6 each  6 each Topical Q0600 Rise Patience, MD      . diphenhydrAMINE (BENADRYL) injection 50 mg  50 mg Intravenous Once Marton Redwood III, MD      . heparin injection 5,000 Units  5,000 Units Subcutaneous Q8H Marton Redwood III, MD   5,000 Units at 07/24/17 1330  . lisinopril (PRINIVIL,ZESTRIL) tablet 10 mg  10 mg Oral Daily Marton Redwood III, MD   10 mg at 07/24/17 1517  . mupirocin ointment (BACTROBAN) 2 % 1 application  1 application Nasal BID Rise Patience, MD   1 application at 49/44/96 1124  . nebivolol (BYSTOLIC) tablet 10 mg  10 mg Oral Daily Marton Redwood III, MD   10 mg at 07/24/17 1517  . ondansetron (ZOFRAN) injection 4 mg  4 mg Intravenous Q4H PRN Narang, Gopal L, MD      . pantoprazole (PROTONIX) injection 40 mg  40 mg Intravenous Q24H Hammonds, Sharyn Blitz, MD   40 mg at 07/24/17 0320    Allergies as of 06/04/2017 - Review Complete 08/19/2012  Allergen Reaction Noted  . Rofecoxib  03/02/2007  .  Penicillins Rash 03/02/2007    History reviewed. No pertinent family history.  Social History   Socioeconomic History  . Marital status: Widowed    Spouse name: Not on file  . Number of children: Not on file  . Years of education: Not on file  . Highest education level: Not on file  Social Needs  . Financial resource strain: Not on file  . Food insecurity - worry: Not on file  . Food insecurity - inability: Not on file  . Transportation needs - medical: Not on file  . Transportation needs -  non-medical: Not on file  Occupational History  . Not on file  Tobacco Use  . Smoking status: Former Smoker    Packs/day: 0.20    Years: 2.00    Pack years: 0.40    Types: Cigarettes    Last attempt to quit: 07/16/1971    Years since quitting: 46.0  . Smokeless tobacco: Never Used  Substance and Sexual Activity  . Alcohol use: Yes    Comment: daily Scotch  . Drug use: No  . Sexual activity: Not on file  Other Topics Concern  . Not on file  Social History Narrative  . Not on file    Review of Systems: See history of present illness.  GI symptoms pertinent for the above-mentioned fair appetite, previous weight loss without progression, and also periodic leakage of stool which is fairly well controlled by use of Imodium as needed.  Physical Exam: Vital signs in last 24 hours: Temp:  [98 F (36.7 C)-98.7 F (37.1 C)] 98 F (36.7 C) (01/26 1600) Pulse Rate:  [68-109] 87 (01/26 1500) Resp:  [15-24] 22 (01/26 1500) BP: (130-183)/(68-113) 132/113 (01/26 1500) SpO2:  [97 %-100 %] 99 % (01/26 1500) Weight:  [100.3 kg (221 lb 1.9 oz)-101.6 kg (224 lb)] 100.3 kg (221 lb 1.9 oz) (01/25 2300) Last BM Date: 07/23/17 General:   Alert,  Well-developed, well-nourished, pleasant and cooperative in NAD Head:  Some facial swelling still present, with subcutaneous emphysema ("crackles" upon touching) Eyes:  Sclera clear, no icterus.   Mouth:   No ulcerations or lesions.  Oropharynx pink &  moist. Neck: Some mild residual swelling and subcutaneous emphysema present Lungs:  Clear throughout to auscultation.   No wheezes, crackles, or rhonchi. No evident respiratory distress. Heart:   Regular rate and rhythm; 3/6 harsh systolic murmur consistent with aortic stenosis Abdomen: Nontender, nontympanitic, and nondistended. No masses, hepatosplenomegaly or ventral hernias noted. Normal bowel sounds, without bruits, guarding, or rebound.   Msk:   Symmetrical without gross deformities. Neurologic:  Alert and coherent;  grossly normal neurologically. Skin: Subcutaneous emphysema in upper portion of body Psych:   Alert and cooperative. Normal mood and affect.  Intake/Output from previous day: 01/25 0701 - 01/26 0700 In: 4623.8 [P.O.:480; I.V.:3593.8; IV Piggyback:550] Out: 3220 [Urine:3170; Blood:50] Intake/Output this shift: Total I/O In: 336.3 [I.V.:336.3] Out: 900 [Urine:900]  Lab Results: Recent Labs    07/23/17 1436 07/23/17 2355 07/24/17 0303  WBC  --  8.6  --   HGB 9.9* 10.1* 10.2*  HCT 31.2* 31.7* 31.7*  PLT  --  167  --    BMET Recent Labs    07/23/17 2355 07/24/17 0303  NA 137 135  K 4.9 4.7  CL 106 103  CO2 26 25  GLUCOSE 137* 123*  BUN 17 17  CREATININE 1.27* 1.33*  CALCIUM 8.6* 8.7*   LFT Recent Labs    07/23/17 2355  PROT 6.4*  ALBUMIN 3.8  AST 29  ALT 15*  ALKPHOS 66  BILITOT 0.6   PT/INR No results for input(s): LABPROT, INR in the last 72 hours.  Studies/Results: Dg Neck Soft Tissue  Result Date: 07/23/2017 CLINICAL DATA:  Evaluate for esophageal rupture. Left nephrectomy today. History of esophageal stricture, hypertension, renal mass, former smoker. Shortness of breath. EXAM: CHEST 1 VIEW, NECK SOFT TISSUES 2 VIEW COMPARISON:  CT chest 03/27/2010.  Chest 03/27/2010. FINDINGS: There is extensive subcutaneous emphysema throughout the neck and chest. Mediastinal emphysema in the upper mediastinum. Etiology would be nonspecific but  compatible  with esophageal rupture in the appropriate clinical setting. In the recent postoperative patient, tracheal injury or barotrauma could also have this appearance. No pneumothorax is identified although the overlying subcutaneous emphysema could obscure a small pneumothorax. Shallow inspiration. Cardiac enlargement. Pulmonary vascularity appears normal. Calcified granulomas in the right hilum. No focal consolidation. Calcification of the aorta. Postoperative changes in both shoulders. DEGENERATIVE CHANGES IN THE CERVICAL SPINE. IMPRESSION: Extensive subcutaneous emphysema throughout the neck and chest with upper pneumomediastinum. Changes are compatible with esophageal rupture in the appropriate clinical setting. Tracheal injury or barotrauma could also have this appearance. Cardiac enlargement. These results were called by telephone at the time of interpretation on 07/23/2017 at 11:36 pm to the patient's nurse, [place], who verbally acknowledged these results. Electronically Signed   By: Lucienne Capers M.D.   On: 07/23/2017 23:46   Dg Chest 1 View  Result Date: 07/23/2017 CLINICAL DATA:  Evaluate for esophageal rupture. Left nephrectomy today. History of esophageal stricture, hypertension, renal mass, former smoker. Shortness of breath. EXAM: CHEST 1 VIEW, NECK SOFT TISSUES 2 VIEW COMPARISON:  CT chest 03/27/2010.  Chest 03/27/2010. FINDINGS: There is extensive subcutaneous emphysema throughout the neck and chest. Mediastinal emphysema in the upper mediastinum. Etiology would be nonspecific but compatible with esophageal rupture in the appropriate clinical setting. In the recent postoperative patient, tracheal injury or barotrauma could also have this appearance. No pneumothorax is identified although the overlying subcutaneous emphysema could obscure a small pneumothorax. Shallow inspiration. Cardiac enlargement. Pulmonary vascularity appears normal. Calcified granulomas in the right hilum. No focal  consolidation. Calcification of the aorta. Postoperative changes in both shoulders. DEGENERATIVE CHANGES IN THE CERVICAL SPINE. IMPRESSION: Extensive subcutaneous emphysema throughout the neck and chest with upper pneumomediastinum. Changes are compatible with esophageal rupture in the appropriate clinical setting. Tracheal injury or barotrauma could also have this appearance. Cardiac enlargement. These results were called by telephone at the time of interpretation on 07/23/2017 at 11:36 pm to the patient's nurse, [place], who verbally acknowledged these results. Electronically Signed   By: Lucienne Capers M.D.   On: 07/23/2017 23:46   Ct Chest W Contrast  Result Date: 07/24/2017 CLINICAL DATA:  Recent nephrectomy. Extensive subcutaneous emphysema in the neck and chest. EXAM: CT CHEST WITH CONTRAST TECHNIQUE: Multidetector CT imaging of the chest was performed during intravenous contrast administration. CONTRAST:  55mL ISOVUE-300 IOPAMIDOL (ISOVUE-300) INJECTION 61% COMPARISON:  Chest radiograph 07/23/2017, CT chest 03/27/2010 FINDINGS: Cardiovascular: Normal heart size. No pericardial effusion. Coronary artery and aortic calcifications. Normal caliber aorta. Calcific and noncalcific plaque formation in the origin of the right brachiocephalic artery causes some narrowing although the vessel remains patent. Mediastinum/Nodes: Diffuse pneumomediastinum and pneumopericardium. Esophagus is fluid-filled and dilated down to the level of the subcarinal region. The distal esophagus is decompressed. This may represent an esophageal stricture. No significant lymphadenopathy in the mediastinum. Calcified lymph nodes are present in the right hilum. Lungs/Pleura: No pneumothorax. Lungs are clear and expanded except for atelectasis in the bases. Minimal left pleural effusion. Airways are patent. Upper Abdomen: Free intraperitoneal air is demonstrated in the upper abdomen, likely dissecting down from the chest and mediastinum.  Low-attenuation lesion in the right lobe of the liver probably represents a hemangioma. Gallbladder is distended without stone or wall thickening. Stomach is decompressed. Calcified granulomas in the spleen. Small amount of free fluid in the upper abdomen around the spleen. Musculoskeletal: Degenerative changes throughout the spine. No destructive bone lesions. Postoperative changes with bilateral shoulder replacements. IMPRESSION: 1. Prominent diffuse subcutaneous emphysema, pneumomediastinum,  pneumopericardium, and pneumoperitoneum. Etiology could be due to barotrauma or esophageal perforation. Lack of mediastinal fluid would make esophageal perforation less likely. 2. Proximal esophagus is dilated with decompression of the distal esophagus from the level of the carina. This may indicate esophageal stricture. 3. Small left pleural effusion and small amount of free fluid in the upper abdomen around the spleen. Electronically Signed   By: Lucienne Capers M.D.   On: 07/24/2017 03:28   Dg Esophagus W/water Sol Cm  Result Date: 07/24/2017 CLINICAL DATA:  Subcutaneous emphysema of the neck and chest and pneumomediastinum following robotic assisted laparoscopic left nephrectomy yesterday. EXAM: ESOPHOGRAM/BARIUM SWALLOW TECHNIQUE: Single contrast examination was performed using water-soluble contrast. FLUOROSCOPY TIME:  Fluoroscopy Time:  1 minutes and 54 seconds. Radiation Exposure Index (if provided by the fluoroscopic device): 62.1 mGy COMPARISON:  CT of the chest on 07/24/2017 FINDINGS: The examination demonstrates no evidence of esophageal rupture or leak. There is mild dilatation of the mid esophagus that tapers distally to the level of a distal esophageal stricture which appears smoothly tapered. Stricture is moderate and not severe with visible transit of liquid in the esophagus and into the stomach. No evidence of associated hiatal hernia or visible gastroesophageal reflux during the procedure. There is  some associated presbyesophagus with mild delay in motility. IMPRESSION: No evidence of esophageal rupture or leak. Smoothly tapered moderate distal esophageal stricture which is not restrictive to transit of liquid. 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 Electronically Signed   By: Aletta Edouard M.D.   On: 07/24/2017 11:02    Impression: 1.  Subcutaneous emphysema with pneumopericardium and pneumomediastinum following robotic nephrectomy, improving  2.  No evidence of esophageal perforation clinically or radiographically  3.  Mild dysphagia symptoms with radiographic suggestion of distal esophageal stricture  Plan: 1.  From the GI tract standpoint, I agree with a conservative diet as is currently ordered although I think that the likelihood that this patient had an esophageal perforation which has sealed over is very low.  Infragenicular, I would not favor endoscopic evaluation at this time, in part because of the low likelihood of disclosing any esophageal injury, but also because, in the unlikely event that such an injury were present, it might be made worse by the gas insufflation and mechanical manipulation associated with endoscopy.  2.  The patient's dysphagia symptoms are so easily controlled by dietary maneuvers (see above discussion) that I do not feel that endoscopic dilatation is needed  3.  I will ask the patient to follow-up with [place] in the office in a month or so, simply to monitor his progress and make sure that no further esophageal evaluation or treatment is warranted   LOS: 1 day   Jhonatan Lomeli V  07/24/2017, 4:09 PM   Pager [phone] If no answer or after 5 PM call [phone]

## 2017-07-24 NOTE — Progress Notes (Signed)
Pt able to safely take PO if sitting at 90 degrees on the edge of the bed or in chair.

## 2017-07-24 NOTE — Progress Notes (Signed)
CRITICAL VALUE ALERT  Critical Value:  Xray chest and neck:  Extensive SQ empysema t/o neck and chest with upper pneumomediastinum.  Changes compatible with esophageal rupture, tracheal injury, or barotrauma.  Date & Time Notied:  07/23/17 @ 2346  Provider Notified: Dr. Jimmy Footman  Orders Received/Actions taken: In house PCCM to come see patient.

## 2017-07-24 NOTE — Progress Notes (Signed)
Name: Barry Curry MRN: [id number] DOB: 1941-01-04    ADMISSION DATE:  07/23/2017 CONSULTATION DATE:  07/23/2017  REFERRING MD :  Dr. Tresa Moore  CHIEF COMPLAINT:  Subcutaneous Emphysema   HISTORY OF PRESENT ILLNESS:   77 year old male former smoker with PMH of Anemia, Anxiety, Aortic/Mitral Stenosis, Melanoma, HTN, Cardiomyopathy, CAD, Depression, Esophageal Stricture, A.Fib (S/P cardioversion 2018), HLD, HTN, Left upper cystic mass found (12/2016)  Underwent  1/25 for scheduled left robotic radical nephrectomy in setting of 7 cm left upper cystic enhancing mass.Developed extensive subcutaneous emphysema throughout neck and chest with upper pneumomediastinum. PCCM asked to consult  .   SIGNIFICANT EVENTS  1/25 > Presents for radical nephrectomy   STUDIES:  CT chest 1/26 >>Prominent diffuse subcutaneous emphysema, pneumomediastinum, pneumopericardium, and pneumoperitoneum. Proximal esophagus is dilated with decompression of the distal esophagus from the level of the carina. This may indicate esophageal stricture.   SUBJECTIVE: no resp distress Able to speak in full sentences Face & neck swelling down per RN & daughter Voice nasal twang but improved  VITAL SIGNS: Temp:  [97.5 F (36.4 C)-98.7 F (37.1 C)] 98.7 F (37.1 C) (01/26 0354) Pulse Rate:  [68-109] 73 (01/26 0800) Resp:  [15-24] 17 (01/26 0800) BP: (104-183)/(60-111) 130/111 (01/26 0800) SpO2:  [96 %-100 %] 99 % (01/26 0800) Weight:  [221 lb (100.2 kg)-224 lb (101.6 kg)] 221 lb 1.9 oz (100.3 kg) (01/25 2300)  PHYSICAL EXAMINATION: General: WDWN Elderly male lying in ICU bed in NAD, O2 via [place] in place  Neuro: Awake/alert, interactive, non focal Voice has nasal twang HEENT:  mallampati 4, normal posterior oropharynx. No swelling of uvula or tongue. Tonsils are surgically absent. Cardiovascular: RRR no m/r/g Lungs: CTA  BL chest wall crepitus extending into lower neck Abdomen: Soft nondistended, larger surgical  incision as well as smaller laparoscopic incisions are clean and intact;Mild tenderness over the larger incision but overall non-tender exam Musculoskeletal: no LE edema  Skin: no rashes   Recent Labs  Lab 07/23/17 2355 07/24/17 0303  NA 137 135  K 4.9 4.7  CL 106 103  CO2 26 25  BUN 17 17  CREATININE 1.27* 1.33*  GLUCOSE 137* 123*   Recent Labs  Lab 07/19/17 1137 07/23/17 1436 07/23/17 2355 07/24/17 0303  HGB 11.0* 9.9* 10.1* 10.2*  HCT 34.6* 31.2* 31.7* 31.7*  WBC 7.4  --  8.6  --   PLT 185  --  167  --    Dg Neck Soft Tissue  Result Date: 07/23/2017 CLINICAL DATA:  Evaluate for esophageal rupture. Left nephrectomy today. History of esophageal stricture, hypertension, renal mass, former smoker. Shortness of breath. EXAM: CHEST 1 VIEW, NECK SOFT TISSUES 2 VIEW COMPARISON:  CT chest 03/27/2010.  Chest 03/27/2010. FINDINGS: There is extensive subcutaneous emphysema throughout the neck and chest. Mediastinal emphysema in the upper mediastinum. Etiology would be nonspecific but compatible with esophageal rupture in the appropriate clinical setting. In the recent postoperative patient, tracheal injury or barotrauma could also have this appearance. No pneumothorax is identified although the overlying subcutaneous emphysema could obscure a small pneumothorax. Shallow inspiration. Cardiac enlargement. Pulmonary vascularity appears normal. Calcified granulomas in the right hilum. No focal consolidation. Calcification of the aorta. Postoperative changes in both shoulders. DEGENERATIVE CHANGES IN THE CERVICAL SPINE. IMPRESSION: Extensive subcutaneous emphysema throughout the neck and chest with upper pneumomediastinum. Changes are compatible with esophageal rupture in the appropriate clinical setting. Tracheal injury or barotrauma could also have this appearance. Cardiac enlargement. These results were called  by telephone at the time of interpretation on 07/23/2017 at 11:36 pm to the patient's  nurse, [place], who verbally acknowledged these results. Electronically Signed   By: Lucienne Capers M.D.   On: 07/23/2017 23:46   Dg Chest 1 View  Result Date: 07/23/2017 CLINICAL DATA:  Evaluate for esophageal rupture. Left nephrectomy today. History of esophageal stricture, hypertension, renal mass, former smoker. Shortness of breath. EXAM: CHEST 1 VIEW, NECK SOFT TISSUES 2 VIEW COMPARISON:  CT chest 03/27/2010.  Chest 03/27/2010. FINDINGS: There is extensive subcutaneous emphysema throughout the neck and chest. Mediastinal emphysema in the upper mediastinum. Etiology would be nonspecific but compatible with esophageal rupture in the appropriate clinical setting. In the recent postoperative patient, tracheal injury or barotrauma could also have this appearance. No pneumothorax is identified although the overlying subcutaneous emphysema could obscure a small pneumothorax. Shallow inspiration. Cardiac enlargement. Pulmonary vascularity appears normal. Calcified granulomas in the right hilum. No focal consolidation. Calcification of the aorta. Postoperative changes in both shoulders. DEGENERATIVE CHANGES IN THE CERVICAL SPINE. IMPRESSION: Extensive subcutaneous emphysema throughout the neck and chest with upper pneumomediastinum. Changes are compatible with esophageal rupture in the appropriate clinical setting. Tracheal injury or barotrauma could also have this appearance. Cardiac enlargement. These results were called by telephone at the time of interpretation on 07/23/2017 at 11:36 pm to the patient's nurse, [place], who verbally acknowledged these results. Electronically Signed   By: Lucienne Capers M.D.   On: 07/23/2017 23:46   Ct Chest W Contrast  Result Date: 07/24/2017 CLINICAL DATA:  Recent nephrectomy. Extensive subcutaneous emphysema in the neck and chest. EXAM: CT CHEST WITH CONTRAST TECHNIQUE: Multidetector CT imaging of the chest was performed during intravenous contrast administration. CONTRAST:   60mL ISOVUE-300 IOPAMIDOL (ISOVUE-300) INJECTION 61% COMPARISON:  Chest radiograph 07/23/2017, CT chest 03/27/2010 FINDINGS: Cardiovascular: Normal heart size. No pericardial effusion. Coronary artery and aortic calcifications. Normal caliber aorta. Calcific and noncalcific plaque formation in the origin of the right brachiocephalic artery causes some narrowing although the vessel remains patent. Mediastinum/Nodes: Diffuse pneumomediastinum and pneumopericardium. Esophagus is fluid-filled and dilated down to the level of the subcarinal region. The distal esophagus is decompressed. This may represent an esophageal stricture. No significant lymphadenopathy in the mediastinum. Calcified lymph nodes are present in the right hilum. Lungs/Pleura: No pneumothorax. Lungs are clear and expanded except for atelectasis in the bases. Minimal left pleural effusion. Airways are patent. Upper Abdomen: Free intraperitoneal air is demonstrated in the upper abdomen, likely dissecting down from the chest and mediastinum. Low-attenuation lesion in the right lobe of the liver probably represents a hemangioma. Gallbladder is distended without stone or wall thickening. Stomach is decompressed. Calcified granulomas in the spleen. Small amount of free fluid in the upper abdomen around the spleen. Musculoskeletal: Degenerative changes throughout the spine. No destructive bone lesions. Postoperative changes with bilateral shoulder replacements. IMPRESSION: 1. Prominent diffuse subcutaneous emphysema, pneumomediastinum, pneumopericardium, and pneumoperitoneum. Etiology could be due to barotrauma or esophageal perforation. Lack of mediastinal fluid would make esophageal perforation less likely. 2. Proximal esophagus is dilated with decompression of the distal esophagus from the level of the carina. This may indicate esophageal stricture. 3. Small left pleural effusion and small amount of free fluid in the upper abdomen around the spleen.  Electronically Signed   By: Lucienne Capers M.D.   On: 07/24/2017 03:28    ASSESSMENT / PLAN:  1. Respiratory distress (resolved); Pneumomediastinum; Subcutaneous emphysema: -Most likely due to air insufflation during procedure - note that pneumoperitoneum > air in  chest - DD includes bronchial rupture with bougie or esophageal leak with pre-existing stricture - less likely  - Obtain esophagram to clarify  DYsphonia - improving  Observe I ICU for now Keep npo  Updated daughter at bedside  Kara Mead MD. [place]. [facility] Pager [phone] If no response call [phone]     07/24/2017, 8:52 AM

## 2017-07-24 NOTE — Progress Notes (Signed)
Urology Inpatient Progress Report  LEFT RENAL MASS  Procedure(s): XI ROBOTIC ASSISTED LAPAROSCOPIC NEPHRECTOMY  1 Day Post-Op   Intv/Subj: Last night, the patient developed acute onset of facial and throat swelling, respiratory distress, and subcutaneous emphysema was noted on physical exam.  He was transferred to the stepdown unit and Drs. Kakrakandy and Hammonds evaluated him.  Chest x-ray and neck x-ray revealed significant subcutaneous emphysema for which a esophageal rupture could not be excluded.  He had a follow-up CT scan that showed no rupture but again could not completely exclude.  This morning, he underwent a barium swallow study which revealed no evidence of esophageal rupture or leak.  There was a moderate distal esophageal stricture that was not restrictive to transit of liquid.  There is mild delay in motility.  The patient is up out of bed and into chair and states he is feeling much improved.  Minimal pain.  Currently n.p.o.  Hemoglobin stable.  Afebrile vital signs stable.  Great urine output.  Creatinine is up a slight amount to 1.33 from 1.27 which is not unexpected given the recent nephrectomy. Patient is without complaint.  Gastroenterology plans to see the patient today.  Principal Problem:   Acute respiratory failure with hypoxia (HCC) Active Problems:   Essential hypertension   Kidney mass   Subcutaneous emphysema (HCC)   PAF (paroxysmal atrial fibrillation) (HCC)   History of pulmonary embolism  Current Facility-Administered Medications  Medication Dose Route Frequency Provider Last Rate Last Dose  . 0.9 %  sodium chloride infusion   Intravenous Continuous Marton Redwood III, MD      . acetaminophen (OFIRMEV) IV 1,000 mg  1,000 mg Intravenous Q6H PRN Hammonds, Sharyn Blitz, MD   Stopped at 07/24/17 [phone]  . Chlorhexidine Gluconate Cloth 2 % PADS 6 each  6 each Topical Q0600 Rise Patience, MD      . diphenhydrAMINE (BENADRYL) injection 50 mg  50 mg Intravenous  Once Marton Redwood III, MD      . heparin injection 5,000 Units  5,000 Units Subcutaneous Q8H Marton Redwood III, MD      . iopamidol (ISOVUE-300) 61 % injection           . mupirocin ointment (BACTROBAN) 2 % 1 application  1 application Nasal BID Rise Patience, MD   1 application at 16/60/63 1124  . ondansetron (ZOFRAN) injection 4 mg  4 mg Intravenous Q4H PRN Narang, Gopal L, MD      . pantoprazole (PROTONIX) injection 40 mg  40 mg Intravenous Q24H Hammonds, Sharyn Blitz, MD   40 mg at 07/24/17 0320     Objective: Vital: Vitals:   07/24/17 0600 07/24/17 0700 07/24/17 0800 07/24/17 0900  BP: (!) 183/93 138/76 (!) 130/111 139/68  Pulse: 89 80 73 69  Resp: 18 20 17 16   Temp:      TempSrc:      SpO2: 100% 99% 99% 99%  Weight:      Height:       I/Os: I/O last 3 completed shifts: In: 4623.8 [P.O.:480; I.V.:3593.8; IV Piggyback:550] Out: 0160 [Urine:3170; Blood:50]  Physical Exam:  General: Patient is in no apparent distress, there is persistent subcutaneous emphysema extending from the abdomen up to the chest surrounding the neck and on the left side of his face.  This has improved.  Swelling is much improved as well. Lungs: Normal respiratory effort, chest expands symmetrically.  He has no respiratory distress.  Oxygen saturations normal. GI: Incisions  are c/d/i. The abdomen is soft and nontender without mass. Foley: Draining clear yellow urine Ext: lower extremities symmetric  Lab Results: Recent Labs    07/23/17 1436 07/23/17 2355 07/24/17 0303  WBC  --  8.6  --   HGB 9.9* 10.1* 10.2*  HCT 31.2* 31.7* 31.7*   Recent Labs    07/23/17 2355 07/24/17 0303  NA 137 135  K 4.9 4.7  CL 106 103  CO2 26 25  GLUCOSE 137* 123*  BUN 17 17  CREATININE 1.27* 1.33*  CALCIUM 8.6* 8.7*   No results for input(s): LABPT, INR in the last 72 hours. No results for input(s): LABURIN in the last 72 hours. Results for orders placed or performed during the hospital encounter of  07/23/17  MRSA PCR Screening     Status: Abnormal   Collection Time: 07/23/17 11:10 PM  Result Value Ref Range Status   MRSA by PCR POSITIVE (A) NEGATIVE Final    Comment: RESULT CALLED TO, READ BACK BY AND VERIFIED WITH: M HOPPER,RN @0147  07/24/16 MKELLY        The GeneXpert MRSA Assay (FDA approved for NASAL specimens only), is one component of a comprehensive MRSA colonization surveillance program. It is not intended to diagnose MRSA infection nor to guide or monitor treatment for MRSA infections.     Studies/Results: Dg Neck Soft Tissue  Result Date: 07/23/2017 CLINICAL DATA:  Evaluate for esophageal rupture. Left nephrectomy today. History of esophageal stricture, hypertension, renal mass, former smoker. Shortness of breath. EXAM: CHEST 1 VIEW, NECK SOFT TISSUES 2 VIEW COMPARISON:  CT chest 03/27/2010.  Chest 03/27/2010. FINDINGS: There is extensive subcutaneous emphysema throughout the neck and chest. Mediastinal emphysema in the upper mediastinum. Etiology would be nonspecific but compatible with esophageal rupture in the appropriate clinical setting. In the recent postoperative patient, tracheal injury or barotrauma could also have this appearance. No pneumothorax is identified although the overlying subcutaneous emphysema could obscure a small pneumothorax. Shallow inspiration. Cardiac enlargement. Pulmonary vascularity appears normal. Calcified granulomas in the right hilum. No focal consolidation. Calcification of the aorta. Postoperative changes in both shoulders. DEGENERATIVE CHANGES IN THE CERVICAL SPINE. IMPRESSION: Extensive subcutaneous emphysema throughout the neck and chest with upper pneumomediastinum. Changes are compatible with esophageal rupture in the appropriate clinical setting. Tracheal injury or barotrauma could also have this appearance. Cardiac enlargement. These results were called by telephone at the time of interpretation on 07/23/2017 at 11:36 pm to the  patient's nurse, [place], who verbally acknowledged these results. Electronically Signed   By: Lucienne Capers M.D.   On: 07/23/2017 23:46   Dg Chest 1 View  Result Date: 07/23/2017 CLINICAL DATA:  Evaluate for esophageal rupture. Left nephrectomy today. History of esophageal stricture, hypertension, renal mass, former smoker. Shortness of breath. EXAM: CHEST 1 VIEW, NECK SOFT TISSUES 2 VIEW COMPARISON:  CT chest 03/27/2010.  Chest 03/27/2010. FINDINGS: There is extensive subcutaneous emphysema throughout the neck and chest. Mediastinal emphysema in the upper mediastinum. Etiology would be nonspecific but compatible with esophageal rupture in the appropriate clinical setting. In the recent postoperative patient, tracheal injury or barotrauma could also have this appearance. No pneumothorax is identified although the overlying subcutaneous emphysema could obscure a small pneumothorax. Shallow inspiration. Cardiac enlargement. Pulmonary vascularity appears normal. Calcified granulomas in the right hilum. No focal consolidation. Calcification of the aorta. Postoperative changes in both shoulders. DEGENERATIVE CHANGES IN THE CERVICAL SPINE. IMPRESSION: Extensive subcutaneous emphysema throughout the neck and chest with upper pneumomediastinum. Changes are compatible with esophageal  rupture in the appropriate clinical setting. Tracheal injury or barotrauma could also have this appearance. Cardiac enlargement. These results were called by telephone at the time of interpretation on 07/23/2017 at 11:36 pm to the patient's nurse, [place], who verbally acknowledged these results. Electronically Signed   By: Lucienne Capers M.D.   On: 07/23/2017 23:46   Ct Chest W Contrast  Result Date: 07/24/2017 CLINICAL DATA:  Recent nephrectomy. Extensive subcutaneous emphysema in the neck and chest. EXAM: CT CHEST WITH CONTRAST TECHNIQUE: Multidetector CT imaging of the chest was performed during intravenous contrast administration.  CONTRAST:  82mL ISOVUE-300 IOPAMIDOL (ISOVUE-300) INJECTION 61% COMPARISON:  Chest radiograph 07/23/2017, CT chest 03/27/2010 FINDINGS: Cardiovascular: Normal heart size. No pericardial effusion. Coronary artery and aortic calcifications. Normal caliber aorta. Calcific and noncalcific plaque formation in the origin of the right brachiocephalic artery causes some narrowing although the vessel remains patent. Mediastinum/Nodes: Diffuse pneumomediastinum and pneumopericardium. Esophagus is fluid-filled and dilated down to the level of the subcarinal region. The distal esophagus is decompressed. This may represent an esophageal stricture. No significant lymphadenopathy in the mediastinum. Calcified lymph nodes are present in the right hilum. Lungs/Pleura: No pneumothorax. Lungs are clear and expanded except for atelectasis in the bases. Minimal left pleural effusion. Airways are patent. Upper Abdomen: Free intraperitoneal air is demonstrated in the upper abdomen, likely dissecting down from the chest and mediastinum. Low-attenuation lesion in the right lobe of the liver probably represents a hemangioma. Gallbladder is distended without stone or wall thickening. Stomach is decompressed. Calcified granulomas in the spleen. Small amount of free fluid in the upper abdomen around the spleen. Musculoskeletal: Degenerative changes throughout the spine. No destructive bone lesions. Postoperative changes with bilateral shoulder replacements. IMPRESSION: 1. Prominent diffuse subcutaneous emphysema, pneumomediastinum, pneumopericardium, and pneumoperitoneum. Etiology could be due to barotrauma or esophageal perforation. Lack of mediastinal fluid would make esophageal perforation less likely. 2. Proximal esophagus is dilated with decompression of the distal esophagus from the level of the carina. This may indicate esophageal stricture. 3. Small left pleural effusion and small amount of free fluid in the upper abdomen around the  spleen. Electronically Signed   By: Lucienne Capers M.D.   On: 07/24/2017 03:28   Dg Esophagus W/water Sol Cm  Result Date: 07/24/2017 CLINICAL DATA:  Subcutaneous emphysema of the neck and chest and pneumomediastinum following robotic assisted laparoscopic left nephrectomy yesterday. EXAM: ESOPHOGRAM/BARIUM SWALLOW TECHNIQUE: Single contrast examination was performed using water-soluble contrast. FLUOROSCOPY TIME:  Fluoroscopy Time:  1 minutes and 54 seconds. Radiation Exposure Index (if provided by the fluoroscopic device): 62.1 mGy COMPARISON:  CT of the chest on 07/24/2017 FINDINGS: The examination demonstrates no evidence of esophageal rupture or leak. There is mild dilatation of the mid esophagus that tapers distally to the level of a distal esophageal stricture which appears smoothly tapered. Stricture is moderate and not severe with visible transit of liquid in the esophagus and into the stomach. No evidence of associated hiatal hernia or visible gastroesophageal reflux during the procedure. 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 IMPRESSION: No evidence of esophageal rupture or leak. Smoothly tapered moderate distal esophageal stricture which is not restrictive to transit of liquid. 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 Electronically Signed   By: Aletta Edouard M.D.   On: 07/24/2017 11:02    Assessment: 1.  Left renal mass 2.  Subcutaneous emphysema likely secondary to reset robotic surgery and CO2 insufflation Procedure(s): XI ROBOTIC ASSISTED LAPAROSCOPIC NEPHRECTOMY, 1 Day  Post-Op  doing well.  Plan: Imaging studies have essentially ruled out any serious pathology that may cause significant subcutaneous emphysema and this is also improving.  Suspect this is from migration of CO2 from a displaced trocar during the robotic surgery. -Discontinue Foley -Add heparin for DVT prophylaxis, SCDs -Out of bed, ambulate, incentive  spirometry -Advance diet slowly-.  Will start with clear liquid sips and if he does well with this throughout the day, advance to full liquids and if he does well with that to the night we will likely advance to general tomorrow. -P.o. pain medication with IV breakthrough -Appreciate the assistance from internal medicine, critical care, and gastroenterology    Link Snuffer, MD Urology 07/24/2017, 12:19 PM

## 2017-07-25 ENCOUNTER — Inpatient Hospital Stay (HOSPITAL_COMMUNITY): Payer: Medicare Other

## 2017-07-25 MED ORDER — FENTANYL CITRATE (PF) 100 MCG/2ML IJ SOLN
25.0000 ug | INTRAMUSCULAR | Status: DC | PRN
  Administered 2017-07-25 (×3): 50 ug via INTRAVENOUS
  Filled 2017-07-25 (×4): qty 2

## 2017-07-25 NOTE — Progress Notes (Signed)
Urology Inpatient Progress Report  LEFT RENAL MASS  Procedure(s): XI ROBOTIC ASSISTED LAPAROSCOPIC NEPHRECTOMY  2 Days Post-Op   Intv/Subj: No acute events overnight. Patient is without complaint. Tolerating full liquid diet.  Pain is well controlled.  He is ambulating well. Gastroenterology, Dr. Cristina Gong, saw the patient yesterday and recommended outpatient follow-up. Pulmonology/critical care Dr. Elsworth Soho saw the patient this morning and agrees with advancing diet but observing another night.  The patient still has mild dysphonia but no longer feels any discomfort.  He is voiding well.  Principal Problem:   Acute respiratory failure with hypoxia (HCC) Active Problems:   Essential hypertension   Kidney mass   Subcutaneous emphysema (HCC)   PAF (paroxysmal atrial fibrillation) (HCC)   History of pulmonary embolism  Current Facility-Administered Medications  Medication Dose Route Frequency Provider Last Rate Last Dose  . Chlorhexidine Gluconate Cloth 2 % PADS 6 each  6 each Topical Q0600 Rise Patience, MD   6 each at 07/25/17 0454  . diphenhydrAMINE (BENADRYL) injection 50 mg  50 mg Intravenous Once Marton Redwood III, MD      . fentaNYL (SUBLIMAZE) injection 25-50 mcg  25-50 mcg Intravenous Q2H PRN Deterding, Guadelupe Sabin, MD   50 mcg at 07/25/17 0045  . heparin injection 5,000 Units  5,000 Units Subcutaneous Q8H Marton Redwood III, MD   5,000 Units at 07/25/17 [phone]  . lisinopril (PRINIVIL,ZESTRIL) tablet 10 mg  10 mg Oral Daily Marton Redwood III, MD   10 mg at 07/25/17 6237  . mupirocin ointment (BACTROBAN) 2 % 1 application  1 application Nasal BID Rise Patience, MD   1 application at 62/83/15 0908  . nebivolol (BYSTOLIC) tablet 10 mg  10 mg Oral Daily Marton Redwood III, MD   10 mg at 07/25/17 1761  . ondansetron (ZOFRAN) injection 4 mg  4 mg Intravenous Q4H PRN Narang, Gopal L, MD      . opium-belladonna (B&O SUPPRETTES) 16.2-60 MG suppository 1 suppository  1  suppository Rectal Q8H PRN Marton Redwood III, MD   1 suppository at 07/24/17 2015  . pantoprazole (PROTONIX) injection 40 mg  40 mg Intravenous Q24H Hammonds, Sharyn Blitz, MD   40 mg at 07/25/17 0100     Objective: Vital: Vitals:   07/25/17 0345 07/25/17 0454 07/25/17 0800 07/25/17 0907  BP:  (!) 145/73 (!) 145/63 (!) 109/56  Pulse:   74   Resp:   17   Temp: 98.6 F (37 C)  98.7 F (37.1 C)   TempSrc: Oral  Oral   SpO2:   93%   Weight:      Height:       I/Os: I/O last 3 completed shifts: In: 2361.3 [I.V.:2061.3; IV Piggyback:300] Out: 3795 [Urine:3795]  Physical Exam:  General: Patient is in no apparent distress, still has some dysphonia Lungs: Normal respiratory effort, chest expands symmetrically.  Subcutaneous emphysema improving GI: Incisions are c/d/i. The abdomen is soft and mildly tender without mass. Ext: lower extremities symmetric  Lab Results: Recent Labs    07/23/17 1436 07/23/17 2355 07/24/17 0303  WBC  --  8.6  --   HGB 9.9* 10.1* 10.2*  HCT 31.2* 31.7* 31.7*   Recent Labs    07/23/17 2355 07/24/17 0303  NA 137 135  K 4.9 4.7  CL 106 103  CO2 26 25  GLUCOSE 137* 123*  BUN 17 17  CREATININE 1.27* 1.33*  CALCIUM 8.6* 8.7*   No results for input(s): LABPT,  INR in the last 72 hours. No results for input(s): LABURIN in the last 72 hours. Results for orders placed or performed during the hospital encounter of 07/23/17  MRSA PCR Screening     Status: Abnormal   Collection Time: 07/23/17 11:10 PM  Result Value Ref Range Status   MRSA by PCR POSITIVE (A) NEGATIVE Final    Comment: RESULT CALLED TO, READ BACK BY AND VERIFIED WITH: M HOPPER,RN @0147  07/24/16 MKELLY        The GeneXpert MRSA Assay (FDA approved for NASAL specimens only), is one component of a comprehensive MRSA colonization surveillance program. It is not intended to diagnose MRSA infection nor to guide or monitor treatment for MRSA infections.     Studies/Results: Dg  Neck Soft Tissue  Result Date: 07/23/2017 CLINICAL DATA:  Evaluate for esophageal rupture. Left nephrectomy today. History of esophageal stricture, hypertension, renal mass, former smoker. Shortness of breath. EXAM: CHEST 1 VIEW, NECK SOFT TISSUES 2 VIEW COMPARISON:  CT chest 03/27/2010.  Chest 03/27/2010. FINDINGS: There is extensive subcutaneous emphysema throughout the neck and chest. Mediastinal emphysema in the upper mediastinum. Etiology would be nonspecific but compatible with esophageal rupture in the appropriate clinical setting. In the recent postoperative patient, tracheal injury or barotrauma could also have this appearance. No pneumothorax is identified although the overlying subcutaneous emphysema could obscure a small pneumothorax. Shallow inspiration. Cardiac enlargement. Pulmonary vascularity appears normal. Calcified granulomas in the right hilum. No focal consolidation. Calcification of the aorta. Postoperative changes in both shoulders. DEGENERATIVE CHANGES IN THE CERVICAL SPINE. IMPRESSION: Extensive subcutaneous emphysema throughout the neck and chest with upper pneumomediastinum. Changes are compatible with esophageal rupture in the appropriate clinical setting. Tracheal injury or barotrauma could also have this appearance. Cardiac enlargement. These results were called by telephone at the time of interpretation on 07/23/2017 at 11:36 pm to the patient's nurse, [place], who verbally acknowledged these results. Electronically Signed   By: Lucienne Capers M.D.   On: 07/23/2017 23:46   Dg Chest 1 View  Result Date: 07/23/2017 CLINICAL DATA:  Evaluate for esophageal rupture. Left nephrectomy today. History of esophageal stricture, hypertension, renal mass, former smoker. Shortness of breath. EXAM: CHEST 1 VIEW, NECK SOFT TISSUES 2 VIEW COMPARISON:  CT chest 03/27/2010.  Chest 03/27/2010. FINDINGS: There is extensive subcutaneous emphysema throughout the neck and chest. Mediastinal emphysema in  the upper mediastinum. Etiology would be nonspecific but compatible with esophageal rupture in the appropriate clinical setting. In the recent postoperative patient, tracheal injury or barotrauma could also have this appearance. No pneumothorax is identified although the overlying subcutaneous emphysema could obscure a small pneumothorax. Shallow inspiration. Cardiac enlargement. Pulmonary vascularity appears normal. Calcified granulomas in the right hilum. No focal consolidation. Calcification of the aorta. Postoperative changes in both shoulders. DEGENERATIVE CHANGES IN THE CERVICAL SPINE. IMPRESSION: Extensive subcutaneous emphysema throughout the neck and chest with upper pneumomediastinum. Changes are compatible with esophageal rupture in the appropriate clinical setting. Tracheal injury or barotrauma could also have this appearance. Cardiac enlargement. These results were called by telephone at the time of interpretation on 07/23/2017 at 11:36 pm to the patient's nurse, [place], who verbally acknowledged these results. Electronically Signed   By: Lucienne Capers M.D.   On: 07/23/2017 23:46   Ct Chest W Contrast  Result Date: 07/24/2017 CLINICAL DATA:  Recent nephrectomy. Extensive subcutaneous emphysema in the neck and chest. EXAM: CT CHEST WITH CONTRAST TECHNIQUE: Multidetector CT imaging of the chest was performed during intravenous contrast administration. CONTRAST:  35mL ISOVUE-300  IOPAMIDOL (ISOVUE-300) INJECTION 61% COMPARISON:  Chest radiograph 07/23/2017, CT chest 03/27/2010 FINDINGS: Cardiovascular: Normal heart size. No pericardial effusion. Coronary artery and aortic calcifications. Normal caliber aorta. Calcific and noncalcific plaque formation in the origin of the right brachiocephalic artery causes some narrowing although the vessel remains patent. Mediastinum/Nodes: Diffuse pneumomediastinum and pneumopericardium. Esophagus is fluid-filled and dilated down to the level of the subcarinal  region. The distal esophagus is decompressed. This may represent an esophageal stricture. No significant lymphadenopathy in the mediastinum. Calcified lymph nodes are present in the right hilum. Lungs/Pleura: No pneumothorax. Lungs are clear and expanded except for atelectasis in the bases. Minimal left pleural effusion. Airways are patent. Upper Abdomen: Free intraperitoneal air is demonstrated in the upper abdomen, likely dissecting down from the chest and mediastinum. Low-attenuation lesion in the right lobe of the liver probably represents a hemangioma. Gallbladder is distended without stone or wall thickening. Stomach is decompressed. Calcified granulomas in the spleen. Small amount of free fluid in the upper abdomen around the spleen. Musculoskeletal: Degenerative changes throughout the spine. No destructive bone lesions. Postoperative changes with bilateral shoulder replacements. IMPRESSION: 1. Prominent diffuse subcutaneous emphysema, pneumomediastinum, pneumopericardium, and pneumoperitoneum. Etiology could be due to barotrauma or esophageal perforation. Lack of mediastinal fluid would make esophageal perforation less likely. 2. Proximal esophagus is dilated with decompression of the distal esophagus from the level of the carina. This may indicate esophageal stricture. 3. Small left pleural effusion and small amount of free fluid in the upper abdomen around the spleen. Electronically Signed   By: Lucienne Capers M.D.   On: 07/24/2017 03:28   Dg Esophagus W/water Sol Cm  Result Date: 07/24/2017 CLINICAL DATA:  Subcutaneous emphysema of the neck and chest and pneumomediastinum following robotic assisted laparoscopic left nephrectomy yesterday. EXAM: ESOPHOGRAM/BARIUM SWALLOW TECHNIQUE: Single contrast examination was performed using water-soluble contrast. FLUOROSCOPY TIME:  Fluoroscopy Time:  1 minutes and 54 seconds. Radiation Exposure Index (if provided by the fluoroscopic device): 62.1 mGy  COMPARISON:  CT of the chest on 07/24/2017 FINDINGS: The examination demonstrates no evidence of esophageal rupture or leak. There is mild dilatation of the mid esophagus that tapers distally to the level of a distal esophageal stricture which appears smoothly tapered. Stricture is moderate and not severe with visible transit of liquid in the esophagus and into the stomach. No evidence of associated hiatal hernia or visible gastroesophageal reflux during the procedure. 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 IMPRESSION: No evidence of esophageal rupture or leak. Smoothly tapered moderate distal esophageal stricture which is not restrictive to transit of liquid. 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 Electronically Signed   By: Aletta Edouard M.D.   On: 07/24/2017 11:02    Assessment:  Left renal mass Subcutaneous emphysema-- likely secondary to displaced trocar and insufflation during robotic surgery Respiratory distress--resolved  Procedure(s): XI ROBOTIC ASSISTED LAPAROSCOPIC NEPHRECTOMY, 2 Days Post-Op  doing well.  Plan: Advance to general diet Discontinue IV fluids Ambulate, PT, incentive spirometry Heparin DVT prophylaxis, SCDs P.o. pain medication with IV breakthrough Appreciate the assistance from internal medicine, medical care, and gastroenterology Observe until tomorrow and as long as he does well with general diet he can likely be discharged tomorrow.   Link Snuffer, MD Urology 07/25/2017, 9:17 AM

## 2017-07-25 NOTE — Plan of Care (Signed)
  Progressing Education: Knowledge of General Education information will improve 07/25/2017 0420 - Progressing by Orma Render, RN Health Behavior/Discharge Planning: Ability to manage health-related needs will improve 07/25/2017 0420 - Progressing by Orma Render, RN Clinical Measurements: Ability to maintain clinical measurements within normal limits will improve 07/25/2017 0420 - Progressing by Orma Render, RN Will remain free from infection 07/25/2017 0420 - Progressing by Orma Render, RN Diagnostic test results will improve 07/25/2017 0420 - Progressing by Orma Render, RN Respiratory complications will improve 07/25/2017 0420 - Progressing by Orma Render, RN Cardiovascular complication will be avoided 07/25/2017 0420 - Progressing by Orma Render, RN Activity: Risk for activity intolerance will decrease 07/25/2017 0420 - Progressing by Orma Render, RN Nutrition: Adequate nutrition will be maintained 07/25/2017 0420 - Progressing by Orma Render, RN Coping: Level of anxiety will decrease 07/25/2017 0420 - Progressing by Orma Render, RN Elimination: Will not experience complications related to urinary retention 07/25/2017 0420 - Progressing by Orma Render, RN Pain Managment: General experience of comfort will improve 07/25/2017 0420 - Progressing by Orma Render, RN Safety: Ability to remain free from injury will improve 07/25/2017 0420 - Progressing by Orma Render, RN Skin Integrity: Risk for impaired skin integrity will decrease 07/25/2017 0420 - Progressing by Orma Render, RN

## 2017-07-25 NOTE — Progress Notes (Signed)
Patient continues to improve.  No breathing difficulties, less swelling.    On exam, there is still some mild subcutaneous emphysema or crepitance on palpation of the upper torso.  IMPRESSION:  Improving.  Most likely, the subcutaneous emphysema was related to his robotic surgery and not to any esophageal problem.  Coincidently, he does have radiographic evidence of mild distal esophageal stricturing and long-standing mild dysphagia symptoms.  PLAN:  1.  Concerning the subcutaneous emphysema, no further GI follow-up needed.  I will sign off, but please call if we can be of further assistance during this patient's hospitalization.  2.  Concerning the patient's long-standing dysphagia symptoms and mild esophageal stricture, I will arrange outpatient follow-up with me in a few weeks to go over this in more detail.  I have given the patient my card and instructed him to contact my office to arrange an appointment, if he has not heard from [place] in the next few days.  Preliminarily, however, I explained to the patient and his daughter at the bedside that it does not sound like his current symptoms, or radiographic findings, are of sufficient magnitude to mandate endoscopic dilatation.   Cleotis Nipper, M.D. Pager [phone] If no answer or after 5 PM call [phone]

## 2017-07-25 NOTE — Progress Notes (Signed)
Name: Barry Curry MRN: [id number] DOB: 15-Dec-1940    ADMISSION DATE:  07/23/2017 CONSULTATION DATE:  07/23/2017  REFERRING MD :  Dr. Tresa Moore  CHIEF COMPLAINT:  Subcutaneous Emphysema   HISTORY OF PRESENT ILLNESS:   77 year old male former smoker with PMH of Anemia, Anxiety, Aortic/Mitral Stenosis, Melanoma, HTN, Cardiomyopathy, CAD, Depression, Esophageal Stricture, A.Fib (S/P cardioversion 2018), HLD, HTN, Left upper cystic mass found (12/2016)  Underwent  1/25 for scheduled left robotic radical nephrectomy in setting of 7 cm left upper cystic enhancing mass.Developed extensive subcutaneous emphysema throughout neck and chest with upper pneumomediastinum. PCCM asked to consult  .   SIGNIFICANT EVENTS  1/25 >  radical nephrectomy   STUDIES:  CT chest 1/26 >>Prominent diffuse subcutaneous emphysema, pneumomediastinum, pneumopericardium, and pneumoperitoneum. Proximal esophagus is dilated with decompression of the distal esophagus from the level of the carina. This may indicate esophageal stricture.   SUBJECTIVE: Improving  no resp distress Face & neck swelling down per sister Voice nasal twang persists  but improved Able to ambulate  VITAL SIGNS: Temp:  [98 F (36.7 C)-98.7 F (37.1 C)] 98.7 F (37.1 C) (01/27 0800) Pulse Rate:  [62-89] 89 (01/27 0005) Resp:  [16-23] 23 (01/27 0005) BP: (128-164)/(63-113) 145/73 (01/27 0454) SpO2:  [85 %-99 %] 89 % (01/27 0005)  PHYSICAL EXAMINATION: General:  Elderly male lying in ICU bed in NAD, O2 via [place] in place  Neuro: Awake/alert, interactive, non focal Voice has nasal twang HEENT:  mallampati 2, normal posterior oropharynx. No swelling of uvula or tongue. Tonsils are surgically absent. Cardiovascular: RRR no m/r/g Lungs: CTA , decreased  BL chest wall crepitus extending into lower neck Abdomen: Soft nondistended,Mild tenderness over the larger incision but overall non-tender exam Musculoskeletal: no LE edema  Skin: no  rashes   Recent Labs  Lab 07/23/17 2355 07/24/17 0303  NA 137 135  K 4.9 4.7  CL 106 103  CO2 26 25  BUN 17 17  CREATININE 1.27* 1.33*  GLUCOSE 137* 123*   Recent Labs  Lab 07/19/17 1137 07/23/17 1436 07/23/17 2355 07/24/17 0303  HGB 11.0* 9.9* 10.1* 10.2*  HCT 34.6* 31.2* 31.7* 31.7*  WBC 7.4  --  8.6  --   PLT 185  --  167  --    Dg Neck Soft Tissue  Result Date: 07/23/2017 CLINICAL DATA:  Evaluate for esophageal rupture. Left nephrectomy today. History of esophageal stricture, hypertension, renal mass, former smoker. Shortness of breath. EXAM: CHEST 1 VIEW, NECK SOFT TISSUES 2 VIEW COMPARISON:  CT chest 03/27/2010.  Chest 03/27/2010. FINDINGS: There is extensive subcutaneous emphysema throughout the neck and chest. Mediastinal emphysema in the upper mediastinum. Etiology would be nonspecific but compatible with esophageal rupture in the appropriate clinical setting. In the recent postoperative patient, tracheal injury or barotrauma could also have this appearance. No pneumothorax is identified although the overlying subcutaneous emphysema could obscure a small pneumothorax. Shallow inspiration. Cardiac enlargement. Pulmonary vascularity appears normal. Calcified granulomas in the right hilum. No focal consolidation. Calcification of the aorta. Postoperative changes in both shoulders. DEGENERATIVE CHANGES IN THE CERVICAL SPINE. IMPRESSION: Extensive subcutaneous emphysema throughout the neck and chest with upper pneumomediastinum. Changes are compatible with esophageal rupture in the appropriate clinical setting. Tracheal injury or barotrauma could also have this appearance. Cardiac enlargement. These results were called by telephone at the time of interpretation on 07/23/2017 at 11:36 pm to the patient's nurse, [place], who verbally acknowledged these results. Electronically Signed   By: Lucienne Capers  M.D.   On: 07/23/2017 23:46   Dg Chest 1 View  Result Date: 07/23/2017 CLINICAL  DATA:  Evaluate for esophageal rupture. Left nephrectomy today. History of esophageal stricture, hypertension, renal mass, former smoker. Shortness of breath. EXAM: CHEST 1 VIEW, NECK SOFT TISSUES 2 VIEW COMPARISON:  CT chest 03/27/2010.  Chest 03/27/2010. FINDINGS: There is extensive subcutaneous emphysema throughout the neck and chest. Mediastinal emphysema in the upper mediastinum. Etiology would be nonspecific but compatible with esophageal rupture in the appropriate clinical setting. In the recent postoperative patient, tracheal injury or barotrauma could also have this appearance. No pneumothorax is identified although the overlying subcutaneous emphysema could obscure a small pneumothorax. Shallow inspiration. Cardiac enlargement. Pulmonary vascularity appears normal. Calcified granulomas in the right hilum. No focal consolidation. Calcification of the aorta. Postoperative changes in both shoulders. DEGENERATIVE CHANGES IN THE CERVICAL SPINE. IMPRESSION: Extensive subcutaneous emphysema throughout the neck and chest with upper pneumomediastinum. Changes are compatible with esophageal rupture in the appropriate clinical setting. Tracheal injury or barotrauma could also have this appearance. Cardiac enlargement. These results were called by telephone at the time of interpretation on 07/23/2017 at 11:36 pm to the patient's nurse, [place], who verbally acknowledged these results. Electronically Signed   By: Lucienne Capers M.D.   On: 07/23/2017 23:46   Ct Chest W Contrast  Result Date: 07/24/2017 CLINICAL DATA:  Recent nephrectomy. Extensive subcutaneous emphysema in the neck and chest. EXAM: CT CHEST WITH CONTRAST TECHNIQUE: Multidetector CT imaging of the chest was performed during intravenous contrast administration. CONTRAST:  29mL ISOVUE-300 IOPAMIDOL (ISOVUE-300) INJECTION 61% COMPARISON:  Chest radiograph 07/23/2017, CT chest 03/27/2010 FINDINGS: Cardiovascular: Normal heart size. No pericardial  effusion. Coronary artery and aortic calcifications. Normal caliber aorta. Calcific and noncalcific plaque formation in the origin of the right brachiocephalic artery causes some narrowing although the vessel remains patent. Mediastinum/Nodes: Diffuse pneumomediastinum and pneumopericardium. Esophagus is fluid-filled and dilated down to the level of the subcarinal region. The distal esophagus is decompressed. This may represent an esophageal stricture. No significant lymphadenopathy in the mediastinum. Calcified lymph nodes are present in the right hilum. Lungs/Pleura: No pneumothorax. Lungs are clear and expanded except for atelectasis in the bases. Minimal left pleural effusion. Airways are patent. Upper Abdomen: Free intraperitoneal air is demonstrated in the upper abdomen, likely dissecting down from the chest and mediastinum. Low-attenuation lesion in the right lobe of the liver probably represents a hemangioma. Gallbladder is distended without stone or wall thickening. Stomach is decompressed. Calcified granulomas in the spleen. Small amount of free fluid in the upper abdomen around the spleen. Musculoskeletal: Degenerative changes throughout the spine. No destructive bone lesions. Postoperative changes with bilateral shoulder replacements. IMPRESSION: 1. Prominent diffuse subcutaneous emphysema, pneumomediastinum, pneumopericardium, and pneumoperitoneum. Etiology could be due to barotrauma or esophageal perforation. Lack of mediastinal fluid would make esophageal perforation less likely. 2. Proximal esophagus is dilated with decompression of the distal esophagus from the level of the carina. This may indicate esophageal stricture. 3. Small left pleural effusion and small amount of free fluid in the upper abdomen around the spleen. Electronically Signed   By: Lucienne Capers M.D.   On: 07/24/2017 03:28   Dg Esophagus W/water Sol Cm  Result Date: 07/24/2017 CLINICAL DATA:  Subcutaneous emphysema of the  neck and chest and pneumomediastinum following robotic assisted laparoscopic left nephrectomy yesterday. EXAM: ESOPHOGRAM/BARIUM SWALLOW TECHNIQUE: Single contrast examination was performed using water-soluble contrast. FLUOROSCOPY TIME:  Fluoroscopy Time:  1 minutes and 54 seconds. Radiation Exposure Index (if provided by  the fluoroscopic device): 62.1 mGy COMPARISON:  CT of the chest on 07/24/2017 FINDINGS: The examination demonstrates no evidence of esophageal rupture or leak. There is mild dilatation of the mid esophagus that tapers distally to the level of a distal esophageal stricture which appears smoothly tapered. Stricture is moderate and not severe with visible transit of liquid in the esophagus and into the stomach. No evidence of associated hiatal hernia or visible gastroesophageal reflux during the procedure. 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 IMPRESSION: No evidence of esophageal rupture or leak. Smoothly tapered moderate distal esophageal stricture which is not restrictive to transit of liquid. 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 Electronically Signed   By: Aletta Edouard M.D.   On: 07/24/2017 11:02    ASSESSMENT / PLAN:  1. Respiratory distress (resolved); Pneumomediastinum; Subcutaneous emphysema:- Negative esophagram  -Most likely due to air insufflation during procedure -Should resolve with time    DYsphonia - improving , can advance PO & transfer to floor, hopefully dc in 24h Distal esopahgeal  stricture- pre,morbid,per GI  PCCM available as needed  Kara Mead MD. FCCP. [facility] Pager [phone] If no response call [phone]     07/25/2017, 8:55 AM

## 2017-07-26 ENCOUNTER — Encounter (HOSPITAL_COMMUNITY): Payer: Self-pay | Admitting: Urology

## 2017-07-26 MED ORDER — FUROSEMIDE 10 MG/ML IJ SOLN
INTRAMUSCULAR | Status: AC
  Filled 2017-07-26: qty 2

## 2017-07-26 NOTE — Evaluation (Signed)
Physical Therapy Evaluation-1x eval Patient Details Name: Barry Curry MRN: [id number] DOB: 06/21/1941 Today's Date: 07/26/2017   History of Present Illness  77 yo male s/p L nephrectomy 07/23/17. Hx of anxiety, esophageal stricture  Clinical Impression  On eval, pt was Min guard assist for mobility. He walked a total of ~140 feet (100 feet without a device, 40 feet with a walker). He climbed 9 stairs with use of 1 rail. Pt is mildly unsteady but he did not have any overt losses of balance. Discussed using a walker for first few days until he returns to baseline (daughter stated she has a walker available at home). Do not anticipate any follow up PT needs at discharge. 1x eval.     Follow Up Recommendations No PT follow up;Supervision for mobility/OOB    Equipment Recommendations  None recommended by PT    Recommendations for Other Services       Precautions / Restrictions Precautions Precautions: Fall Restrictions Weight Bearing Restrictions: No      Mobility  Bed Mobility Overal bed mobility: Needs Assistance Bed Mobility: Supine to Sit     Supine to sit: Supervision     General bed mobility comments: cues for technique. Encouraged pt to try to perform task unassisted. Increased time.   Transfers Overall transfer level: Needs assistance Equipment used: Rolling walker (2 wheeled) Transfers: Sit to/from Stand Sit to Stand: Supervision            Ambulation/Gait Ambulation/Gait assistance: Min guard Ambulation Distance (Feet): 140 Feet Assistive device: None;Rolling walker (2 wheeled) Gait Pattern/deviations: Narrow base of support;Step-through pattern     General Gait Details: initially walked with RW for ~40 feet prior to negotiating stairs. Then walked another ~100 feet without an assistive device. Pt tolerated distance well. Supevision with RW; Min guard without device. Mildly unsteady but no overt LOB.   Stairs Stairs: Yes Stairs assistance:  Supervision Stair Management: One rail Left;Alternating pattern Number of Stairs: 9 General stair comments: for safety. No difficulty noted.   Wheelchair Mobility    Modified Rankin (Stroke Patients Only)       Balance                                             Pertinent Vitals/Pain Pain Assessment: 0-10 Pain Score: 6  Pain Location: abdomen Pain Descriptors / Indicators: Sore Pain Intervention(s): Monitored during session    Home Living Family/patient expects to be discharged to:: Private residence Living Arrangements: Children Available Help at Discharge: Family Type of Home: House Home Access: Stairs to enter Entrance Stairs-Rails: Left Entrance Stairs-Number of Steps: 1 flight Home Layout: Two level;Bed/bath upstairs Home Equipment: Environmental consultant - 2 wheels      Prior Function Level of Independence: Independent               Hand Dominance        Extremity/Trunk Assessment   Upper Extremity Assessment Upper Extremity Assessment: Overall WFL for tasks assessed    Lower Extremity Assessment Lower Extremity Assessment: Generalized weakness    Cervical / Trunk Assessment Cervical / Trunk Assessment: Normal  Communication   Communication: No difficulties  Cognition Arousal/Alertness: Awake/alert Behavior During Therapy: WFL for tasks assessed/performed Overall Cognitive Status: Within Functional Limits for tasks assessed  General Comments      Exercises     Assessment/Plan    PT Assessment Patent does not need any further PT services  PT Problem List         PT Treatment Interventions      PT Goals (Current goals can be found in the Care Plan section)  Acute Rehab PT Goals Patient Stated Goal: regain PLOF PT Goal Formulation: All assessment and education complete, DC therapy    Frequency     Barriers to discharge        Co-evaluation                AM-PAC PT "6 Clicks" Daily Activity  Outcome Measure Difficulty turning over in bed (including adjusting bedclothes, sheets and blankets)?: A Lot Difficulty moving from lying on back to sitting on the side of the bed? : A Lot Difficulty sitting down on and standing up from a chair with arms (e.g., wheelchair, bedside commode, etc,.)?: A Little Help needed moving to and from a bed to chair (including a wheelchair)?: A Little Help needed walking in hospital room?: A Little Help needed climbing 3-5 steps with a railing? : A Little 6 Click Score: 16    End of Session   Activity Tolerance: Patient tolerated treatment well Patient left: in bed;with call bell/phone within reach;with family/visitor present(sitting EOB)        Time: 6440-3474 PT Time Calculation (min) (ACUTE ONLY): 13 min   Charges:   PT Evaluation $PT Eval Moderate Complexity: 1 Mod     PT G Codes:          Weston Anna, MPT Pager: [phone]

## 2017-07-26 NOTE — Care Management Note (Signed)
Case Management Note  Patient Details  Name: Barry Curry MRN: [id number] Date of Birth: 18-Sep-1940  Subjective/Objective:  77 y/o m admitted w/Acute respiratory failure. From home. PT-no f/u. No CM needs.                  Action/Plan:d/c home.   Expected Discharge Date:  07/26/17               Expected Discharge Plan:  Home/Self Care  In-House Referral:     Discharge planning Services  CM Consult  Post Acute Care Choice:    Choice offered to:     DME Arranged:    DME Agency:     HH Arranged:    HH Agency:     Status of Service:  Completed, signed off  If discussed at [employer] of Stay Meetings, dates discussed:    Additional Comments:  Dessa Phi, RN 07/26/2017, 10:12 AM

## 2017-07-26 NOTE — Anesthesia Postprocedure Evaluation (Signed)
Anesthesia Post Note  Patient: Barry Curry  Procedure(s) Performed: XI ROBOTIC ASSISTED LAPAROSCOPIC NEPHRECTOMY (Left )     Patient location during evaluation: PACU Anesthesia Type: General Level of consciousness: awake and alert Pain management: pain level controlled Vital Signs Assessment: post-procedure vital signs reviewed and stable Respiratory status: spontaneous breathing, nonlabored ventilation, respiratory function stable and patient connected to nasal cannula oxygen Cardiovascular status: blood pressure returned to baseline and stable Postop Assessment: no apparent nausea or vomiting Anesthetic complications: no    Last Vitals:  Vitals:   07/26/17 0440 07/26/17 0828  BP: 130/73 125/69  Pulse: 66   Resp: 18   Temp: 36.8 C   SpO2: 96%     Last Pain:  Vitals:   07/26/17 0440  TempSrc: Oral  PainSc:                  Kimba Lottes S

## 2017-07-26 NOTE — Discharge Summary (Signed)
Physician Discharge Summary  Patient ID: Barry Curry MRN: [id number] DOB/AGE: December 25, 1940 77 y.o.  Admit date: 07/23/2017 Discharge date: 07/26/2017  Admission Diagnoses: Left Renal Mass  Discharge Diagnoses:  Principal Problem:   Acute respiratory failure with hypoxia ([place]) Active Problems:   Essential hypertension   Kidney mass   Subcutaneous emphysema ([facility])   PAF (paroxysmal atrial fibrillation) ([facility])   History of pulmonary embolism   Discharged Condition: good  Hospital Course:   1 - LEFT Renal Mass - s/p LEFT robotic radical nephrectomy on 1/25, the day of admission. Path pending at discharge. Hgb 10.2, Cr 1.3 at discharge.  2 - Anxiety Attack / RO Esophageal Rupture - pt with long h/o severe post-op anxiety as well as esophageal stricture. Had some subjective SOB / difficulty breathing / swallowing POD 0 and transferred to ICU for observation. GI and critical care consults obtained. He had notable sub Q emphysema (normal with airseal technique operative insufflation) and no evidence of esophageal rupture by swallow study.   By POD 3, the day of discharge, he is ambulatory, pain controlled on PO meds, maintaining PO nutrition and felt to be adequate for discharge.    Consults: critical care, GI  Significant Diagnostic Studies: labs: as per above  Treatments: surgery: as per above  Discharge Exam: Blood pressure 130/73, pulse 66, temperature 98.3 F (36.8 C), temperature source Oral, resp. rate 18, height 6' (1.829 m), weight 100.3 kg (221 lb 1.9 oz), SpO2 96 %. General appearance: alert, cooperative, appears stated age and at baseline Eyes: negative Nose: Nares normal. Septum midline. Mucosa normal. No drainage or sinus tenderness. Throat: lips, mucosa, and tongue normal; teeth and gums normal Neck: supple, symmetrical, trachea midline, thyroid not enlarged, symmetric, no tenderness/mass/nodules and nearly completely resolved expected sub Q crepitus / emphysema.   Back: symmetric, no curvature. ROM normal. No CVA tenderness. Resp: non-labored on room air.  Chest wall: no tenderness Cardio: Nl rate GI: soft, non-tender; bowel sounds normal; no masses,  no organomegaly Male genitalia: normal Extremities: extremities normal, atraumatic, no cyanosis or edema Pulses: 2+ and symmetric Lymph nodes: Cervical, supraclavicular, and axillary nodes normal. Neurologic: Grossly normal Incision/Wound: recent left sided port and extraction sites c/d/i. No hernias.   Disposition:   Discharge Instructions    Call MD for:  difficulty breathing, headache or visual disturbances   Complete by:  As directed    Call MD for:  persistant nausea and vomiting   Complete by:  As directed    Call MD for:  redness, tenderness, or signs of infection (pain, swelling, redness, odor or green/yellow discharge around incision site)   Complete by:  As directed    Call MD for:  severe uncontrolled pain   Complete by:  As directed    Call MD for:  temperature >100.4   Complete by:  As directed    Diet general   Complete by:  As directed    Discharge instructions   Complete by:  As directed    Activity:  You are encouraged to ambulate frequently (about every hour during waking hours) to help prevent blood clots from forming in your legs or lungs.  However, you should not engage in any heavy lifting (> 10-15 lbs), strenuous activity, or straining. Diet: You should advance your diet as instructed by your physician.  It will be normal to have some bloating, nausea, and abdominal discomfort intermittently. Prescriptions:  You will be provided a prescription for pain medication to take as needed.  If your pain is not severe enough to require the prescription pain medication, you may take extra strength Tylenol instead which will have less side effects.  You should also take a prescribed stool softener to avoid straining with bowel movements as the prescription pain medication may  constipate you. Incisions: You may remove your dressing bandages 48 hours after surgery if not removed in the hospital.  You will either have some small staples or special tissue glue at each of the incision sites. Once the bandages are removed (if present), the incisions may stay open to air.  You may start showering (but not soaking or bathing in water) the 2nd day after surgery and the incisions simply need to be patted dry after the shower.  No additional care is needed. What to call us about: You should call the office [phone]) if you develop fever > 101 or develop persistent vomiting.   Driving Restrictions   Complete by:  As directed    No driving while on narcotics   Increase activity slowly   Complete by:  As directed    Lifting restrictions   Complete by:  As directed    Nothing greater than 10lbs     Allergies as of 07/26/2017      Reactions   Penicillins Rash   Has patient had a PCN reaction causing immediate rash, facial/tongue/throat swelling, SOB or lightheadedness with hypotension: No Has patient had a PCN reaction causing severe rash involving mucus membranes or skin necrosis: No Has patient had a PCN reaction that required hospitalization: No Has patient had a PCN reaction occurring within the last 10 years: No If all of the above answers are "NO", then may proceed with Cephalosporin use.      Medication List    TAKE these medications   acetaminophen 500 MG tablet Commonly known as:  TYLENOL Take 500 mg by mouth 2 (two) times daily.   amLODipine 10 MG tablet Commonly known as:  NORVASC Take 10 mg by mouth daily.   aspirin EC 81 MG tablet Take 81 mg by mouth daily.   ELIQUIS 5 MG Tabs tablet Generic drug:  apixaban Take 5 mg by mouth 2 (two) times daily.   fluticasone 50 MCG/ACT nasal spray Commonly known as:  FLONASE Place 2 sprays into both nostrils at bedtime.   lisinopril 20 MG tablet Commonly known as:  PRINIVIL,ZESTRIL Take 10 mg by mouth  daily.   loperamide 2 MG capsule Commonly known as:  IMODIUM Take 2 mg by mouth every other day.   nebivolol 10 MG tablet Commonly known as:  BYSTOLIC Take 10 mg by mouth daily.   pantoprazole 40 MG tablet Commonly known as:  PROTONIX Take 40 mg by mouth daily.   tamsulosin 0.4 MG Caps capsule Commonly known as:  FLOMAX Take 0.4 mg by mouth daily after supper.   traMADol 50 MG tablet Commonly known as:  ULTRAM Take 1 tablet (50 mg total) by mouth every 6 (six) hours as needed.      Follow-up Information    Alexis Frock, MD Follow up on 08/09/2017.   Specialty:  Urology Why:  at 9:30 AM for MD visit. Dr. Tresa Moore will call you with pathology results when available.  Contact information: [address] [place] [address] [phone]           Signed: Alexis Frock 07/26/2017, 6:55 AM

## 2017-07-26 NOTE — Op Note (Signed)
NAME:  BEVERLEY, SHERRARD NO.:  MEDICAL RECORD NO.:  [id number]  LOCATION:                                 FACILITY:  PHYSICIAN:  Alexis Frock, MD     DATE OF BIRTH:  1940/08/22  DATE OF PROCEDURE: 07/23/2017                              OPERATIVE REPORT   DIAGNOSIS:  Enlarged left renal mass.  PROCEDURE:  Left robotic radical nephrectomy.  ESTIMATED BLOOD LOSS:  50 mL.  COMPLICATION:  None.  SPECIMEN:  Left kidney with adrenal and periaortic lymph node, en bloc.  ASSISTANT:  Gopal Narang.  FINDINGS: 1. 2 artery, 1 vein, left renovascular anatomy as anticipated. 2. Very large left upper pole renal mass.  Suspect interval     enlargement since last imaging. 3. Concern for possible node positive disease, significant periaortic     lymphadenopathy.  INDICATION:  Mr. Sennett is a very pleasant 77 year old gentleman, who was found to have a cystic and solid enhancing left upper pole renal mass by imaging performed in [place].  He was going to undergo a left radical nephrectomy there, however, surgery was rescheduled previously twice due to cardiac issues.  He and his family lost faith with that institution and was referred to [place] for consideration of management and imaging reviewed.  He is felt to be suitable candidate for left robotic radical nephrectomy.  He does have a cardiac clearance of his urologist in [place].  The request of Anesthesia Team was reviewed and found to be adequate.  Informed written informed consent placed in the medical record.  PROCEDURE IN DETAIL:  The patient being Jamani Bearce confirmed, procedure being left robotic radical nephrectomy was confirmed. Procedure was carried out.  Time-out was performed.  Intravenous antibiotics were administered.  General endotracheal anesthesia introduced.  The patient placed into a left side up full flank position, applying 15 degrees of stable flexion, superior arm elevator,  axillary roll, sequential compression devices, bottom leg bent, top leg straight. Axillary roll was applied as bean bag.  He has further fashioned operatively using 3-inch tape over foam padding across his supraxiphoid chest and his pelvis.  Sterile field was created by prepping and draping the patient's entire left hemi abdomen and flank using chlorhexidine gluconate.  Next, a high-flow, low-pressure pneumoperitoneum was obtained using Veress technique in the left lower quadrant having passed the aspiration and drop test.  An 8-mm robotic camera port was placed in position approximately 1 handbreadth superior lateral to the umbilicus. Laparoscopic exam of the peritoneal cavity revealed no significant adhesions and no visceral injury.  There was a very large mass effect in the retroperitoneum on the left side adjacent to very large left renal mass.  Additional ports were placed as follows left subcostal 8-mm robotic port, left far lateral 8-mm robotic port approximately 4 fingerbreadths superior and medial to the anterior iliac spine, left paramedian inferior robotic port approximately 4 fingerbreadths superior to the pubic ramus, and two 12 mm assistant port sites in the paramedian location, 1 approximately 3 fingerbreadths inferior medial to the camera and another one 3 fingerbreadths superior medial to the camera.  Robot was docked and passed through the electronic  checks.  Initial attention was directed to the retroperitoneum.  Incision was made lateral to the descending colon.  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  Lateral splenic attachments were taken down to allow the spleen to self retract and the anterior surface of Gerota's was carefully mobilized away from the  pancreas and lateral spleen with these structures to rotate medially out of the plane of dissection.  The lower pole of kidney area was identified and placed on gentle lateral traction.  Dissection proceeded medial to this.  The ureter was found coursing over the iliac vessels. It was placed on gentle lateral traction as were the gonadal vessels. Dissection proceeded within the triangle of the ureter, gonadal vessels, and aorta towards the area of the left renal hilum.  This was where 2 artery single vein, renovascular anatomy as anticipated.  The smaller more anterior artery was controlled using vascular clip x2 proximally, 1 distally and the larger posterior artery was controlled using vascular load clip proximally followed by vascular stapler distally.  The vein was controlled using vascular load stapler.  There was significant area of suspect matted lymph nodes in the periaortic location.  All this material was safe to dissect free and was done.  This plane of dissection was directed down the aorta itself toward the area from the inferior hilum to the upper pole of the kidney.  The adrenal gland was completely removed as well with medial attachments taken down using vascular stapler.  Superior attachments were then released followed by lateral attachments and posterior attachments.  The ureter was doubly clipped and ligated.  This completely freed up the left radical nephrectomy specimen.  The specimen was quite large and would not fit completely into the EndoCatch bag.  As such, the lower 3rd was for marking.  Robot was undocked was retrieved by extending the previous assistant port sites in the paramedian location and further developing this extraction site inferiorly for 4 more centimeters.  The left radical nephrectomy was then removed, set aside for permanent pathology. There was a very small disruption of the upper pole area on extraction. Stitch was placed in this location to  note for pathology.  Extraction site was closed at the level of the fascia using figure-of-eight PDS x8. Scarpa's reapproximated with running Vicryl.  All incision sites were infiltrated with dilute lyophilized Marcaine and closed at the level of the skin using subcuticular Monocryl followed by Dermabond, procedure the terminated.  The patient tolerated the procedure well.  There were no immediate periprocedural complications.  The patient was taken to the postanesthesia care unit in stable condition.          ______________________________ Alexis Frock, MD     TM/MEDQ  D:  07/23/2017  T:  07/24/2017  Job:  [id number]

## 2017-08-25 ENCOUNTER — Inpatient Hospital Stay (HOSPITAL_BASED_OUTPATIENT_CLINIC_OR_DEPARTMENT_OTHER): Payer: Medicare Other | Admitting: Hematology & Oncology

## 2017-08-25 ENCOUNTER — Inpatient Hospital Stay: Payer: Medicare Other | Attending: Hematology & Oncology

## 2017-08-25 ENCOUNTER — Other Ambulatory Visit: Payer: Self-pay

## 2017-08-25 VITALS — BP 138/82 | HR 55 | Temp 97.5°F | Resp 20 | Wt 207.5 lb

## 2017-08-25 DIAGNOSIS — Z7901 Long term (current) use of anticoagulants: Secondary | ICD-10-CM | POA: Insufficient documentation

## 2017-08-25 DIAGNOSIS — C642 Malignant neoplasm of left kidney, except renal pelvis: Secondary | ICD-10-CM

## 2017-08-25 DIAGNOSIS — Z87891 Personal history of nicotine dependence: Secondary | ICD-10-CM | POA: Insufficient documentation

## 2017-08-25 LAB — CBC WITH DIFFERENTIAL (CANCER CENTER ONLY)
BASOS ABS: 0 10*3/uL (ref 0.0–0.1)
BASOS PCT: 1 %
EOS PCT: 4 %
Eosinophils Absolute: 0.3 10*3/uL (ref 0.0–0.5)
HEMATOCRIT: 34.8 % — AB (ref 38.7–49.9)
Hemoglobin: 11.2 g/dL — ABNORMAL LOW (ref 13.0–17.1)
Lymphocytes Relative: 15 %
Lymphs Abs: 1 10*3/uL (ref 0.9–3.3)
MCH: 25.7 pg — ABNORMAL LOW (ref 28.0–33.4)
MCHC: 32.2 g/dL (ref 32.0–35.9)
MCV: 80 fL — AB (ref 82.0–98.0)
MONO ABS: 0.8 10*3/uL (ref 0.1–0.9)
Monocytes Relative: 11 %
NEUTROS ABS: 4.5 10*3/uL (ref 1.5–6.5)
Neutrophils Relative %: 69 %
PLATELETS: 144 10*3/uL — AB (ref 145–400)
RBC: 4.35 MIL/uL (ref 4.20–5.70)
RDW: 15.7 % (ref 11.1–15.7)
WBC Count: 6.6 10*3/uL (ref 4.0–10.0)

## 2017-08-25 LAB — CMP (CANCER CENTER ONLY)
ALBUMIN: 3.6 g/dL (ref 3.5–5.0)
ALT: 15 U/L (ref 10–47)
ANION GAP: 7 (ref 5–15)
AST: 23 U/L (ref 11–38)
Alkaline Phosphatase: 77 U/L (ref 26–84)
BILIRUBIN TOTAL: 0.6 mg/dL (ref 0.2–1.6)
BUN: 17 mg/dL (ref 7–22)
CHLORIDE: 106 mmol/L (ref 98–108)
CO2: 29 mmol/L (ref 18–33)
Calcium: 8.5 mg/dL (ref 8.0–10.3)
Creatinine: 1.5 mg/dL — ABNORMAL HIGH (ref 0.60–1.20)
Glucose, Bld: 100 mg/dL (ref 73–118)
Potassium: 3.6 mmol/L (ref 3.3–4.7)
Sodium: 142 mmol/L (ref 128–145)
Total Protein: 6.6 g/dL (ref 6.4–8.1)

## 2017-08-25 NOTE — Progress Notes (Signed)
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 New Patient (Initial Visit)  : I just had my left kidney taken out.  HPI: Barry Curry is a very nice 77 year old white male.  Barry Curry is very fascinating to talk to.  Barry Curry went to [facility].  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.  Barry Curry had a Copywriter, advertising.  Barry Curry was living up at [facility].  Barry Curry has had multiple surgeries.  Most of these are because of arthritis in his joints.  Barry Curry began to have some renal issues.  I think Barry Curry was having some urinary tract problems.  Barry Curry finally had a CT scan done.  I am not sure where this was done.  However, a mass is noted in the left kidney.  Barry Curry was then referred to Dr. Alexis Frock.  Dr. Tresa Moore did a laparoscopic left radical nephrectomy on July 23, 2017.  The pathology report ([id number]) showed a papillary renal cell carcinoma.  This was 8.5 cm.  It was Furhman grade 3.  All margins were negative.  One lymph node contained cancer.  There was no renal vein involvement.  There was some focal sarcomatoid features.  There is no adrenal gland involvement.  The stage was stage III (T3aN1 Mx).  Barry Curry got through his surgery fairly well.  Barry Curry did have some subcutaneous emphysema because of CO2 that was put into his abdominal cavity for surgery.  Barry Curry was kindly referred to the [place] center for an evaluation.  Barry Curry seems to be in pretty good shape.  Barry Curry has had not had any nausea or vomiting.  Barry Curry has had no bleeding.  Barry Curry is going to the bathroom okay.  Barry Curry has had no leg swelling.  Barry Curry has had no fever.  Barry Curry has had no change in mental state.  Barry Curry comes in with his daughter.  She is very nice.  She was very helpful in "feeling in some of the blanks."  Barry Curry apparently had a episode of atrial fibrillation.  Barry Curry is on Eliquis.  Barry Curry was cardioverted.  I would have to Curry that his overall  performance status is ECOG space 1.   Past Medical History:  Diagnosis Date  . Anemia    history of  . Anxiety   . Aortic stenosis   . Arthritis   . Cancer ([place])    Melanoma back of neck  . Cancer of kidney ([place])   . Cardiomyopathy ([place])   . Complication of anesthesia    violent wake up from anesthesia  . Coronary artery disease   . Depression   . Dyspnea   . Esophageal stricture   . Heart murmur   . History of atrial fibrillation   . History of claustrophobia   . Hyperlipemia   . Hypertension   . Left kidney mass   . Lower extremity edema   . Mitral stenosis   . PVC (premature ventricular contraction)   . RBBB (right bundle branch block with left anterior fascicular block)   :  Past Surgical History:  Procedure Laterality Date  . CARDIOVERSION    . COLONOSCOPY    . ESOPHAGEAL DILATION    . KNEE ARTHROSCOPY Left   . KNEE ARTHROSCOPY Right   . ROBOT ASSISTED LAPAROSCOPIC NEPHRECTOMY Left 07/23/2017   Procedure: XI ROBOTIC ASSISTED LAPAROSCOPIC NEPHRECTOMY;  Surgeon: Alexis Frock, MD;  Location: [facility];  Service: Urology;  Laterality: Left;  . SHOULDER ARTHROSCOPY N/A   . TOTAL HIP ARTHROPLASTY Left   . TOTAL HIP ARTHROPLASTY Right   . TOTAL KNEE ARTHROPLASTY Left   . TOTAL KNEE ARTHROPLASTY Right   . TOTAL SHOULDER REPLACEMENT Left   . TOTAL SHOULDER REPLACEMENT Right   . UPPER GI ENDOSCOPY    :   Current Outpatient Medications:  .  acetaminophen (TYLENOL) 500 MG tablet, Take 500 mg by mouth 2 (two) times daily., Disp: , Rfl:  .  apixaban (ELIQUIS) 5 MG TABS tablet, Take 5 mg by mouth 2 (two) times daily., Disp: , Rfl:  .  aspirin EC 81 MG tablet, Take 81 mg by mouth daily., Disp: , Rfl:  .  fluticasone (FLONASE) 50 MCG/ACT nasal spray, Place 2 sprays into both nostrils at bedtime., Disp: , Rfl:  .  LORazepam (ATIVAN) 0.5 MG tablet, Take 0.5 mg by mouth every 8 (eight) hours as needed for anxiety., Disp: , Rfl:  .  pantoprazole (PROTONIX) 40 MG tablet, Take  40 mg by mouth daily., Disp: , Rfl:  .  tamsulosin (FLOMAX) 0.4 MG CAPS capsule, Take 0.4 mg by mouth daily after supper., Disp: , Rfl:  .  amLODipine (NORVASC) 10 MG tablet, Take 10 mg by mouth daily., Disp: , Rfl:  .  lisinopril (PRINIVIL,ZESTRIL) 20 MG tablet, Take 10 mg by mouth daily., Disp: , Rfl:  .  loperamide (IMODIUM) 2 MG capsule, Take 2 mg by mouth every other day., Disp: , Rfl:  .  nebivolol (BYSTOLIC) 10 MG tablet, Take 10 mg by mouth daily., Disp: , Rfl:  .  traMADol (ULTRAM) 50 MG tablet, Take 1 tablet (50 mg total) by mouth every 6 (six) hours as needed. (Patient not taking: Reported on 08/25/2017), Disp: 10 tablet, Rfl: 0:  :  Allergies  Allergen Reactions  . Penicillins Rash    Has patient had a PCN reaction causing immediate rash, facial/tongue/throat swelling, SOB or lightheadedness with hypotension: No Has patient had a PCN reaction causing severe rash involving mucus membranes or skin necrosis: No Has patient had a PCN reaction that required hospitalization: No Has patient had a PCN reaction occurring within the last 10 years: No If all of the above answers are "NO", then may proceed with Cephalosporin use.   :  No family history on file.:  Social History   Socioeconomic History  . Marital status: Widowed    Spouse name: Not on file  . Number of children: Not on file  . Years of education: Not on file  . Highest education level: Not on file  Social Needs  . Financial resource strain: Not on file  . Food insecurity - worry: Not on file  . Food insecurity - inability: Not on file  . Transportation needs - medical: Not on file  . Transportation needs - non-medical: Not on file  Occupational History  . Not on file  Tobacco Use  . Smoking status: Former Smoker    Packs/day: 0.20    Years: 2.00    Pack years: 0.40    Types: Cigarettes    Last attempt to quit: 07/16/1971    Years since quitting: 46.1  . Smokeless tobacco: Never Used  Substance and Sexual  Activity  . Alcohol use: Yes    Comment: daily Scotch  . Drug use: No  . Sexual activity: Not on file  Other Topics Concern  . Not on file  Social History Narrative  . Not on file  :  Review of Systems  Constitutional: Negative.   HENT: Negative.   Eyes: Negative.   Respiratory: Negative.   Cardiovascular: Negative.   Gastrointestinal: Negative.   Genitourinary: Negative.   Musculoskeletal: Negative.   Skin: Negative.   Neurological: Negative.   Endo/Heme/Allergies: Negative.   Psychiatric/Behavioral: Negative.      Exam: Well-developed and well-nourished white male in no obvious distress.  Vital signs show temperature of 97.5.  Pulse 55.  Blood pressure 138/82.  Weight is 208 pounds.  Head neck exam shows no ocular or oral lesions.  There are no palpable cervical or supraclavicular lymph nodes.  Thyroid is nonpalpable.  Lungs are clear bilaterally.  Cardiac exam regular rate and rhythm with occasional extra beat.  Barry Curry has no murmurs, rubs or bruits.  Abdomen is soft.  Barry Curry has good bowel sounds.  Barry Curry has a laparoscopy scars from his left nephrectomy.  There is no fluid wave.  There is no palpable liver or spleen tip.  Back exam shows no tenderness over the spine, ribs or hips.  Extremities shows no clubbing, cyanosis or edema.  Barry Curry has good range of motion of his joints.  Barry Curry has good pulses in his distal extremities.  Neurological exam shows no focal neurological deficits.  Skin exam shows no rashes, ecchymoses or petechia.    @IPVITALS @   Recent Labs    08/25/17 1434  WBC 6.6  HCT 34.8*  PLT 144*   Recent Labs    08/25/17 1434  NA 142  K 3.6  CL 106  CO2 29  GLUCOSE 100  BUN 17  CREATININE 1.50*  CALCIUM 8.5    Blood smear review: None   Pathology: See above    Assessment and Plan: Barry Curry is a very nice 77 year old white male.  Barry Curry has been in good shape.  Barry Curry had his left kidney taken out.  Barry Curry had stage III renal cell carcinoma.  I spoke to Barry Curry and his  daughter for about an hour.  I reviewed the pathology report with him.  Over 50% of time was spent face-to-face going over the pathology report.  I think that Barry Curry would be a candidate for adjuvant therapy.  I know that the recent randomized clinical trial show that there was a survival benefit for patients with high risk kidney cancer that has been resected.  Sutent was used as the drug.  I would like to make sure that there is no evidence of metastatic disease.  I will set him up with a PET scan.  Barry Curry also needs an MRI of the brain.  Given his age, I probably would have him on Sutent on a 2-week on-1 week off program.  I think Barry Curry would be more tolerant of this.  I gave him information sheet on Sutent.  I answered all their questions.  I will plan to get them back once we get the scans set up.  They are very happy to be getting the scans.  They are both worried about the cancer spreading elsewhere.  I told him that I really do not think that this had happened but scans will certainly help [place].  I probably will get him back to see me in about 3 weeks or so.

## 2017-08-26 LAB — LACTATE DEHYDROGENASE: LDH: 164 U/L (ref 125–245)

## 2017-09-01 ENCOUNTER — Other Ambulatory Visit: Payer: Self-pay | Admitting: Hematology & Oncology

## 2017-09-01 ENCOUNTER — Ambulatory Visit (HOSPITAL_COMMUNITY)
Admission: RE | Admit: 2017-09-01 | Discharge: 2017-09-01 | Disposition: A | Discharge status: Home / Self Care | Payer: Medicare Other | Source: Ambulatory Visit | Attending: Hematology & Oncology | Admitting: Hematology & Oncology

## 2017-09-01 ENCOUNTER — Inpatient Hospital Stay (HOSPITAL_COMMUNITY): Admission: RE | Admit: 2017-09-01 | Payer: Medicare Other | Source: Ambulatory Visit

## 2017-09-01 ENCOUNTER — Ambulatory Visit: Payer: Medicare Other | Admitting: Oncology

## 2017-09-01 ENCOUNTER — Other Ambulatory Visit: Payer: Self-pay | Admitting: *Deleted

## 2017-09-01 DIAGNOSIS — I719 Aortic aneurysm of unspecified site, without rupture: Secondary | ICD-10-CM | POA: Diagnosis not present

## 2017-09-01 DIAGNOSIS — R918 Other nonspecific abnormal finding of lung field: Secondary | ICD-10-CM | POA: Insufficient documentation

## 2017-09-01 DIAGNOSIS — C642 Malignant neoplasm of left kidney, except renal pelvis: Secondary | ICD-10-CM

## 2017-09-01 DIAGNOSIS — I2510 Atherosclerotic heart disease of native coronary artery without angina pectoris: Secondary | ICD-10-CM | POA: Diagnosis not present

## 2017-09-01 DIAGNOSIS — Z9889 Other specified postprocedural states: Secondary | ICD-10-CM | POA: Diagnosis not present

## 2017-09-01 DIAGNOSIS — Z85528 Personal history of other malignant neoplasm of kidney: Secondary | ICD-10-CM | POA: Insufficient documentation

## 2017-09-01 DIAGNOSIS — Z905 Acquired absence of kidney: Secondary | ICD-10-CM | POA: Diagnosis not present

## 2017-09-01 DIAGNOSIS — I639 Cerebral infarction, unspecified: Secondary | ICD-10-CM | POA: Insufficient documentation

## 2017-09-01 DIAGNOSIS — I6782 Cerebral ischemia: Secondary | ICD-10-CM | POA: Diagnosis not present

## 2017-09-01 LAB — GLUCOSE, CAPILLARY: Glucose-Capillary: 134 mg/dL — ABNORMAL HIGH (ref 65–99)

## 2017-09-01 MED ORDER — GADOBENATE DIMEGLUMINE 529 MG/ML IV SOLN: 20.0000 mL | Freq: Once | INTRAVENOUS | Status: DC | PRN

## 2017-09-01 MED ORDER — DIAZEPAM 5 MG PO TABS: ORAL_TABLET | ORAL | 0 refills | Status: DC

## 2017-09-01 MED ORDER — FLUDEOXYGLUCOSE F - 18 (FDG) INJECTION
10.1300 | Freq: Once | INTRAVENOUS | Status: AC | PRN
  Administered 2017-09-01: 10.13 via INTRAVENOUS

## 2017-09-02 ENCOUNTER — Telehealth: Payer: Self-pay | Admitting: *Deleted

## 2017-09-02 NOTE — Telephone Encounter (Addendum)
Patient's daughter is aware of results. She also asked about PET scan which Dr Hayden Rasmussen to release.  ----- Message from Volanda Napoleon, MD sent at 09/01/2017  2:01 PM EST ----- Call - NO obvious brain mets from kidney cancer.  Laurey Arrow

## 2017-09-21 ENCOUNTER — Other Ambulatory Visit: Payer: Self-pay | Admitting: *Deleted

## 2017-09-21 DIAGNOSIS — I10 Essential (primary) hypertension: Secondary | ICD-10-CM

## 2017-09-21 DIAGNOSIS — Z86711 Personal history of pulmonary embolism: Secondary | ICD-10-CM

## 2017-09-22 ENCOUNTER — Inpatient Hospital Stay: Payer: Medicare Other | Attending: Hematology & Oncology

## 2017-09-22 ENCOUNTER — Inpatient Hospital Stay (HOSPITAL_BASED_OUTPATIENT_CLINIC_OR_DEPARTMENT_OTHER): Payer: Medicare Other | Admitting: Hematology & Oncology

## 2017-09-22 ENCOUNTER — Other Ambulatory Visit: Payer: Self-pay | Admitting: *Deleted

## 2017-09-22 ENCOUNTER — Other Ambulatory Visit: Payer: Self-pay

## 2017-09-22 VITALS — BP 130/63 | HR 61 | Temp 97.4°F | Resp 22 | Wt 208.5 lb

## 2017-09-22 DIAGNOSIS — Z79899 Other long term (current) drug therapy: Secondary | ICD-10-CM | POA: Diagnosis not present

## 2017-09-22 DIAGNOSIS — R5383 Other fatigue: Secondary | ICD-10-CM

## 2017-09-22 DIAGNOSIS — I10 Essential (primary) hypertension: Secondary | ICD-10-CM

## 2017-09-22 DIAGNOSIS — N2889 Other specified disorders of kidney and ureter: Secondary | ICD-10-CM

## 2017-09-22 DIAGNOSIS — C642 Malignant neoplasm of left kidney, except renal pelvis: Secondary | ICD-10-CM

## 2017-09-22 DIAGNOSIS — Z86711 Personal history of pulmonary embolism: Secondary | ICD-10-CM

## 2017-09-22 LAB — CBC WITH DIFFERENTIAL (CANCER CENTER ONLY)
BASOS PCT: 1 %
Basophils Absolute: 0.1 10*3/uL (ref 0.0–0.1)
EOS ABS: 0.2 10*3/uL (ref 0.0–0.5)
EOS PCT: 3 %
HCT: 35 % — ABNORMAL LOW (ref 38.7–49.9)
Hemoglobin: 11.1 g/dL — ABNORMAL LOW (ref 13.0–17.1)
Lymphocytes Relative: 11 %
Lymphs Abs: 0.8 10*3/uL — ABNORMAL LOW (ref 0.9–3.3)
MCH: 25.5 pg — ABNORMAL LOW (ref 28.0–33.4)
MCHC: 31.7 g/dL — ABNORMAL LOW (ref 32.0–35.9)
MCV: 80.5 fL — ABNORMAL LOW (ref 82.0–98.0)
Monocytes Absolute: 0.8 10*3/uL (ref 0.1–0.9)
Monocytes Relative: 10 %
Neutro Abs: 6 10*3/uL (ref 1.5–6.5)
Neutrophils Relative %: 75 %
PLATELETS: 150 10*3/uL (ref 145–400)
RBC: 4.35 MIL/uL (ref 4.20–5.70)
RDW: 17.1 % — ABNORMAL HIGH (ref 11.1–15.7)
WBC: 7.9 10*3/uL (ref 4.0–10.0)

## 2017-09-22 LAB — CMP (CANCER CENTER ONLY)
ALBUMIN: 3.7 g/dL (ref 3.5–5.0)
ALK PHOS: 72 U/L (ref 26–84)
ALT: 17 U/L (ref 10–47)
AST: 22 U/L (ref 11–38)
Anion gap: 5 (ref 5–15)
BILIRUBIN TOTAL: 0.7 mg/dL (ref 0.2–1.6)
BUN: 19 mg/dL (ref 7–22)
CALCIUM: 9.2 mg/dL (ref 8.0–10.3)
CO2: 30 mmol/L (ref 18–33)
CREATININE: 1.5 mg/dL — AB (ref 0.60–1.20)
Chloride: 106 mmol/L (ref 98–108)
Glucose, Bld: 101 mg/dL (ref 73–118)
Potassium: 4.3 mmol/L (ref 3.3–4.7)
Sodium: 141 mmol/L (ref 128–145)
TOTAL PROTEIN: 6.5 g/dL (ref 6.4–8.1)

## 2017-09-22 MED ORDER — SUNITINIB MALATE 50 MG PO CAPS: ORAL_CAPSULE | ORAL | 12 refills | Status: DC

## 2017-09-22 NOTE — Progress Notes (Signed)
Hematology and Oncology Follow Up Visit  Barry Curry [id number] 1940/08/18 77 y.o. 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 daily (2 weeks on/one week off) to start on 10/14/2017     Interim History:  Barry Curry is back for his second office visit.  We first saw him back in February.  At that time, he had his left kidney removed.  He had stage III disease.  I felt that we had to work him up for metastatic disease.  He had a PET scan which was negative.  An MRI of the brain which also was negative for metastatic disease.  As such, I feel that he is a good candidate for adjuvant therapy with Sutent based on the recent clinical trial.  He did have a cardiac issue.  He was up in [place].  He had tachycardia.  He went to the emergency room.  He had a complete workup including a cardiac cath which reports as being normal.  He still has some fatigue.  He wants to try some physical therapy.  It sounds like he will get into physical therapy pretty soon.  He has had no problems with bowels or bladder.  He has had no leg swelling.  He has had no rashes.  His appetite has been doing pretty good.  There is no cough.  Overall, his performance status is ECOG 1-2.  Medications:  Current Outpatient Medications:  .  acetaminophen (TYLENOL) 500 MG tablet, Take 500 mg by mouth 2 (two) times daily., Disp: , Rfl:  .  apixaban (ELIQUIS) 5 MG TABS tablet, Take 5 mg by mouth 2 (two) times daily., Disp: , Rfl:  .  aspirin EC 81 MG tablet, Take 81 mg by mouth daily., Disp: , Rfl:  .  diazepam (VALIUM) 5 MG tablet, Give one hour prior to exam., Disp: 1 tablet, Rfl: 0 .  fluticasone (FLONASE) 50 MCG/ACT nasal spray, Place 2 sprays into both nostrils at bedtime., Disp: , Rfl:  .  lisinopril (PRINIVIL,ZESTRIL) 2.5 MG tablet, Take 10 mg by mouth  daily., Disp: , Rfl:  .  loperamide (IMODIUM) 2 MG capsule, Take 2 mg by mouth as needed. , Disp: , Rfl:  .  LORazepam (ATIVAN) 0.5 MG tablet, Take 0.5 mg by mouth every 8 (eight) hours as needed for anxiety., Disp: , Rfl:  .  nebivolol (BYSTOLIC) 10 MG tablet, Take 10 mg by mouth 2 (two) times daily. , Disp: , Rfl:  .  pantoprazole (PROTONIX) 40 MG tablet, Take 40 mg by mouth daily., Disp: , Rfl:  .  tamsulosin (FLOMAX) 0.4 MG CAPS capsule, Take 0.4 mg by mouth daily after supper., Disp: , Rfl:  .  vitamin B-6 (PYRIDOXINE) 25 MG tablet, Take 25 mg by mouth 2 (two) times daily., Disp: , Rfl:  .  SUNItinib (SUTENT) 50 MG capsule, Take 1 capsule daily for 2 weeks on then 1 week off, Disp: 14 capsule, Rfl: 12  Allergies:  Allergies  Allergen Reactions  . Penicillins Rash    Has patient had a PCN reaction causing immediate rash, facial/tongue/throat swelling, SOB or lightheadedness with hypotension: No Has patient had a PCN reaction causing severe rash involving mucus membranes or skin necrosis: No Has patient had a PCN reaction that required hospitalization: No Has patient had a PCN reaction occurring within the last 10 years:  No If all of the above answers are "NO", then may proceed with Cephalosporin use.     Past Medical History, Surgical history, Social history, and Family History were reviewed and updated.  Review of Systems: Review of Systems  Constitutional: Positive for fatigue.  HENT:  Negative.   Eyes: Negative.   Respiratory: Negative.   Cardiovascular: Positive for palpitations.  Gastrointestinal: Negative.   Endocrine: Negative.   Genitourinary: Negative.    Musculoskeletal: Negative.   Skin: Negative.   Neurological: Negative.   Hematological: Negative.   Psychiatric/Behavioral: Negative.     Physical Exam:  weight is 208 lb 8 oz (94.6 kg). His oral temperature is 97.4 F (36.3 C) (abnormal). His blood pressure is 130/63 and his pulse is 61. His respiration is 22  (abnormal) and oxygen saturation is 100%.   Wt Readings from Last 3 Encounters:  09/22/17 208 lb 8 oz (94.6 kg)  08/25/17 207 lb 8 oz (94.1 kg)  07/23/17 221 lb 1.9 oz (100.3 kg)    Physical Exam  Constitutional: He is oriented to person, place, and time.  HENT:  Head: Normocephalic and atraumatic.  Mouth/Throat: Oropharynx is clear and moist.  Eyes: Pupils are equal, round, and reactive to light. EOM are normal.  Neck: Normal range of motion.  Cardiovascular: Normal rate, regular rhythm and normal heart sounds.  Pulmonary/Chest: Effort normal and breath sounds normal.  Abdominal: Soft. Bowel sounds are normal.  Musculoskeletal: Normal range of motion. He exhibits no edema, tenderness or deformity.  Lymphadenopathy:    He has no cervical adenopathy.  Neurological: He is alert and oriented to person, place, and time.  Skin: Skin is warm and dry. No rash noted. No erythema.  Psychiatric: He has a normal mood and affect. His behavior is normal. Judgment and thought content normal.  Vitals reviewed.    Lab Results  Component Value Date   WBC 7.9 09/22/2017   HGB 10.2 (L) 07/24/2017   HCT 35.0 (L) 09/22/2017   MCV 80.5 (L) 09/22/2017   PLT 150 09/22/2017     Chemistry      Component Value Date/Time   NA 141 09/22/2017 1411   K 4.3 09/22/2017 1411   CL 106 09/22/2017 1411   CO2 30 09/22/2017 1411   BUN 19 09/22/2017 1411   CREATININE 1.50 (H) 09/22/2017 1411      Component Value Date/Time   CALCIUM 9.2 09/22/2017 1411   ALKPHOS 72 09/22/2017 1411   AST 22 09/22/2017 1411   ALT 17 09/22/2017 1411   BILITOT 0.7 09/22/2017 1411       Impression and Plan: Barry Curry is a 77 year old white male.  He has stage IIIA clear cell carcinoma of the left kidney.  This was resected at the end of January.  I think that we really need to start his adjuvant therapy soon.  However, he lives with his daughter who is going to be very busy with the furniture market that starting  early in [employer].  It sounds like it would be best for him to start treatment in mid April.  We talked about Sutent.  We talked about the side effects.  I think that the 2-week on/one week off protocol would work very well for him.  We will get him started.  Hopefully, this will not be an issue with his insurance.  I will get him back in early May.  I want to see him after he has his first cycle of treatment.  I spent  about 40 minutes with he and his daughter.  Over half the time was spent face-to-face with them talking about his heart issues and talking about Sutent.  I told Barry Curry that if he does have side effects, we would just stop the Sutent.  He is in total agreement with this.  He wants quality of life over quantity of life. Volanda Napoleon, MD 3/27/20194:08 PM

## 2017-09-23 ENCOUNTER — Telehealth: Payer: Self-pay | Admitting: Pharmacist

## 2017-09-23 ENCOUNTER — Telehealth: Payer: Self-pay | Admitting: Hematology & Oncology

## 2017-09-23 LAB — LACTATE DEHYDROGENASE: LDH: 183 U/L (ref 125–245)

## 2017-09-23 NOTE — Telephone Encounter (Signed)
Oral Oncology Pharmacist Encounter  Received new prescription for Sutent (sunitinib) for the adjuvant treatment of renal cell carcinoma, planned duration until disease progression or unacceptable drug toxicity.  CMP/CBC from 09/22/17 assessed, no relevant lab abnormalities. Prescription dose and frequency assessed.   Current medication list in Epic reviewed, no relevant DDIs with sunitnib identified.  Prescription has been e-scribed to the [facility] for benefits analysis and approval.  Oral Oncology Clinic will continue to follow for insurance authorization, copayment issues, initial counseling and start date.  Darl Pikes, PharmD, BCPS Hematology/Oncology Clinical Pharmacist [facility] [place] [facility] [phone]  09/23/2017 9:26 AM

## 2017-09-23 NOTE — Telephone Encounter (Signed)
Oral Oncology Patient Advocate Encounter  Received notification from [facility] that prior authorization for Sutent is required.  PA submitted on CoverMyMeds Key [id number] Status is pending  Oral Oncology Clinic will continue to follow.  [place] Patient Advocate [phone] 09/23/2017 10:56 AM

## 2017-09-24 ENCOUNTER — Telehealth: Payer: Self-pay | Admitting: Hematology & Oncology

## 2017-09-24 MED FILL — SUTENT 50 MG CAPS: 50 | 21 days supply | Qty: 14 | Fill #0

## 2017-09-24 NOTE — Telephone Encounter (Signed)
Oral Oncology Patient Advocate Encounter  Was successful in securing patient a year grant from [place] to provide copayment coverage for his Sutent.  This will keep the out of pocket expense at $0.    I have spoken with the patient.    The billing information is as follows and has been shared with [place].   Member ID: [id number] Group ID: [id number] RxBin: [id number] Dates of Eligibility: 09/24/2017 through 09/25/2018   [place] Patient Advocate [phone] 09/24/2017 2:35 PM

## 2017-09-24 NOTE — Telephone Encounter (Signed)
Oral Oncology Patient Advocate Encounter  Prior Authorization for SUTENT has been approved.    PA# [id number] Effective dates: 09/24/2017 through 03/23/2018  Oral Oncology Clinic will continue to follow.   Co-pay is $1,953.81    [facility] Patient Advocate [phone] 09/24/2017 2:03 PM

## 2017-09-24 NOTE — Telephone Encounter (Signed)
Oral Chemotherapy Pharmacist Encounter  Patient Education I spoke with patient's daughter Ailene Ravel for overview of new oral chemotherapy medication: Sutent (sunitinib) for the adjuvant treatment of renal cell carcinoma, planned duration until disease progression or unacceptable drug toxicity.   Pt is doing well. Counseled patient on administration, dosing, side effects, monitoring, drug-food interactions (grapefriut), safe handling, storage, and disposal. Patient will take Take 1 capsule (50mg ) daily for 2 weeks on then 1 week off. Per Dr. Antonieta Pert instruction, patient is to start his medication on 10/11/17  Side effects include but not limited to: decreased Hgb/ WBC/plt, diarrhea, fatigue, N/V, mouth irritation, increased BP.    Reviewed with patient importance of keeping a medication schedule and plan for any missed doses.  Ailene Ravel voiced understanding and appreciation.  All questions answered. Medication handout and calendar placed in the mail.   Provided patient with Oral Section Clinic phone number. Patient knows to call the office with questions or concerns. Oral Chemotherapy Navigation Clinic will continue to follow.  Darl Pikes, PharmD, BCPS Hematology/Oncology Clinical Pharmacist [facility] [place] [facility] [phone]  09/24/2017 2:52 PM\

## 2017-09-24 NOTE — Telephone Encounter (Signed)
Oral Oncology Patient Advocate Encounter  Sent [facility] an email to please mail patients Sutent. Will need to be ordered.    [facility] Patient Advocate [phone] 09/24/2017 3:02 PM

## 2017-09-28 NOTE — Telephone Encounter (Signed)
Oral Oncology Patient Advocate Encounter  Patients received his Sutent 09/27/2017 from [facility].   [place] Patient Advocate [phone] 09/28/2017 8:04 AM

## 2017-10-15 ENCOUNTER — Telehealth: Payer: Self-pay | Admitting: Pharmacist

## 2017-10-15 NOTE — Telephone Encounter (Signed)
Oral Chemotherapy Pharmacist Encounter   Called patient's Daughter Ailene Ravel to see how her father is doing since starting his Sutent on 10/11/17. She stated that she is doing well just very tired. I asked her if she felt like he needed to be seen sooner than her upcoming appt and she said she was not sure but that she would called if she felt he needed to come in earlier.   Placed medication handout and Sutent medication calendar in the mail.   Darl Pikes, PharmD, BCPS Hematology/Oncology Clinical Pharmacist [facility] [place] [facility] [phone]  10/15/2017 2:51 PM

## 2017-10-18 ENCOUNTER — Telehealth: Payer: Self-pay | Admitting: *Deleted

## 2017-10-18 NOTE — Telephone Encounter (Signed)
Patient called to let us know that he has been very tired and weak since beginning Sutent one week ago.  Not SOB or with pain but it takes very little to make him tired.  Once he rests, he feels much better.  Dr. Marin Olp notified.  States to stop Sutent for 3 days and then restart.  Patient ok with these instructions.  Will call us if no better

## 2017-10-25 ENCOUNTER — Telehealth: Payer: Self-pay | Admitting: *Deleted

## 2017-10-25 NOTE — Telephone Encounter (Signed)
Patient's daughter notifying the office that patient has been admitted to [facility]. He will be getting a pacemaker today. He has no restarted his Sutent at this time. He has a scheduled appointment for 10/27/17 but he needs to reschedule.  Reviewed with Dr Marin Olp. He would like patient to reschedule for about 3 weeks from today. Message sent to scheduler.

## 2017-10-27 ENCOUNTER — Ambulatory Visit: Payer: Medicare Other | Admitting: Hematology & Oncology

## 2017-10-27 ENCOUNTER — Other Ambulatory Visit: Payer: Medicare Other

## 2017-11-17 ENCOUNTER — Inpatient Hospital Stay (HOSPITAL_BASED_OUTPATIENT_CLINIC_OR_DEPARTMENT_OTHER): Payer: Medicare Other | Admitting: Hematology & Oncology

## 2017-11-17 ENCOUNTER — Other Ambulatory Visit: Payer: Self-pay

## 2017-11-17 ENCOUNTER — Encounter: Payer: Self-pay | Admitting: Hematology & Oncology

## 2017-11-17 ENCOUNTER — Inpatient Hospital Stay: Payer: Medicare Other | Attending: Hematology & Oncology

## 2017-11-17 VITALS — BP 137/61 | HR 60 | Temp 98.0°F | Resp 20 | Wt 206.4 lb

## 2017-11-17 DIAGNOSIS — C642 Malignant neoplasm of left kidney, except renal pelvis: Secondary | ICD-10-CM | POA: Insufficient documentation

## 2017-11-17 DIAGNOSIS — Z7189 Other specified counseling: Secondary | ICD-10-CM | POA: Diagnosis not present

## 2017-11-17 DIAGNOSIS — N2889 Other specified disorders of kidney and ureter: Secondary | ICD-10-CM

## 2017-11-17 DIAGNOSIS — Z950 Presence of cardiac pacemaker: Secondary | ICD-10-CM | POA: Insufficient documentation

## 2017-11-17 HISTORY — DX: Other specified counseling: Z71.89

## 2017-11-17 HISTORY — DX: Malignant neoplasm of left kidney, except renal pelvis: C64.2

## 2017-11-17 LAB — CMP (CANCER CENTER ONLY)
ALK PHOS: 73 U/L (ref 26–84)
ALT: 18 U/L (ref 10–47)
AST: 21 U/L (ref 11–38)
Albumin: 3.7 g/dL (ref 3.5–5.0)
Anion gap: 9 (ref 5–15)
BUN: 23 mg/dL — AB (ref 7–22)
CO2: 28 mmol/L (ref 18–33)
Calcium: 9.1 mg/dL (ref 8.0–10.3)
Chloride: 105 mmol/L (ref 98–108)
Creatinine: 1.8 mg/dL — ABNORMAL HIGH (ref 0.60–1.20)
GLUCOSE: 95 mg/dL (ref 73–118)
Potassium: 4.3 mmol/L (ref 3.3–4.7)
SODIUM: 142 mmol/L (ref 128–145)
TOTAL PROTEIN: 6.5 g/dL (ref 6.4–8.1)
Total Bilirubin: 0.7 mg/dL (ref 0.2–1.6)

## 2017-11-17 LAB — CBC WITH DIFFERENTIAL (CANCER CENTER ONLY)
BASOS ABS: 0 10*3/uL (ref 0.0–0.1)
Basophils Relative: 0 %
EOS PCT: 2 %
Eosinophils Absolute: 0.2 10*3/uL (ref 0.0–0.5)
HEMATOCRIT: 34.7 % — AB (ref 38.7–49.9)
Hemoglobin: 11.3 g/dL — ABNORMAL LOW (ref 13.0–17.1)
LYMPHS PCT: 15 %
Lymphs Abs: 0.9 10*3/uL (ref 0.9–3.3)
MCH: 26.9 pg — ABNORMAL LOW (ref 28.0–33.4)
MCHC: 32.6 g/dL (ref 32.0–35.9)
MCV: 82.6 fL (ref 82.0–98.0)
MONO ABS: 0.8 10*3/uL (ref 0.1–0.9)
MONOS PCT: 13 %
Neutro Abs: 4.2 10*3/uL (ref 1.5–6.5)
Neutrophils Relative %: 70 %
PLATELETS: 139 10*3/uL — AB (ref 145–400)
RBC: 4.2 MIL/uL (ref 4.20–5.70)
RDW: 19.7 % — AB (ref 11.1–15.7)
WBC Count: 6.1 10*3/uL (ref 4.0–10.0)

## 2017-11-17 NOTE — Progress Notes (Signed)
Hematology and Oncology Follow Up Visit  Barry Curry [id number] Sep 13, 1940 77 y.o. 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 daily (2 weeks on/one week off) to start on 10/14/2017 --  Patient d/c'ed due to toxicity     Interim History:  Barry Curry is back for follow-up.  Unfortunately, he stopped the Sutent himself.  I am not sure exactly if he was truly having a lot of problems with the Sutent.  He is been having cardiology issues.  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  This has been to feel a little bit better.  He apparently has some element of atrial fibrillation.  He is very reluctant to restart the Sutent.  I talked to him at length about this.  I think that he has a significant risk for recurrent disease.  He has a positive lymph node.  I told him that maybe we can start off at 25 mg daily.  He will think about this.  He will see his cardiologist this Friday.  Apparently he has some issues with orthostatic hypotension.  I suspect this probably is secondary to him not drinking enough fluids.  His BUN and creatinine have been going up slowly.  He has had no bleeding.  He has had no diarrhea.  He has had no leg swelling.  He has had no cough or shortness of breath.  Overall, his performance status is ECOG 1-2.   Medications:  Current Outpatient Medications:  .  acetaminophen (TYLENOL) 500 MG tablet, Take 500 mg by mouth 2 (two) times daily., Disp: , Rfl:  .  apixaban (ELIQUIS) 5 MG TABS tablet, Take 5 mg by mouth 2 (two) times daily., Disp: , Rfl:  .  aspirin EC 81 MG tablet, Take 81 mg by mouth daily., Disp: , Rfl:  .  diclofenac sodium (VOLTAREN) 1 % GEL, Apply 4 g topically., Disp: , Rfl:  .  flecainide (TAMBOCOR) 50 MG tablet, Take by mouth., Disp: , Rfl:  .  fluticasone  (FLONASE) 50 MCG/ACT nasal spray, Place 2 sprays into both nostrils at bedtime., Disp: , Rfl:  .  loperamide (IMODIUM) 2 MG capsule, Take 2 mg by mouth as needed. , Disp: , Rfl:  .  nebivolol (BYSTOLIC) 10 MG tablet, Take 10 mg by mouth 2 (two) times daily. , Disp: , Rfl:  .  pantoprazole (PROTONIX) 40 MG tablet, Take 40 mg by mouth daily., Disp: , Rfl:  .  tamsulosin (FLOMAX) 0.4 MG CAPS capsule, Take 0.4 mg by mouth daily after supper., Disp: , Rfl:  .  vitamin B-6 (PYRIDOXINE) 25 MG tablet, Take 25 mg by mouth 2 (two) times daily., Disp: , Rfl:  .  diazepam (VALIUM) 5 MG tablet, Give one hour prior to exam., Disp: 1 tablet, Rfl: 0 .  lisinopril (PRINIVIL,ZESTRIL) 2.5 MG tablet, Take 10 mg by mouth daily., Disp: , Rfl:  .  LORazepam (ATIVAN) 0.5 MG tablet, Take 0.5 mg by mouth every 8 (eight) hours as needed for anxiety., Disp: , Rfl:  .  SUNItinib (SUTENT) 50 MG capsule, Take 1 capsule daily for 2 weeks on then 1 week off (Patient not taking: Reported on 11/17/2017), Disp: 14 capsule, Rfl: 12  Allergies:  Allergies  Allergen Reactions  . Penicillins Rash    Has patient had a PCN reaction causing  immediate rash, facial/tongue/throat swelling, SOB or lightheadedness with hypotension: No Has patient had a PCN reaction causing severe rash involving mucus membranes or skin necrosis: No Has patient had a PCN reaction that required hospitalization: No Has patient had a PCN reaction occurring within the last 10 years: No If all of the above answers are "NO", then may proceed with Cephalosporin use.     Past Medical History, Surgical history, Social history, and Family History were reviewed and updated.  Review of Systems: Review of Systems  Constitutional: Positive for fatigue.  HENT:  Negative.   Eyes: Negative.   Respiratory: Negative.   Cardiovascular: Positive for palpitations.  Gastrointestinal: Negative.   Endocrine: Negative.   Genitourinary: Negative.    Musculoskeletal:  Negative.   Skin: Negative.   Neurological: Negative.   Hematological: Negative.   Psychiatric/Behavioral: Negative.     Physical Exam:  weight is 206 lb 6.4 oz (93.6 kg). His oral temperature is 98 F (36.7 C). His blood pressure is 137/61 and his pulse is 60. His respiration is 20 and oxygen saturation is 100%.   Wt Readings from Last 3 Encounters:  11/17/17 206 lb 6.4 oz (93.6 kg)  09/22/17 208 lb 8 oz (94.6 kg)  08/25/17 207 lb 8 oz (94.1 kg)    Physical Exam  Constitutional: He is oriented to person, place, and time.  HENT:  Head: Normocephalic and atraumatic.  Mouth/Throat: Oropharynx is clear and moist.  Eyes: Pupils are equal, round, and reactive to light. EOM are normal.  Neck: Normal range of motion.  Cardiovascular: Normal rate, regular rhythm and normal heart sounds.  Pulmonary/Chest: Effort normal and breath sounds normal.  Abdominal: Soft. Bowel sounds are normal.  Musculoskeletal: Normal range of motion. He exhibits no edema, tenderness or deformity.  Lymphadenopathy:    He has no cervical adenopathy.  Neurological: He is alert and oriented to person, place, and time.  Skin: Skin is warm and dry. No rash noted. No erythema.  Psychiatric: He has a normal mood and affect. His behavior is normal. Judgment and thought content normal.  Vitals reviewed.    Lab Results  Component Value Date   WBC 6.1 11/17/2017   HGB 11.3 (L) 11/17/2017   HCT 34.7 (L) 11/17/2017   MCV 82.6 11/17/2017   PLT 139 (L) 11/17/2017     Chemistry      Component Value Date/Time   NA 142 11/17/2017 1458   K 4.3 11/17/2017 1458   CL 105 11/17/2017 1458   CO2 28 11/17/2017 1458   BUN 23 (H) 11/17/2017 1458   CREATININE 1.80 (H) 11/17/2017 1458      Component Value Date/Time   CALCIUM 9.1 11/17/2017 1458   ALKPHOS 73 11/17/2017 1458   AST 21 11/17/2017 1458   ALT 18 11/17/2017 1458   BILITOT 0.7 11/17/2017 1458       Impression and Plan: Barry Curry is a 77 year old white  male.  He has stage IIIA clear cell carcinoma of the left kidney.  This was resected at the end of January.  Is now been 4 months since he had his nephrectomy for the left kidney.  We are getting to the point where adjuvant therapy may not be all that effective.  Hopefully, he will agree to the 25 mg daily dose.  I will still do 2 weeks on and one week off.  He will let us know after he sees a cardiologist if he wants to restart the Sutent.  I will see him  back in about 4 weeks.  Hopefully, he will be doing a little bit better.   Volanda Napoleon, MD 5/22/20194:47 PM

## 2017-11-18 LAB — LACTATE DEHYDROGENASE: LDH: 229 U/L (ref 125–245)

## 2017-11-26 ENCOUNTER — Other Ambulatory Visit: Payer: Self-pay | Admitting: *Deleted

## 2017-11-26 DIAGNOSIS — C642 Malignant neoplasm of left kidney, except renal pelvis: Secondary | ICD-10-CM

## 2017-11-26 MED ORDER — SUNITINIB MALATE 25 MG PO CAPS: ORAL_CAPSULE | ORAL | 11 refills | Status: DC

## 2017-11-29 MED FILL — SUTENT 25 MG CAPSULE: 25 | 21 days supply | Qty: 14 | Fill #0

## 2017-12-15 ENCOUNTER — Inpatient Hospital Stay (HOSPITAL_BASED_OUTPATIENT_CLINIC_OR_DEPARTMENT_OTHER): Payer: Medicare Other | Admitting: Hematology & Oncology

## 2017-12-15 ENCOUNTER — Inpatient Hospital Stay: Payer: Medicare Other | Attending: Hematology & Oncology

## 2017-12-15 VITALS — BP 116/66 | HR 62 | Temp 97.7°F | Resp 18 | Wt 205.0 lb

## 2017-12-15 DIAGNOSIS — C642 Malignant neoplasm of left kidney, except renal pelvis: Secondary | ICD-10-CM | POA: Insufficient documentation

## 2017-12-15 DIAGNOSIS — Z950 Presence of cardiac pacemaker: Secondary | ICD-10-CM | POA: Insufficient documentation

## 2017-12-15 DIAGNOSIS — R002 Palpitations: Secondary | ICD-10-CM

## 2017-12-15 LAB — CBC WITH DIFFERENTIAL (CANCER CENTER ONLY)
BASOS PCT: 1 %
Basophils Absolute: 0.1 10*3/uL (ref 0.0–0.1)
EOS ABS: 0.2 10*3/uL (ref 0.0–0.5)
Eosinophils Relative: 4 %
HCT: 37.4 % — ABNORMAL LOW (ref 38.7–49.9)
HEMOGLOBIN: 12.1 g/dL — AB (ref 13.0–17.1)
LYMPHS ABS: 1.1 10*3/uL (ref 0.9–3.3)
Lymphocytes Relative: 18 %
MCH: 27.2 pg — AB (ref 28.0–33.4)
MCHC: 32.4 g/dL (ref 32.0–35.9)
MCV: 84 fL (ref 82.0–98.0)
MONO ABS: 0.5 10*3/uL (ref 0.1–0.9)
MONOS PCT: 9 %
NEUTROS PCT: 68 %
Neutro Abs: 4.1 10*3/uL (ref 1.5–6.5)
Platelet Count: 166 10*3/uL (ref 145–400)
RBC: 4.45 MIL/uL (ref 4.20–5.70)
RDW: 19.8 % — ABNORMAL HIGH (ref 11.1–15.7)
WBC Count: 5.9 10*3/uL (ref 4.0–10.0)

## 2017-12-15 LAB — CMP (CANCER CENTER ONLY)
ALBUMIN: 3.6 g/dL (ref 3.5–5.0)
ALK PHOS: 82 U/L (ref 26–84)
ALT: 17 U/L (ref 10–47)
AST: 25 U/L (ref 11–38)
Anion gap: 7 (ref 5–15)
BUN: 25 mg/dL — AB (ref 7–22)
CHLORIDE: 105 mmol/L (ref 98–108)
CO2: 29 mmol/L (ref 18–33)
Calcium: 9.1 mg/dL (ref 8.0–10.3)
Creatinine: 1.8 mg/dL — ABNORMAL HIGH (ref 0.60–1.20)
Glucose, Bld: 110 mg/dL (ref 73–118)
POTASSIUM: 4.3 mmol/L (ref 3.3–4.7)
SODIUM: 141 mmol/L (ref 128–145)
Total Bilirubin: 0.6 mg/dL (ref 0.2–1.6)
Total Protein: 6.5 g/dL (ref 6.4–8.1)

## 2017-12-15 NOTE — Progress Notes (Signed)
Hematology and Oncology Follow Up Visit  Barry Curry [id number] Nov 22, 1940 77 y.o. 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 daily (2 weeks on/2 weeks off)  -  S/p cycle             #1     Interim History:  Mr. Barry Curry is back for follow-up.  He did agree to try the 25 mg dose of Sutent.  He will finish his first cycle up this Saturday.  He thinks that he still having trouble with this.  However, I think that he might be having trouble with his pacemaker.  Apparently, he has some problems with blood pressure and heart rate during physical therapy.  His pacemaker does not seem to be kicking in as his heart rate was below 60.  I told he and his daughter that the Sutent should not cause low blood pressure.  Also should not cause bradycardia or low heart rate.  He has had no diarrhea.  He has had no fever.  He has had no leg swelling.  He has had no rashes.  He has had no headache.  Overall, I would say that his performance status is ECOG 2.   Medications:  Current Outpatient Medications:  .  Ferrous Sulfate Dried (FERROUS SULFATE CR PO), Take by mouth daily., Disp: , Rfl:  .  acetaminophen (TYLENOL) 500 MG tablet, Take 500 mg by mouth 2 (two) times daily., Disp: , Rfl:  .  apixaban (ELIQUIS) 5 MG TABS tablet, Take 5 mg by mouth 2 (two) times daily., Disp: , Rfl:  .  aspirin EC 81 MG tablet, Take 81 mg by mouth daily., Disp: , Rfl:  .  diazepam (VALIUM) 5 MG tablet, Give one hour prior to exam., Disp: 1 tablet, Rfl: 0 .  diclofenac sodium (VOLTAREN) 1 % GEL, Apply 4 g topically., Disp: , Rfl:  .  flecainide (TAMBOCOR) 50 MG tablet, Take by mouth., Disp: , Rfl:  .  fluticasone (FLONASE) 50 MCG/ACT nasal spray, Place 2 sprays into both nostrils at bedtime., Disp: , Rfl:  .  lisinopril (PRINIVIL,ZESTRIL) 2.5 MG tablet, Take  10 mg by mouth daily., Disp: , Rfl:  .  loperamide (IMODIUM) 2 MG capsule, Take 2 mg by mouth as needed. , Disp: , Rfl:  .  LORazepam (ATIVAN) 0.5 MG tablet, Take 0.5 mg by mouth every 8 (eight) hours as needed for anxiety., Disp: , Rfl:  .  nebivolol (BYSTOLIC) 10 MG tablet, Take 10 mg by mouth 2 (two) times daily. , Disp: , Rfl:  .  pantoprazole (PROTONIX) 40 MG tablet, Take 40 mg by mouth daily., Disp: , Rfl:  .  SUNItinib (SUTENT) 25 MG capsule, Take 1 capsule daily for 2 weeks on then 1 week off, Disp: 14 capsule, Rfl: 11 .  tamsulosin (FLOMAX) 0.4 MG CAPS capsule, Take 0.4 mg by mouth daily after supper., Disp: , Rfl:  .  vitamin B-6 (PYRIDOXINE) 25 MG tablet, Take 25 mg by mouth 2 (two) times daily., Disp: , Rfl:   Allergies:  Allergies  Allergen Reactions  . Penicillins Rash    Has patient had a PCN reaction causing immediate rash, facial/tongue/throat swelling, SOB or lightheadedness with hypotension: No Has patient had a PCN reaction causing severe rash involving mucus membranes or skin necrosis: No Has patient  had a PCN reaction that required hospitalization: No Has patient had a PCN reaction occurring within the last 10 years: No If all of the above answers are "NO", then may proceed with Cephalosporin use.     Past Medical History, Surgical history, Social history, and Family History were reviewed and updated.  Review of Systems: Review of Systems  Constitutional: Positive for fatigue.  HENT:  Negative.   Eyes: Negative.   Respiratory: Negative.   Cardiovascular: Positive for palpitations.  Gastrointestinal: Negative.   Endocrine: Negative.   Genitourinary: Negative.    Musculoskeletal: Negative.   Skin: Negative.   Neurological: Negative.   Hematological: Negative.   Psychiatric/Behavioral: Negative.     Physical Exam:  weight is 205 lb (93 kg). His oral temperature is 97.7 F (36.5 C). His blood pressure is 116/66 and his pulse is 62. His respiration is 18 and  oxygen saturation is 98%.   Wt Readings from Last 3 Encounters:  12/15/17 205 lb (93 kg)  11/17/17 206 lb 6.4 oz (93.6 kg)  09/22/17 208 lb 8 oz (94.6 kg)    Physical Exam  Constitutional: He is oriented to person, place, and time.  HENT:  Head: Normocephalic and atraumatic.  Mouth/Throat: Oropharynx is clear and moist.  Eyes: Pupils are equal, round, and reactive to light. EOM are normal.  Neck: Normal range of motion.  Cardiovascular: Normal rate, regular rhythm and normal heart sounds.  Pulmonary/Chest: Effort normal and breath sounds normal.  Abdominal: Soft. Bowel sounds are normal.  Musculoskeletal: Normal range of motion. He exhibits no edema, tenderness or deformity.  Lymphadenopathy:    He has no cervical adenopathy.  Neurological: He is alert and oriented to person, place, and time.  Skin: Skin is warm and dry. No rash noted. No erythema.  Psychiatric: He has a normal mood and affect. His behavior is normal. Judgment and thought content normal.  Vitals reviewed.    Lab Results  Component Value Date   WBC 5.9 12/15/2017   HGB 12.1 (L) 12/15/2017   HCT 37.4 (L) 12/15/2017   MCV 84.0 12/15/2017   PLT 166 12/15/2017     Chemistry      Component Value Date/Time   NA 141 12/15/2017 1538   K 4.3 12/15/2017 1538   CL 105 12/15/2017 1538   CO2 29 12/15/2017 1538   BUN 25 (H) 12/15/2017 1538   CREATININE 1.80 (H) 12/15/2017 1538      Component Value Date/Time   CALCIUM 9.1 12/15/2017 1538   ALKPHOS 82 12/15/2017 1538   AST 25 12/15/2017 1538   ALT 17 12/15/2017 1538   BILITOT 0.6 12/15/2017 1538       Impression and Plan: Barry Curry is a 77 year old white male.  He has stage IIIA clear cell carcinoma of the left kidney.  This was resected at the end of January.  Hopefully, by giving him 2 weeks off in between treatments of Sutent, this will help.  I still fully believe that we need to get the pacemaker re-calibrated.  I am sure that his cardiologist  will do this.  I would like to see him back in 1 month.  I would not do another CT scan on him probably until August or September.  Volanda Napoleon, MD 6/19/20194:43 PM

## 2017-12-17 ENCOUNTER — Other Ambulatory Visit: Payer: Self-pay | Admitting: *Deleted

## 2017-12-17 ENCOUNTER — Other Ambulatory Visit: Payer: Self-pay | Admitting: Hematology & Oncology

## 2017-12-17 DIAGNOSIS — C642 Malignant neoplasm of left kidney, except renal pelvis: Secondary | ICD-10-CM

## 2017-12-17 MED ORDER — SUNITINIB MALATE 25 MG PO CAPS: ORAL_CAPSULE | ORAL | 11 refills | Status: DC

## 2017-12-28 ENCOUNTER — Telehealth: Payer: Self-pay | Admitting: *Deleted

## 2017-12-28 MED FILL — SUTENT 25 MG CAPSULE: 25 | 28 days supply | Qty: 14 | Fill #0

## 2017-12-28 NOTE — Telephone Encounter (Signed)
Received call from patients daughter who states he is in [facility].  Was admitted for SOB, UTI, fluid around right lung which was drained of 670 ml..  Daughter wanted to let us know he was in the hospital. Patient currently off of Eliquis and Sutent. Patient will follow up with [place] on 01/12/18

## 2017-12-29 MED ORDER — VITAMIN B-6 100 MG PO TABS
100.00 | ORAL_TABLET | ORAL | Status: DC
Start: 2017-12-30 — End: 2017-12-29

## 2017-12-29 MED ORDER — PANTOPRAZOLE SODIUM 40 MG PO TBEC
40.00 | DELAYED_RELEASE_TABLET | ORAL | Status: DC
Start: 2017-12-30 — End: 2017-12-29

## 2017-12-29 MED ORDER — ALBUTEROL SULFATE HFA 108 (90 BASE) MCG/ACT IN AERS
2.00 | INHALATION_SPRAY | RESPIRATORY_TRACT | Status: DC
Start: ? — End: 2017-12-29

## 2017-12-29 MED ORDER — ACETAMINOPHEN 325 MG PO TABS
650.00 | ORAL_TABLET | ORAL | Status: DC
Start: ? — End: 2017-12-29

## 2017-12-29 MED ORDER — LORAZEPAM 0.5 MG PO TABS
0.50 | ORAL_TABLET | ORAL | Status: DC
Start: ? — End: 2017-12-29

## 2017-12-29 MED ORDER — GENERIC EXTERNAL MEDICATION
1.50 g | Status: DC
Start: 2017-12-30 — End: 2017-12-29

## 2017-12-29 MED ORDER — METOPROLOL TARTRATE 25 MG PO TABS
12.50 | ORAL_TABLET | ORAL | Status: DC
Start: 2017-12-29 — End: 2017-12-29

## 2017-12-29 MED ORDER — ONDANSETRON HCL 4 MG/2ML IJ SOLN
4.00 | INTRAMUSCULAR | Status: DC
Start: ? — End: 2017-12-29

## 2017-12-29 MED ORDER — HYDRALAZINE HCL 20 MG/ML IJ SOLN
10.00 | INTRAMUSCULAR | Status: DC
Start: ? — End: 2017-12-29

## 2017-12-29 MED ORDER — TAMSULOSIN HCL 0.4 MG PO CAPS
0.40 | ORAL_CAPSULE | ORAL | Status: DC
Start: 2017-12-29 — End: 2017-12-29

## 2017-12-29 MED ORDER — ONDANSETRON 4 MG PO TBDP
4.00 | ORAL_TABLET | ORAL | Status: DC
Start: ? — End: 2017-12-29

## 2017-12-29 MED ORDER — LORAZEPAM 1 MG PO TABS
1.00 | ORAL_TABLET | ORAL | Status: DC
Start: 2017-12-29 — End: 2017-12-29

## 2017-12-29 MED ORDER — CYCLOBENZAPRINE HCL 10 MG PO TABS
5.00 | ORAL_TABLET | ORAL | Status: DC
Start: ? — End: 2017-12-29

## 2017-12-29 MED ORDER — GENERIC EXTERNAL MEDICATION
1.00 g | Status: DC
Start: 2017-12-30 — End: 2017-12-29

## 2017-12-29 MED ORDER — DRONEDARONE HCL 400 MG PO TABS
400.00 | ORAL_TABLET | ORAL | Status: DC
Start: 2017-12-29 — End: 2017-12-29

## 2018-01-12 ENCOUNTER — Inpatient Hospital Stay: Payer: Medicare Other | Attending: Hematology & Oncology | Admitting: Hematology & Oncology

## 2018-01-12 ENCOUNTER — Inpatient Hospital Stay: Payer: Medicare Other

## 2018-01-12 VITALS — BP 107/47 | HR 83 | Temp 98.0°F | Resp 17 | Wt 203.5 lb

## 2018-01-12 DIAGNOSIS — J90 Pleural effusion, not elsewhere classified: Secondary | ICD-10-CM | POA: Diagnosis not present

## 2018-01-12 DIAGNOSIS — C642 Malignant neoplasm of left kidney, except renal pelvis: Secondary | ICD-10-CM | POA: Diagnosis present

## 2018-01-12 DIAGNOSIS — R197 Diarrhea, unspecified: Secondary | ICD-10-CM | POA: Diagnosis not present

## 2018-01-12 DIAGNOSIS — D508 Other iron deficiency anemias: Secondary | ICD-10-CM

## 2018-01-12 LAB — CBC WITH DIFFERENTIAL (CANCER CENTER ONLY)
BASOS ABS: 0.1 10*3/uL (ref 0.0–0.1)
Basophils Relative: 1 %
EOS PCT: 1 %
Eosinophils Absolute: 0.1 10*3/uL (ref 0.0–0.5)
HEMATOCRIT: 32.7 % — AB (ref 38.7–49.9)
Hemoglobin: 10.5 g/dL — ABNORMAL LOW (ref 13.0–17.1)
LYMPHS ABS: 0.8 10*3/uL — AB (ref 0.9–3.3)
LYMPHS PCT: 9 %
MCH: 27.9 pg — AB (ref 28.0–33.4)
MCHC: 32.1 g/dL (ref 32.0–35.9)
MCV: 86.7 fL (ref 82.0–98.0)
Monocytes Absolute: 1.1 10*3/uL — ABNORMAL HIGH (ref 0.1–0.9)
Monocytes Relative: 13 %
Neutro Abs: 6.1 10*3/uL (ref 1.5–6.5)
Neutrophils Relative %: 76 %
Platelet Count: 253 10*3/uL (ref 145–400)
RBC: 3.77 MIL/uL — AB (ref 4.20–5.70)
RDW: 19.3 % — AB (ref 11.1–15.7)
WBC: 8.1 10*3/uL (ref 4.0–10.0)

## 2018-01-12 LAB — CMP (CANCER CENTER ONLY)
ALK PHOS: 96 U/L — AB (ref 26–84)
ALT: 15 U/L (ref 10–47)
ANION GAP: 8 (ref 5–15)
AST: 22 U/L (ref 11–38)
Albumin: 3.4 g/dL — ABNORMAL LOW (ref 3.5–5.0)
BILIRUBIN TOTAL: 0.6 mg/dL (ref 0.2–1.6)
BUN: 23 mg/dL — ABNORMAL HIGH (ref 7–22)
CHLORIDE: 103 mmol/L (ref 98–108)
CO2: 29 mmol/L (ref 18–33)
Calcium: 8.8 mg/dL (ref 8.0–10.3)
Creatinine: 1.6 mg/dL — ABNORMAL HIGH (ref 0.60–1.20)
Glucose, Bld: 110 mg/dL (ref 73–118)
Potassium: 4.8 mmol/L — ABNORMAL HIGH (ref 3.3–4.7)
Sodium: 140 mmol/L (ref 128–145)
Total Protein: 6.7 g/dL (ref 6.4–8.1)

## 2018-01-12 MED ORDER — PANCRELIPASE (LIP-PROT-AMYL) 36000-114000 UNITS PO CPEP: 72000.0000 [IU] | ORAL_CAPSULE | Freq: Two times a day (BID) | ORAL | 4 refills | Status: DC

## 2018-01-12 NOTE — Progress Notes (Signed)
Hematology and Oncology Follow Up Visit  Barry Curry [id number] [birth year] 77 y.o. 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 daily (2 weeks on/2 weeks off)  -  S/p cycle             #1 -- patient refused any further on 01/12/2018     Interim History:  Barry Curry is back for follow-up.  Unfortunately, he was back in the hospital recently.  He was at [facility].  He had a right pleural effusion.  This was bloody.  I am sure that this had something to do with him being on Eliquis.  He also was on aspirin.  These have been stopped.  He has daughter were told that I would decide about getting back on these medications.  I told him that since these were for his heart, his cardiologist has to make that decision.  I would not think that the pleural effusion was malignant.  Again, I do not have any record to find out.Marland Kitchen   He does not feel that well.  He does not have a lot of energy.  He does not want to take Sutent any further.  I really do not think this would be a problem.  His quality of life clearly is suffering.  I do not think it is from the Sutent however.  He is not eating much.  He has no appetite.  I just wonder if all these cardiac meds that he is on might be causing some issues for him.  There is been no nausea or vomiting.  He has had no rashes.  Is had no leg swelling.  He says he has diarrhea.  He says he has mucus that comes out.  Why he has not told his to his family doctor, I am not sure.  I will try him on some Creon to see if this may help.  If not, he will have to be referred to gastroenterology for likely colonoscopy.  There has been no fever.  He has had no headache.  He does have this cough.  It is a dry cough.  Overall, I would say performance status is ECOG 3.  Medications:  Current  Outpatient Medications:  .  Dronedarone HCl (MULTAQ PO), Take by mouth., Disp: , Rfl:  .  IRON PO, Take by mouth., Disp: , Rfl:  .  METOPROLOL SUCCINATE PO, Take by mouth., Disp: , Rfl:  .  acetaminophen (TYLENOL) 500 MG tablet, Take 500 mg by mouth 2 (two) times daily., Disp: , Rfl:  .  diazepam (VALIUM) 5 MG tablet, Give one hour prior to exam., Disp: 1 tablet, Rfl: 0 .  diclofenac sodium (VOLTAREN) 1 % GEL, Apply 4 g topically., Disp: , Rfl:  .  Ferrous Sulfate Dried (FERROUS SULFATE CR PO), Take by mouth daily., Disp: , Rfl:  .  fluticasone (FLONASE) 50 MCG/ACT nasal spray, Place 2 sprays into both nostrils at bedtime., Disp: , Rfl:  .  lisinopril (PRINIVIL,ZESTRIL) 2.5 MG tablet, Take 10 mg by mouth daily., Disp: , Rfl:  .  loperamide (IMODIUM) 2 MG capsule, Take 2 mg by mouth as needed. , Disp: , Rfl:  .  LORazepam (ATIVAN) 0.5 MG tablet, Take 0.5 mg by mouth every 8 (eight) hours as needed for anxiety., Disp: , Rfl:  .  pantoprazole (PROTONIX) 40 MG tablet, Take 40 mg by mouth daily., Disp: , Rfl:  .  tamsulosin (FLOMAX) 0.4 MG CAPS capsule, Take 0.4 mg by mouth daily after supper., Disp: , Rfl:  .  vitamin B-6 (PYRIDOXINE) 25 MG tablet, Take 25 mg by mouth 2 (two) times daily., Disp: , Rfl:   Allergies:  Allergies  Allergen Reactions  . Penicillins Rash    Has patient had a PCN reaction causing immediate rash, facial/tongue/throat swelling, SOB or lightheadedness with hypotension: No Has patient had a PCN reaction causing severe rash involving mucus membranes or skin necrosis: No Has patient had a PCN reaction that required hospitalization: No Has patient had a PCN reaction occurring within the last 10 years: No If all of the above answers are "NO", then may proceed with Cephalosporin use.     Past Medical History, Surgical history, Social history, and Family History were reviewed and updated.  Review of Systems: Review of Systems  Constitutional: Positive for fatigue.  HENT:   Negative.   Eyes: Negative.   Respiratory: Negative.   Cardiovascular: Positive for palpitations.  Gastrointestinal: Negative.   Endocrine: Negative.   Genitourinary: Negative.    Musculoskeletal: Negative.   Skin: Negative.   Neurological: Negative.   Hematological: Negative.   Psychiatric/Behavioral: Negative.     Physical Exam:  weight is 203 lb 8 oz (92.3 kg). His oral temperature is 98 F (36.7 C). His blood pressure is 107/47 (abnormal) and his pulse is 83. His respiration is 17 and oxygen saturation is 99%.   Wt Readings from Last 3 Encounters:  01/12/18 203 lb 8 oz (92.3 kg)  12/15/17 205 lb (93 kg)  11/17/17 206 lb 6.4 oz (93.6 kg)    Physical Exam  Constitutional: He is oriented to person, place, and time.  HENT:  Head: Normocephalic and atraumatic.  Mouth/Throat: Oropharynx is clear and moist.  Eyes: Pupils are equal, round, and reactive to light. EOM are normal.  Neck: Normal range of motion.  Cardiovascular: Normal rate, regular rhythm and normal heart sounds.  Pulmonary/Chest: Effort normal and breath sounds normal.  Abdominal: Soft. Bowel sounds are normal.  Musculoskeletal: Normal range of motion. He exhibits no edema, tenderness or deformity.  Lymphadenopathy:    He has no cervical adenopathy.  Neurological: He is alert and oriented to person, place, and time.  Skin: Skin is warm and dry. No rash noted. No erythema.  Psychiatric: He has a normal mood and affect. His behavior is normal. Judgment and thought content normal.  Vitals reviewed.    Lab Results  Component Value Date   WBC 8.1 01/12/2018   HGB 10.5 (L) 01/12/2018   HCT 32.7 (L) 01/12/2018   MCV 86.7 01/12/2018   PLT 253 01/12/2018     Chemistry      Component Value Date/Time   NA 140 01/12/2018 1504   K 4.8 (H) 01/12/2018 1504   CL 103 01/12/2018 1504   CO2 29 01/12/2018 1504   BUN 23 (H) 01/12/2018 1504   CREATININE 1.60 (H) 01/12/2018 1504      Component Value Date/Time    CALCIUM 8.8 01/12/2018 1504   ALKPHOS 96 (H) 01/12/2018 1504   AST 22 01/12/2018 1504   ALT 15 01/12/2018 1504   BILITOT 0.6 01/12/2018 1504       Impression and Plan: Barry Curry is a 77 year old white male.  He has stage IIIA clear cell carcinoma of the left kidney.  This was resected at the end of January.  We will stop the Sutent.  I do not think that this is going to cause any issues with respect to increasing his risk of recurrent disease.  We will do a surveillance CT scan on him.  I think that these will be necessary.  I told him that I would do a surveillance CT scan every 4 months for the first 2 years and then 6 months after that.  I will plan to see him back in 4 weeks.  Hopefully, the Creon will help him eat better and may help with this mucousy stool that he has.  Volanda Napoleon, MD 7/17/20194:07 PM

## 2018-01-13 LAB — LACTATE DEHYDROGENASE: LDH: 223 U/L — AB (ref 98–192)

## 2018-01-21 ENCOUNTER — Other Ambulatory Visit: Payer: Self-pay

## 2018-01-21 ENCOUNTER — Encounter (HOSPITAL_BASED_OUTPATIENT_CLINIC_OR_DEPARTMENT_OTHER): Payer: Self-pay | Admitting: *Deleted

## 2018-01-21 ENCOUNTER — Emergency Department (HOSPITAL_BASED_OUTPATIENT_CLINIC_OR_DEPARTMENT_OTHER): Payer: Medicare Other

## 2018-01-21 ENCOUNTER — Inpatient Hospital Stay (HOSPITAL_BASED_OUTPATIENT_CLINIC_OR_DEPARTMENT_OTHER)
Admission: EM | Admit: 2018-01-21 | Discharge: 2018-02-07 | DRG: 163 | Disposition: A | Discharge status: Skilled Nursing Facility | Payer: Medicare Other | Attending: Surgery | Admitting: Surgery

## 2018-01-21 ENCOUNTER — Other Ambulatory Visit (HOSPITAL_BASED_OUTPATIENT_CLINIC_OR_DEPARTMENT_OTHER): Payer: Self-pay

## 2018-01-21 DIAGNOSIS — N183 Chronic kidney disease, stage 3 unspecified: Secondary | ICD-10-CM | POA: Diagnosis present

## 2018-01-21 DIAGNOSIS — Z85528 Personal history of other malignant neoplasm of kidney: Secondary | ICD-10-CM

## 2018-01-21 DIAGNOSIS — F419 Anxiety disorder, unspecified: Secondary | ICD-10-CM | POA: Diagnosis present

## 2018-01-21 DIAGNOSIS — Z833 Family history of diabetes mellitus: Secondary | ICD-10-CM

## 2018-01-21 DIAGNOSIS — Z96643 Presence of artificial hip joint, bilateral: Secondary | ICD-10-CM | POA: Diagnosis present

## 2018-01-21 DIAGNOSIS — R197 Diarrhea, unspecified: Secondary | ICD-10-CM | POA: Diagnosis present

## 2018-01-21 DIAGNOSIS — Z96611 Presence of right artificial shoulder joint: Secondary | ICD-10-CM | POA: Diagnosis present

## 2018-01-21 DIAGNOSIS — Z9889 Other specified postprocedural states: Secondary | ICD-10-CM

## 2018-01-21 DIAGNOSIS — R7989 Other specified abnormal findings of blood chemistry: Secondary | ICD-10-CM | POA: Diagnosis present

## 2018-01-21 DIAGNOSIS — J939 Pneumothorax, unspecified: Secondary | ICD-10-CM

## 2018-01-21 DIAGNOSIS — R531 Weakness: Secondary | ICD-10-CM | POA: Diagnosis not present

## 2018-01-21 DIAGNOSIS — Z96612 Presence of left artificial shoulder joint: Secondary | ICD-10-CM | POA: Diagnosis present

## 2018-01-21 DIAGNOSIS — I429 Cardiomyopathy, unspecified: Secondary | ICD-10-CM | POA: Diagnosis present

## 2018-01-21 DIAGNOSIS — F329 Major depressive disorder, single episode, unspecified: Secondary | ICD-10-CM | POA: Diagnosis present

## 2018-01-21 DIAGNOSIS — Z87891 Personal history of nicotine dependence: Secondary | ICD-10-CM

## 2018-01-21 DIAGNOSIS — I10 Essential (primary) hypertension: Secondary | ICD-10-CM | POA: Diagnosis present

## 2018-01-21 DIAGNOSIS — R0602 Shortness of breath: Secondary | ICD-10-CM

## 2018-01-21 DIAGNOSIS — F40240 Claustrophobia: Secondary | ICD-10-CM | POA: Diagnosis present

## 2018-01-21 DIAGNOSIS — K219 Gastro-esophageal reflux disease without esophagitis: Secondary | ICD-10-CM | POA: Diagnosis present

## 2018-01-21 DIAGNOSIS — J910 Malignant pleural effusion: Secondary | ICD-10-CM | POA: Diagnosis present

## 2018-01-21 DIAGNOSIS — D509 Iron deficiency anemia, unspecified: Secondary | ICD-10-CM | POA: Diagnosis present

## 2018-01-21 DIAGNOSIS — D500 Iron deficiency anemia secondary to blood loss (chronic): Secondary | ICD-10-CM

## 2018-01-21 DIAGNOSIS — D62 Acute posthemorrhagic anemia: Secondary | ICD-10-CM | POA: Diagnosis present

## 2018-01-21 DIAGNOSIS — C782 Secondary malignant neoplasm of pleura: Principal | ICD-10-CM | POA: Diagnosis present

## 2018-01-21 DIAGNOSIS — J90 Pleural effusion, not elsewhere classified: Secondary | ICD-10-CM | POA: Diagnosis not present

## 2018-01-21 DIAGNOSIS — Z85820 Personal history of malignant melanoma of skin: Secondary | ICD-10-CM

## 2018-01-21 DIAGNOSIS — R778 Other specified abnormalities of plasma proteins: Secondary | ICD-10-CM | POA: Diagnosis present

## 2018-01-21 DIAGNOSIS — C642 Malignant neoplasm of left kidney, except renal pelvis: Secondary | ICD-10-CM | POA: Diagnosis present

## 2018-01-21 DIAGNOSIS — I440 Atrioventricular block, first degree: Secondary | ICD-10-CM | POA: Diagnosis present

## 2018-01-21 DIAGNOSIS — Z905 Acquired absence of kidney: Secondary | ICD-10-CM

## 2018-01-21 DIAGNOSIS — I480 Paroxysmal atrial fibrillation: Secondary | ICD-10-CM | POA: Diagnosis present

## 2018-01-21 DIAGNOSIS — E785 Hyperlipidemia, unspecified: Secondary | ICD-10-CM | POA: Diagnosis present

## 2018-01-21 DIAGNOSIS — J9601 Acute respiratory failure with hypoxia: Secondary | ICD-10-CM | POA: Diagnosis present

## 2018-01-21 DIAGNOSIS — R0600 Dyspnea, unspecified: Secondary | ICD-10-CM

## 2018-01-21 DIAGNOSIS — D508 Other iron deficiency anemias: Secondary | ICD-10-CM

## 2018-01-21 DIAGNOSIS — N179 Acute kidney failure, unspecified: Secondary | ICD-10-CM | POA: Diagnosis present

## 2018-01-21 DIAGNOSIS — I2510 Atherosclerotic heart disease of native coronary artery without angina pectoris: Secondary | ICD-10-CM | POA: Diagnosis present

## 2018-01-21 DIAGNOSIS — I700 Atherosclerosis of aorta: Secondary | ICD-10-CM | POA: Diagnosis present

## 2018-01-21 DIAGNOSIS — Z950 Presence of cardiac pacemaker: Secondary | ICD-10-CM

## 2018-01-21 DIAGNOSIS — K5900 Constipation, unspecified: Secondary | ICD-10-CM | POA: Diagnosis not present

## 2018-01-21 DIAGNOSIS — E278 Other specified disorders of adrenal gland: Secondary | ICD-10-CM | POA: Diagnosis present

## 2018-01-21 DIAGNOSIS — E440 Moderate protein-calorie malnutrition: Secondary | ICD-10-CM

## 2018-01-21 DIAGNOSIS — B965 Pseudomonas (aeruginosa) (mallei) (pseudomallei) as the cause of diseases classified elsewhere: Secondary | ICD-10-CM | POA: Diagnosis present

## 2018-01-21 DIAGNOSIS — Z79899 Other long term (current) drug therapy: Secondary | ICD-10-CM

## 2018-01-21 DIAGNOSIS — Z6827 Body mass index (BMI) 27.0-27.9, adult: Secondary | ICD-10-CM

## 2018-01-21 DIAGNOSIS — Z96653 Presence of artificial knee joint, bilateral: Secondary | ICD-10-CM | POA: Diagnosis present

## 2018-01-21 DIAGNOSIS — R748 Abnormal levels of other serum enzymes: Secondary | ICD-10-CM

## 2018-01-21 DIAGNOSIS — Z01818 Encounter for other preprocedural examination: Secondary | ICD-10-CM

## 2018-01-21 DIAGNOSIS — I129 Hypertensive chronic kidney disease with stage 1 through stage 4 chronic kidney disease, or unspecified chronic kidney disease: Secondary | ICD-10-CM | POA: Diagnosis present

## 2018-01-21 DIAGNOSIS — I959 Hypotension, unspecified: Secondary | ICD-10-CM | POA: Diagnosis present

## 2018-01-21 LAB — CBC WITH DIFFERENTIAL/PLATELET
BASOS ABS: 0 10*3/uL (ref 0.0–0.1)
BASOS PCT: 0 %
EOS ABS: 0.1 10*3/uL (ref 0.0–0.7)
Eosinophils Relative: 1 %
HCT: 30.8 % — ABNORMAL LOW (ref 39.0–52.0)
Hemoglobin: 10.1 g/dL — ABNORMAL LOW (ref 13.0–17.0)
Lymphocytes Relative: 8 %
Lymphs Abs: 0.8 10*3/uL (ref 0.7–4.0)
MCH: 27.7 pg (ref 26.0–34.0)
MCHC: 32.8 g/dL (ref 30.0–36.0)
MCV: 84.6 fL (ref 78.0–100.0)
Monocytes Absolute: 1.2 10*3/uL — ABNORMAL HIGH (ref 0.1–1.0)
Monocytes Relative: 12 %
NEUTROS PCT: 79 %
Neutro Abs: 8 10*3/uL — ABNORMAL HIGH (ref 1.7–7.7)
PLATELETS: 155 10*3/uL (ref 150–400)
RBC: 3.64 MIL/uL — AB (ref 4.22–5.81)
RDW: 18.3 % — ABNORMAL HIGH (ref 11.5–15.5)
WBC: 10.2 10*3/uL (ref 4.0–10.5)

## 2018-01-21 LAB — I-STAT CG4 LACTIC ACID, ED: LACTIC ACID, VENOUS: 1.5 mmol/L (ref 0.5–1.9)

## 2018-01-21 LAB — URINALYSIS, ROUTINE W REFLEX MICROSCOPIC
BILIRUBIN URINE: NEGATIVE
Glucose, UA: NEGATIVE mg/dL
Hgb urine dipstick: NEGATIVE
KETONES UR: NEGATIVE mg/dL
Leukocytes, UA: NEGATIVE
NITRITE: NEGATIVE
PH: 5.5 (ref 5.0–8.0)
PROTEIN: NEGATIVE mg/dL
Specific Gravity, Urine: <1.005 — ABNORMAL LOW (ref 1.005–1.030)

## 2018-01-21 LAB — MRSA PCR SCREENING: MRSA BY PCR: NEGATIVE

## 2018-01-21 LAB — COMPREHENSIVE METABOLIC PANEL
ALT: 13 U/L (ref 0–44)
ANION GAP: 6 (ref 5–15)
AST: 22 U/L (ref 15–41)
Albumin: 3.3 g/dL — ABNORMAL LOW (ref 3.5–5.0)
Alkaline Phosphatase: 82 U/L (ref 38–126)
BUN: 23 mg/dL (ref 8–23)
CALCIUM: 8.6 mg/dL — AB (ref 8.9–10.3)
CO2: 24 mmol/L (ref 22–32)
CREATININE: 1.53 mg/dL — AB (ref 0.61–1.24)
Chloride: 104 mmol/L (ref 98–111)
GFR, EST AFRICAN AMERICAN: 49 mL/min — AB (ref >60)
GFR, EST NON AFRICAN AMERICAN: 42 mL/min — AB (ref >60)
Glucose, Bld: 117 mg/dL — ABNORMAL HIGH (ref 70–99)
Potassium: 4.5 mmol/L (ref 3.5–5.1)
SODIUM: 134 mmol/L — AB (ref 135–145)
Total Bilirubin: 0.4 mg/dL (ref 0.3–1.2)
Total Protein: 6.6 g/dL (ref 6.5–8.1)

## 2018-01-21 LAB — PROTIME-INR
INR: 1.08
PROTHROMBIN TIME: 13.9 s (ref 11.4–15.2)

## 2018-01-21 LAB — BRAIN NATRIURETIC PEPTIDE: B Natriuretic Peptide: 80.8 pg/mL (ref 0.0–100.0)

## 2018-01-21 LAB — TROPONIN I
TROPONIN I: 0.03 ng/mL — AB (ref <0.03)
Troponin I: <0.03 ng/mL (ref <0.03)

## 2018-01-21 LAB — APTT: aPTT: 42 seconds — ABNORMAL HIGH (ref 24–36)

## 2018-01-21 MED ORDER — VITAMIN B-6 25 MG PO TABS
25.0000 mg | ORAL_TABLET | Freq: Two times a day (BID) | ORAL | Status: DC
  Administered 2018-01-21 – 2018-02-07 (×28): 25 mg via ORAL
  Filled 2018-01-21 (×36): qty 1

## 2018-01-21 MED ORDER — TAMSULOSIN HCL 0.4 MG PO CAPS
0.4000 mg | ORAL_CAPSULE | Freq: Every day | ORAL | Status: DC
  Administered 2018-01-22 – 2018-02-06 (×15): 0.4 mg via ORAL
  Filled 2018-01-21 (×16): qty 1

## 2018-01-21 MED ORDER — HYDRALAZINE HCL 20 MG/ML IJ SOLN: 5.0000 mg | INTRAMUSCULAR | Status: DC | PRN

## 2018-01-21 MED ORDER — LEVALBUTEROL HCL 1.25 MG/0.5ML IN NEBU
1.2500 mg | INHALATION_SOLUTION | Freq: Four times a day (QID) | RESPIRATORY_TRACT | Status: DC
  Administered 2018-01-22 (×3): 1.25 mg via RESPIRATORY_TRACT
  Filled 2018-01-21 (×4): qty 0.5

## 2018-01-21 MED ORDER — IOPAMIDOL (ISOVUE-370) INJECTION 76%
100.0000 mL | Freq: Once | INTRAVENOUS | Status: AC | PRN
  Administered 2018-01-21: 80 mL via INTRAVENOUS

## 2018-01-21 MED ORDER — VANCOMYCIN HCL 500 MG IV SOLR
INTRAVENOUS | Status: AC
  Filled 2018-01-21: qty 1500

## 2018-01-21 MED ORDER — PANTOPRAZOLE SODIUM 40 MG PO TBEC
40.0000 mg | DELAYED_RELEASE_TABLET | Freq: Every day | ORAL | Status: DC
  Administered 2018-01-22 – 2018-01-31 (×10): 40 mg via ORAL
  Filled 2018-01-21 (×10): qty 1

## 2018-01-21 MED ORDER — DRONEDARONE HCL 400 MG PO TABS
400.0000 mg | ORAL_TABLET | Freq: Two times a day (BID) | ORAL | Status: DC
  Administered 2018-01-21 – 2018-02-07 (×32): 400 mg via ORAL
  Filled 2018-01-21 (×36): qty 1

## 2018-01-21 MED ORDER — SODIUM CHLORIDE 0.9 % IV SOLN
1.0000 g | Freq: Once | INTRAVENOUS | Status: AC
  Administered 2018-01-21: 1 g via INTRAVENOUS

## 2018-01-21 MED ORDER — DIAZEPAM 5 MG PO TABS
5.0000 mg | ORAL_TABLET | Freq: Two times a day (BID) | ORAL | Status: DC | PRN
  Administered 2018-01-25 – 2018-01-26 (×2): 5 mg via ORAL
  Filled 2018-01-21 (×2): qty 1

## 2018-01-21 MED ORDER — DOXYCYCLINE HYCLATE 100 MG PO TABS
100.0000 mg | ORAL_TABLET | Freq: Two times a day (BID) | ORAL | Status: DC
  Administered 2018-01-21 – 2018-01-24 (×6): 100 mg via ORAL
  Filled 2018-01-21 (×6): qty 1

## 2018-01-21 MED ORDER — DM-GUAIFENESIN ER 30-600 MG PO TB12
1.0000 | ORAL_TABLET | Freq: Two times a day (BID) | ORAL | Status: DC | PRN
  Administered 2018-02-02: 1 via ORAL
  Filled 2018-01-21 (×2): qty 1

## 2018-01-21 MED ORDER — ONDANSETRON HCL 4 MG/2ML IJ SOLN
4.0000 mg | Freq: Four times a day (QID) | INTRAMUSCULAR | Status: DC | PRN
  Administered 2018-02-01: 4 mg via INTRAVENOUS

## 2018-01-21 MED ORDER — VANCOMYCIN HCL 10 G IV SOLR
1500.0000 mg | Freq: Once | INTRAVENOUS | Status: AC
  Administered 2018-01-21: 1500 mg via INTRAVENOUS
  Filled 2018-01-21: qty 1500

## 2018-01-21 MED ORDER — ACETAMINOPHEN 500 MG PO TABS
500.0000 mg | ORAL_TABLET | Freq: Four times a day (QID) | ORAL | Status: DC | PRN
  Administered 2018-01-27 – 2018-01-30 (×4): 500 mg via ORAL
  Filled 2018-01-21 (×4): qty 1

## 2018-01-21 MED ORDER — SODIUM CHLORIDE 0.9 % IV BOLUS
1000.0000 mL | Freq: Once | INTRAVENOUS | Status: AC
  Administered 2018-01-21: 1000 mL via INTRAVENOUS

## 2018-01-21 MED ORDER — FERROUS SULFATE 325 (65 FE) MG PO TABS
325.0000 mg | ORAL_TABLET | Freq: Every day | ORAL | Status: DC
  Administered 2018-01-22 – 2018-01-24 (×3): 325 mg via ORAL
  Filled 2018-01-21 (×3): qty 1

## 2018-01-21 MED ORDER — FLUTICASONE PROPIONATE 50 MCG/ACT NA SUSP
2.0000 | Freq: Every day | NASAL | Status: DC
  Administered 2018-01-21 – 2018-02-06 (×14): 2 via NASAL
  Filled 2018-01-21 (×2): qty 16

## 2018-01-21 MED ORDER — PANCRELIPASE (LIP-PROT-AMYL) 12000-38000 UNITS PO CPEP
72000.0000 [IU] | ORAL_CAPSULE | Freq: Two times a day (BID) | ORAL | Status: DC
  Administered 2018-01-22 – 2018-02-05 (×23): 72000 [IU] via ORAL
  Filled 2018-01-21 (×25): qty 2
  Filled 2018-01-21: qty 6
  Filled 2018-01-21 (×6): qty 2

## 2018-01-21 MED ORDER — CEFEPIME HCL 1 G IJ SOLR
INTRAMUSCULAR | Status: AC
  Filled 2018-01-21: qty 1

## 2018-01-21 MED ORDER — ONDANSETRON HCL 4 MG PO TABS: 4.0000 mg | ORAL_TABLET | Freq: Four times a day (QID) | ORAL | Status: DC | PRN

## 2018-01-21 MED ORDER — LORAZEPAM 2 MG/ML IJ SOLN
0.5000 mg | Freq: Once | INTRAMUSCULAR | Status: AC
  Administered 2018-01-21: 0.5 mg via INTRAVENOUS
  Filled 2018-01-21: qty 1

## 2018-01-21 MED ORDER — METOPROLOL SUCCINATE ER 25 MG PO TB24
25.0000 mg | ORAL_TABLET | Freq: Every day | ORAL | Status: DC
  Administered 2018-01-22 – 2018-01-31 (×9): 25 mg via ORAL
  Filled 2018-01-21 (×11): qty 1

## 2018-01-21 MED ORDER — DICLOFENAC SODIUM 1 % TD GEL
4.0000 g | Freq: Four times a day (QID) | TRANSDERMAL | Status: DC | PRN
  Filled 2018-01-21 (×2): qty 100

## 2018-01-21 MED ORDER — LOPERAMIDE HCL 2 MG PO CAPS: 2.0000 mg | ORAL_CAPSULE | ORAL | Status: DC | PRN

## 2018-01-21 MED ORDER — ZOLPIDEM TARTRATE 5 MG PO TABS
5.0000 mg | ORAL_TABLET | Freq: Every evening | ORAL | Status: DC | PRN
  Administered 2018-01-21 – 2018-01-24 (×2): 5 mg via ORAL
  Filled 2018-01-21 (×3): qty 1

## 2018-01-21 MED ORDER — VANCOMYCIN HCL 10 G IV SOLR
1250.0000 mg | INTRAVENOUS | Status: DC
  Filled 2018-01-21: qty 1250

## 2018-01-21 NOTE — ED Triage Notes (Signed)
He was at his MD's office and told his BP was low and told him to come here. Hx of hypotension.

## 2018-01-21 NOTE — Progress Notes (Signed)
77 year old with history renal cell carcinoma status post nephrectomy, CKD, CAD, anxiety, AF, who presented to med center [facility] today with complaints of shortness of breath and fatigue.  Patient is found to be tachypneic with heart rate in the 30s to 40s.  He is currently not hypoxic.  CTA chest was positive for a large right pleural effusion and possible metastatic disease.  Patient was recently seen at [facility] regional 1 month ago for shortness of breath and was also found to have a pleural effusion.  He did have a thoracentesis which was bloody, however he was on Eliquis as well as aspirin for history of atrial fibrillation.  Since then anticoagulation has been held.  Patient needs admission to [facility] for thoracentesis.  Will admit patient to stepdown, observation given his risk of decompensation given his tachypnea.  Once patient has thoracentesis his shortness of breath will improve.  Patient may also benefit from PT OT consult given his physical deconditioning. May also need to have goals of care conversation.

## 2018-01-21 NOTE — ED Notes (Signed)
Pt on Auto VS

## 2018-01-21 NOTE — Progress Notes (Signed)
Pharmacy Antibiotic Note  Barry Curry is a 77 y.o. male admitted on 01/21/2018 with pneumonia.  Pharmacy has been consulted for vancomycin dosing.  Plan: Vancomycin 1500mg  IV once then 1250mg  IV every 24 hours.  Goal trough 15-20 mcg/mL. Cefepime x1 in the ED Monitor clinical progression and LOT   Height: 6' (182.9 cm) Weight: 203 lb (92.1 kg) IBW/kg (Calculated) : 77.6  Temp (24hrs), Avg:98.2 F (36.8 C), Min:98.2 F (36.8 C), Max:98.2 F (36.8 C)  Recent Labs  Lab 01/21/18 1142 01/21/18 1147  WBC 10.2  --   CREATININE 1.53*  --   LATICACIDVEN  --  1.50    Estimated Creatinine Clearance: 44.4 mL/min (A) (by C-G formula based on SCr of 1.53 mg/dL (H)).    Allergies  Allergen Reactions  . Penicillins Rash    Has patient had a PCN reaction causing immediate rash, facial/tongue/throat swelling, SOB or lightheadedness with hypotension: No Has patient had a PCN reaction causing severe rash involving mucus membranes or skin necrosis: No Has patient had a PCN reaction that required hospitalization: No Has patient had a PCN reaction occurring within the last 10 years: No If all of the above answers are "NO", then may proceed with Cephalosporin use.     Thank you for allowing pharmacy to be a part of this patient's care.  Jodean Lima Josiah Wojtaszek 01/21/2018 12:39 PM

## 2018-01-21 NOTE — H&P (Addendum)
History and Physical    Barry Curry ENI:[id number] DOB: Dec 13, 1940 DOA: 01/21/2018  Referring MD/NP/PA:   PCP: [name], PA-C   Patient coming from:  The patient is coming from home.  At baseline, pt is independent for most of ADL. SNF  Assistant living facility   Retirement center.      Chief Complaint: Shortness of breath  HPI: Barry Curry is a 77 y.o. male with medical history significant of hypertension, hyperlipidemia, GERD, depression, anxiety, atrial fibrillation not on anticoagulants, CAD, PVC, aortic stenosis, right bundle blockage, iron deficiency anemia, CKD, clear renal cell cancer (left nephrectomy), melanoma, who presents with shortness of breath.  Patient states that he had shortness of breath due to right pleural effusion in the past, and had thoracentesis a month ago, which relieved his shortness of breath largely.  In the past several days, he developed shortness of breath again, which has been progressively getting worse.  He has dry cough, but no chest pain.  No fever or chills.  His PCP started him with doxycycline 2 days ago for possible pneumonia, but no significant improvement.  Patient does not have nausea, vomiting, diarrhea or abdominal pain.  He states that he has poor appetite and decreased oral intake.  Sometimes he has loose stool bowel movement.  No symptoms of UTI or unilateral weakness.  Of note, he used to take aspirin and Eliquis for atrial fibrillation, which were stopped since recent thoracentesis.  ED Course: pt was found to have WBC 10.2, troponin 0.03, lactic acid 1.50, INR 1.08, negative urinalysis, stable renal function, temperature 97.5, tachycardia, tachypnea, oxygen saturation 80% on room air, chest x-ray showed right pleural effusion.  Patient is placed on stepdown bed for observation.  CT angiogram chest: 1. Large RIGHT pleural effusion with RIGHT pleural/chest wall nodules. New RIGHT internal mammary lymphadenopathy, small  pulmonary nodules and 2.3 cm RIGHT adrenal nodule are highly suspicious for metastatic disease. 2. No evidence of pulmonary emboli  3. Mild cardiomegaly and aortic valvular calcifications. 4. Coronary artery and Aortic Atherosclerosis (ICD10-I70.0).  Review of Systems:   General: no fevers, chills, no body weight gain, has poor appetite, has fatigue HEENT: no blurry vision, hearing changes or sore throat Respiratory: has dyspnea, coughing, no wheezing CV: no chest pain, no palpitations GI: no nausea, vomiting, abdominal pain, diarrhea, constipation GU: no dysuria, burning on urination, increased urinary frequency, hematuria  Ext: no leg edema Neuro: no unilateral weakness, numbness, or tingling, no vision change or hearing loss Skin: no rash, no skin tear. MSK: No muscle spasm, no deformity, no limitation of range of movement in spin Heme: No easy bruising.  Travel history: No recent long distant travel.  Allergy:  Allergies  Allergen Reactions  . Penicillins Rash    Has patient had a PCN reaction causing immediate rash, facial/tongue/throat swelling, SOB or lightheadedness with hypotension: No Has patient had a PCN reaction causing severe rash involving mucus membranes or skin necrosis: No Has patient had a PCN reaction that required hospitalization: No Has patient had a PCN reaction occurring within the last 10 years: No If all of the above answers are "NO", then may proceed with Cephalosporin use.     Past Medical History:  Diagnosis Date  . Anemia    history of  . Anxiety   . Aortic stenosis   . Arthritis   . Cancer ([place])    Melanoma back of neck  . Cancer of kidney ([place])   . Cardiomyopathy ([place])   .  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 Complication of anesthesia    violent wake up from anesthesia  . Coronary artery disease   . Depression   . Dyspnea   . Esophageal stricture   . Goals of care, counseling/discussion 11/17/2017  . Heart murmur     . History of atrial fibrillation   . History of claustrophobia   . Hyperlipemia   . Hypertension   . Left kidney mass   . Lower extremity edema   . Mitral stenosis   . PVC (premature ventricular contraction)   . RBBB (right bundle branch block with left anterior fascicular block)     Past Surgical History:  Procedure Laterality Date  . CARDIOVERSION    . COLONOSCOPY    . ESOPHAGEAL DILATION    . KNEE ARTHROSCOPY Left   . KNEE ARTHROSCOPY Right   . ROBOT ASSISTED LAPAROSCOPIC NEPHRECTOMY Left 07/23/2017   Procedure: XI ROBOTIC ASSISTED LAPAROSCOPIC NEPHRECTOMY;  Surgeon: Alexis Frock, MD;  Location: [facility];  Service: Urology;  Laterality: Left;  . SHOULDER ARTHROSCOPY N/A   . TOTAL HIP ARTHROPLASTY Left   . TOTAL HIP ARTHROPLASTY Right   . TOTAL KNEE ARTHROPLASTY Left   . TOTAL KNEE ARTHROPLASTY Right   . TOTAL SHOULDER REPLACEMENT Left   . TOTAL SHOULDER REPLACEMENT Right   . UPPER GI ENDOSCOPY      Social History:  reports that he quit smoking about 46 years ago. His smoking use included cigarettes. He has a 0.40 pack-year smoking history. He has never used smokeless tobacco. He reports that he drinks alcohol. He reports that he does not use drugs.  Family History:  Family History  Problem Relation Age of Onset  . Breast cancer Mother   . Diabetes Mellitus II Father   . Diabetes Mellitus II Sister      Prior to Admission medications   Medication Sig Start Date End Date Taking? Authorizing Provider  acetaminophen (TYLENOL) 500 MG tablet Take 500 mg by mouth 2 (two) times daily.    [provider]  diazepam (VALIUM) 5 MG tablet Give one hour prior to exam. 09/01/17   Volanda Napoleon, MD  diclofenac sodium (VOLTAREN) 1 % GEL Apply 4 g topically. 05/26/10   [provider]  Dronedarone HCl (MULTAQ PO) Take by mouth.    [provider]  Ferrous Sulfate Dried (FERROUS SULFATE CR PO) Take by mouth daily.    [provider]  fluticasone  (FLONASE) 50 MCG/ACT nasal spray Place 2 sprays into both nostrils at bedtime.    [provider]  IRON PO Take by mouth.    [provider]  lipase/protease/amylase (CREON) 36000 UNITS CPEP capsule Take 2 capsules (72,000 Units total) by mouth 2 (two) times daily before lunch and supper. 01/12/18   Volanda Napoleon, MD  loperamide (IMODIUM) 2 MG capsule Take 2 mg by mouth as needed.     [provider]  LORazepam (ATIVAN) 0.5 MG tablet Take 0.5 mg by mouth every 8 (eight) hours as needed for anxiety.    [provider]  METOPROLOL SUCCINATE PO Take by mouth.    [provider]  pantoprazole (PROTONIX) 40 MG tablet Take 40 mg by mouth daily.    [provider]  tamsulosin (FLOMAX) 0.4 MG CAPS capsule Take 0.4 mg by mouth daily after supper.    [provider]  vitamin B-6 (PYRIDOXINE) 25 MG tablet Take 25 mg by mouth 2 (two) times daily.  [provider]    Physical Exam: Vitals:   01/21/18 1830 01/21/18 1900 01/21/18 1930 01/21/18 2332  BP: (!) 112/93 140/76 126/84 127/89  Pulse: (!) 102 (!) 103 (!) 106   Resp: (!) 41 (!) 35 (!) 35   Temp:   (!) 97.5 F (36.4 C) 98 F (36.7 C)  TempSrc:   Oral Oral  SpO2: 97% 100% 100%   Weight:      Height:       General: Not in acute distress HEENT:       Eyes: PERRL, EOMI, no scleral icterus.       ENT: No discharge from the ears and nose, no pharynx injection, no tonsillar enlargement.        Neck: No JVD, no bruit, no mass felt. Heme: No neck lymph node enlargement. Cardiac: S1/S2, RRR, 3/6  murmurs, No gallops or rubs. Respiratory: has decreased air movement on the right side. No rales, wheezing, rhonchi or rubs. GI: Soft, nondistended, nontender, no rebound pain, no organomegaly, BS present. GU: No hematuria Ext: No pitting leg edema bilaterally. 2+DP/PT pulse bilaterally. Musculoskeletal: No joint deformities, No joint redness or warmth, no limitation of ROM in  spin. Skin: No rashes.  Neuro: Alert, oriented X3, cranial nerves II-XII grossly intact, moves all extremities normally. Psych: Patient is not psychotic, no suicidal or hemocidal ideation.  Labs on Admission: I have personally reviewed following labs and imaging studies  CBC: Recent Labs  Lab 01/21/18 1142  WBC 10.2  NEUTROABS 8.0*  HGB 10.1*  HCT 30.8*  MCV 84.6  PLT 480   Basic Metabolic Panel: Recent Labs  Lab 01/21/18 1142  NA 134*  K 4.5  CL 104  CO2 24  GLUCOSE 117*  BUN 23  CREATININE 1.53*  CALCIUM 8.6*   GFR: Estimated Creatinine Clearance: 44.4 mL/min (A) (by C-G formula based on SCr of 1.53 mg/dL (H)). Liver Function Tests: Recent Labs  Lab 01/21/18 1142  AST 22  ALT 13  ALKPHOS 82  BILITOT 0.4  PROT 6.6  ALBUMIN 3.3*   No results for input(s): LIPASE, AMYLASE in the last 168 hours. No results for input(s): AMMONIA in the last 168 hours. Coagulation Profile: Recent Labs  Lab 01/21/18 1142  INR 1.08   Cardiac Enzymes: Recent Labs  Lab 01/21/18 1142 01/21/18 2225  TROPONINI 0.03* <0.03   BNP (last 3 results) No results for input(s): PROBNP in the last 8760 hours. HbA1C: No results for input(s): HGBA1C in the last 72 hours. CBG: No results for input(s): GLUCAP in the last 168 hours. Lipid Profile: No results for input(s): CHOL, HDL, LDLCALC, TRIG, CHOLHDL, LDLDIRECT in the last 72 hours. Thyroid Function Tests: No results for input(s): TSH, T4TOTAL, FREET4, T3FREE, THYROIDAB in the last 72 hours. Anemia Panel: No results for input(s): VITAMINB12, FOLATE, FERRITIN, TIBC, IRON, RETICCTPCT in the last 72 hours. Urine analysis:    Component Value Date/Time   COLORURINE YELLOW 01/21/2018 [facility] 01/21/2018 1432   LABSPEC <1.005 (L) 01/21/2018 1432   PHURINE 5.5 01/21/2018 1432   GLUCOSEU NEGATIVE 01/21/2018 1432   HGBUR NEGATIVE 01/21/2018 1432   BILIRUBINUR NEGATIVE 01/21/2018 1432   KETONESUR NEGATIVE 01/21/2018  1432   PROTEINUR NEGATIVE 01/21/2018 1432   UROBILINOGEN 1.0 03/21/2010 1348   NITRITE NEGATIVE 01/21/2018 1432   LEUKOCYTESUR NEGATIVE 01/21/2018 1432   Sepsis Labs: @LABRCNTIP (procalcitonin:4,lacticidven:4) ) Recent Results (from the past 240 hour(s))  MRSA PCR Screening     Status: None   Collection  Time: 01/21/18  8:47 PM  Result Value Ref Range Status   MRSA by PCR NEGATIVE NEGATIVE Final    Comment:        The GeneXpert MRSA Assay (FDA approved for NASAL specimens only), is one component of a comprehensive MRSA colonization surveillance program. It is not intended to diagnose MRSA infection nor to guide or monitor treatment for MRSA infections. Performed at [facility], [place] [address] [place], [place] [address]      Radiological Exams on Admission: Dg Chest 2 View  Result Date: 01/21/2018 CLINICAL DATA:  77 year old male with recent pneumonia. Hypotension and weakness. History of renal cell carcinoma. EXAM: CHEST - 2 VIEW COMPARISON:  Chest radiograph 01/19/2018 and earlier. Chest CTA 12/26/2017. FINDINGS: Persistent small right pleural effusion with fluid tracking into the right minor fissure. Stable lung volumes. Mediastinal contours remain normal. Visualized tracheal air column is within normal limits. Stable left chest cardiac pacemaker. No pneumothorax, pulmonary edema, consolidation, or new pulmonary opacity. Stable visualized osseous structures. Bilateral humerus arthroplasty. Negative visible bowel gas pattern. IMPRESSION: 1. Persistent small right pleural effusion with fluid tracking into the minor fissure. 2. No other acute cardiopulmonary abnormality. Electronically Signed   By: Genevie Ann M.D.   On: 01/21/2018 12:33   Ct Angio Chest Pe W/cm &/or Wo Cm  Result Date: 01/21/2018 CLINICAL DATA:  77 year old male with acute shortness of breath and hypotension today. History of LEFT renal carcinoma and LEFT nephrectomy in 2019. EXAM: CT ANGIOGRAPHY CHEST WITH  CONTRAST TECHNIQUE: Multidetector CT imaging of the chest was performed using the standard protocol during bolus administration of intravenous contrast. Multiplanar CT image reconstructions and MIPs were obtained to evaluate the vascular anatomy. CONTRAST:  47mL ISOVUE-370 IOPAMIDOL (ISOVUE-370) INJECTION 76% COMPARISON:  01/21/2018 chest radiograph, 12/26/2017 chest CT and prior studies FINDINGS: Cardiovascular: This is a technically adequate study but respiratory motion artifact decreases sensitivity, specially in the mid and LOWER lungs. No pulmonary emboli are identified. Mild cardiomegaly noted. Aortic valvular, coronary artery and thoracic aortic calcifications again noted. There is no evidence of thoracic aortic aneurysm or pericardial effusion. Mediastinum/Nodes: New RIGHT internal mammary lymph nodes are identified, the largest measuring 8 mm in short axis) series 5: Image 75). No other abnormal lymph nodes identified. No mediastinal mass.  The visualized thyroid gland is unremarkable. Lungs/Pleura: A large RIGHT pleural effusion has increased size and 12/26/2017. Pleural/chest wall nodules are now better visualized with index posterior nodule measuring 1.5 x 2.5 cm (5:90). 5 mm nodules within the RIGHT middle lobe (6: 72 and 79) and SUPERIOR segment LEFT LOWER lobe (6:36) are identified. RIGHT mid-LOWER lung atelectasis and minimal LEFT basilar atelectasis/scarring again noted. There is no evidence of pneumothorax. Upper Abdomen: A new 2 x 2.3 cm RIGHT adrenal nodule is noted. Low-density lesion within the RIGHT liver is unchanged. Musculoskeletal: No acute or definite focal bony lesions identified. Review of the MIP images confirms the above findings. IMPRESSION: 1. Large RIGHT pleural effusion with RIGHT pleural/chest wall nodules. New RIGHT internal mammary lymphadenopathy, small pulmonary nodules and 2.3 cm RIGHT adrenal nodule are highly suspicious for metastatic disease. 2. No evidence of pulmonary  emboli 3. Mild cardiomegaly and aortic valvular calcifications. 4. Coronary artery and Aortic Atherosclerosis (ICD10-I70.0). Electronically Signed   By: Margarette Canada M.D.   On: 01/21/2018 14:55     EKG: Independently reviewed.  Sinus rhythm, tachycardia, QTC 483, bifascicular block  Assessment/Plan Principal Problem:   Recurrent right pleural effusion Active Problems:   Essential  hypertension   GERD   Acute respiratory failure with hypoxia (HCC)   PAF (paroxysmal atrial fibrillation) (HCC)   Clear cell carcinoma of kidney, left ([facility])   Anxiety   CAD (coronary artery disease)   Iron deficiency anemia   Elevated troponin   CKD (chronic kidney disease), stage III ([facility])   Acute respiratory failure with hypoxia due to recurrent right pleural effusion: CTA negative for PE. Pt was started with vancomycin and cefepime in ED due to concerning for pneumonia, but patient does not have fever or leukocytosis, clinically does not seem to have pneumonia.  Patient is being treated with doxycycline by PCP, will d/c IV abx and continue doxycycline.  Patient will need thoracentesis.  Currently patient has oxygen saturation 98% on 2 L nasal cannula oxygen.  Respiration rate 25-35.  Hemodynamically stable.  -will place on tele bed for obs -continu nasal cannula oxygen to maintain oxygen saturation above 93%e  -PRN Xopenex for shortness of breath, Mucinex for cough -Continue doxycycline -follow-up blood culture -Please call IR for thoracentesis in the morning.  Essential hypertension:  -continue Metoprolol -Patient is also on Flomax- -IV hydralazine as needed  GERD: -Protonix  Atrial Fibrillation: CHA2DS2-VASc Score is 4, needs oral anticoagulation. Patient was on Eliquis which was d/c'ed since recent thoracentesis.  Heart rate is 110s. -continue Dronedarone and metoprolol -if HR is upto >125 persistently, will start cardizem gtt  Clear cell carcinoma of kidney, left [facility]): Patient is s/p of left  Nephrectomy.  Patient is followed up by Dr. Marin Olp. He was treated with Sutent, but could not tolerate it, and stopped. CTA today showed possible metastasized to disease. -f/u with Dr. Marin Olp.  -I will put Dr. Antonieta Pert name on treatment team, hoping he will see pt in am.  CAD: no CP -ASA was recently stopped -on metoprolol  Anxiety: -continue prn Valium  Iron deficiency anemia: Hgb 10.1 -f/u by CBC  CKD (chronic kidney disease), stage III: stable.  Baseline creatinine 1.5-1.8.  His creatinine is 1.53, BUN 23.  -F/u by BMP  Elevated troponin: very minimally elevated, 0.03. No CP.  Likely nonspecific elevation or mild demand ischemia. -check troponin x3 -Check A1c, FLP -Repeat EKG in the morning   DVT ppx: SCD Code Status: Full code (I discussed with patient in the presence of her daughter, explained the meaning of [place], he wants to be full code) Family Communication:  Yes, patient's  daughter at bed side Disposition Plan:  Anticipate discharge back to previous home environment Consults called:  none Admission status: SDU/obs  Date of Service 01/22/2018    Ivor Costa [facility] Pager [phone]  If 7PM-7AM, please contact night-coverage [email or web address] Password [email or web address] 01/22/2018, 1:10 AM

## 2018-01-21 NOTE — ED Provider Notes (Signed)
[facility] Provider Note   CSN: [id number] Arrival date & time: 01/21/18  1059     History   Chief Complaint Chief Complaint  Patient presents with  . Hypotension    HPI Barry Curry is a 77 y.o. male.  77 year old male with extensive past medical history including renal clear cell carcinoma s/p nephrectomy, CKD, CAD, anxiety/depression, aortic stenosis who p/w shortness of breath and fatigue.  The patient was admitted to [facility] last month for shortness of breath and he was found to have a pleural effusion.  He had a thoracentesis and the fluid was found to be blood.  Daughter notes that he had some improved ability to take a deep breath but he continued to have dyspnea even after the thoracentesis.  He was eventually discharged home off of his usual Eliquis and aspirin.  He saw his oncologist, Dr. Marin Olp, who also held Sutent.  He was seen by PCP earlier this week because his shortness of breath worsened and he began coughing.  They diagnosed him with pneumonia and started him on doxycycline.  Daughter states that he has had 3 doses of the medication but no improvement in his symptoms.  His main complaint is severe generalized fatigue.  He states that when he wakes up in the morning he barely has enough energy to put his socks on.  He denies any associated chest pain.  He has had no fevers, vomiting, diarrhea, or abdominal pain.  No urinary symptoms.  He was seen at his cardiologist office today and noted to be hypotensive, sent here for further evaluation.   The history is provided by the patient and a relative.    Past Medical History:  Diagnosis Date  . Anemia    history of  . Anxiety   . Aortic stenosis   . Arthritis   . Cancer ([place])    Melanoma back of neck  . Cancer of kidney ([place])   . Cardiomyopathy ([place])   . Clear cell carcinoma of kidney, left ([place]) 11/17/2017  . Complication of anesthesia    violent wake up from  anesthesia  . Coronary artery disease   . Depression   . Dyspnea   . Esophageal stricture   . Goals of care, counseling/discussion 11/17/2017  . Heart murmur   . History of atrial fibrillation   . History of claustrophobia   . Hyperlipemia   . Hypertension   . Left kidney mass   . Lower extremity edema   . Mitral stenosis   . PVC (premature ventricular contraction)   . RBBB (right bundle branch block with left anterior fascicular block)     Patient Active Problem List   Diagnosis Date Noted  . Clear cell carcinoma of kidney, left ([place]) 11/17/2017  . Goals of care, counseling/discussion 11/17/2017  . Kidney mass 07/23/2017  . Acute respiratory failure with hypoxia ([place]) 07/23/2017  . Subcutaneous emphysema ([place]) 07/23/2017  . PAF (paroxysmal atrial fibrillation) ([place]) 07/23/2017  . History of pulmonary embolism 07/23/2017  . Essential hypertension 03/02/2007  . GERD 03/02/2007    Past Surgical History:  Procedure Laterality Date  . CARDIOVERSION    . COLONOSCOPY    . ESOPHAGEAL DILATION    . KNEE ARTHROSCOPY Left   . KNEE ARTHROSCOPY Right   . ROBOT ASSISTED LAPAROSCOPIC NEPHRECTOMY Left 07/23/2017   Procedure: XI ROBOTIC ASSISTED LAPAROSCOPIC NEPHRECTOMY;  Surgeon: Alexis Frock, MD;  Location: [facility];  Service: Urology;  Laterality: Left;  .  SHOULDER ARTHROSCOPY N/A   . TOTAL HIP ARTHROPLASTY Left   . TOTAL HIP ARTHROPLASTY Right   . TOTAL KNEE ARTHROPLASTY Left   . TOTAL KNEE ARTHROPLASTY Right   . TOTAL SHOULDER REPLACEMENT Left   . TOTAL SHOULDER REPLACEMENT Right   . UPPER GI ENDOSCOPY          Home Medications    Prior to Admission medications   Medication Sig Start Date End Date Taking? Authorizing Provider  acetaminophen (TYLENOL) 500 MG tablet Take 500 mg by mouth 2 (two) times daily.    [provider]  diazepam (VALIUM) 5 MG tablet Give one hour prior to exam. 09/01/17   Volanda Napoleon, MD  diclofenac sodium (VOLTAREN) 1 % GEL Apply 4 g  topically. 05/26/10   [provider]  Dronedarone HCl (MULTAQ PO) Take by mouth.    [provider]  Ferrous Sulfate Dried (FERROUS SULFATE CR PO) Take by mouth daily.    [provider]  fluticasone (FLONASE) 50 MCG/ACT nasal spray Place 2 sprays into both nostrils at bedtime.    [provider]  IRON PO Take by mouth.    [provider]  lipase/protease/amylase (CREON) 36000 UNITS CPEP capsule Take 2 capsules (72,000 Units total) by mouth 2 (two) times daily before lunch and supper. 01/12/18   Volanda Napoleon, MD  loperamide (IMODIUM) 2 MG capsule Take 2 mg by mouth as needed.     [provider]  LORazepam (ATIVAN) 0.5 MG tablet Take 0.5 mg by mouth every 8 (eight) hours as needed for anxiety.    [provider]  METOPROLOL SUCCINATE PO Take by mouth.    [provider]  pantoprazole (PROTONIX) 40 MG tablet Take 40 mg by mouth daily.    [provider]  tamsulosin (FLOMAX) 0.4 MG CAPS capsule Take 0.4 mg by mouth daily after supper.    [provider]  vitamin B-6 (PYRIDOXINE) 25 MG tablet Take 25 mg by mouth 2 (two) times daily.    [provider]    Family History No family history on file.  Social History Social History   Tobacco Use  . Smoking status: Former Smoker    Packs/day: 0.20    Years: 2.00    Pack years: 0.40    Types: Cigarettes    Last attempt to quit: 07/16/1971    Years since quitting: 46.5  . Smokeless tobacco: Never Used  Substance Use Topics  . Alcohol use: Yes    Comment: daily Scotch  . Drug use: No     Allergies   Penicillins   Review of Systems Review of Systems All other systems reviewed and are negative except that which was mentioned in HPI   Physical Exam Updated Vital Signs BP 107/83   Pulse 71   Temp 98.2 F (36.8 C) (Oral)   Resp (!) 38   Ht 6' (1.829 m)   Wt 92.1 kg (203 lb)   SpO2 100%   BMI 27.53 kg/m   Physical Exam    Constitutional: He is oriented to person, place, and time. He appears well-developed. No distress.  Frail, chronically ill appearing elderly man  HENT:  Head: Normocephalic and atraumatic.  Moist mucous membranes  Eyes: Pupils are equal, round, and reactive to light. Conjunctivae are normal.  Neck: Neck supple.  Cardiovascular: Normal rate, regular rhythm and normal heart sounds.  No murmur heard. Pulmonary/Chest:  Tachypnea and mildly increased WOB, no wheezing, diminished in RLL, no  rales  Abdominal: Soft. Bowel sounds are normal. He exhibits no distension. There is no tenderness.  Musculoskeletal: He exhibits edema (mild BLE).  Neurological: He is alert and oriented to person, place, and time.  Fluent speech  Skin: Skin is warm and dry. There is pallor.  Psychiatric:  Depressed mood  Nursing note and vitals reviewed.    ED Treatments / Results  Labs (all labs ordered are listed, but only abnormal results are displayed) Labs Reviewed  COMPREHENSIVE METABOLIC PANEL - Abnormal; Notable for the following components:      Result Value   Sodium 134 (*)    Glucose, Bld 117 (*)    Creatinine, Ser 1.53 (*)    Calcium 8.6 (*)    Albumin 3.3 (*)    GFR calc non Af Amer 42 (*)    GFR calc Af Amer 49 (*)    All other components within normal limits  CBC WITH DIFFERENTIAL/PLATELET - Abnormal; Notable for the following components:   RBC 3.64 (*)    Hemoglobin 10.1 (*)    HCT 30.8 (*)    RDW 18.3 (*)    Neutro Abs 8.0 (*)    Monocytes Absolute 1.2 (*)    All other components within normal limits  URINALYSIS, ROUTINE W REFLEX MICROSCOPIC - Abnormal; Notable for the following components:   Specific Gravity, Urine <1.005 (*)    All other components within normal limits  TROPONIN I - Abnormal; Notable for the following components:   Troponin I 0.03 (*)    All other components within normal limits  CULTURE, BLOOD (ROUTINE X 2)  CULTURE, BLOOD (ROUTINE X 2)  URINE CULTURE   PROTIME-INR  BRAIN NATRIURETIC PEPTIDE  I-STAT CG4 LACTIC ACID, ED    EKG None  Radiology Dg Chest 2 View  Result Date: 01/21/2018 CLINICAL DATA:  77 year old male with recent pneumonia. Hypotension and weakness. History of renal cell carcinoma. EXAM: CHEST - 2 VIEW COMPARISON:  Chest radiograph 01/19/2018 and earlier. Chest CTA 12/26/2017. FINDINGS: Persistent small right pleural effusion with fluid tracking into the right minor fissure. Stable lung volumes. Mediastinal contours remain normal. Visualized tracheal air column is within normal limits. Stable left chest cardiac pacemaker. No pneumothorax, pulmonary edema, consolidation, or new pulmonary opacity. Stable visualized osseous structures. Bilateral humerus arthroplasty. Negative visible bowel gas pattern. IMPRESSION: 1. Persistent small right pleural effusion with fluid tracking into the minor fissure. 2. No other acute cardiopulmonary abnormality. Electronically Signed   By: Genevie Ann M.D.   On: 01/21/2018 12:33   Ct Angio Chest Pe W/cm &/or Wo Cm  Result Date: 01/21/2018 CLINICAL DATA:  77 year old male with acute shortness of breath and hypotension today. History of LEFT renal carcinoma and LEFT nephrectomy in 2019. EXAM: CT ANGIOGRAPHY CHEST WITH CONTRAST TECHNIQUE: Multidetector CT imaging of the chest was performed using the standard protocol during bolus administration of intravenous contrast. Multiplanar CT image reconstructions and MIPs were obtained to evaluate the vascular anatomy. CONTRAST:  33mL ISOVUE-370 IOPAMIDOL (ISOVUE-370) INJECTION 76% COMPARISON:  01/21/2018 chest radiograph, 12/26/2017 chest CT and prior studies FINDINGS: Cardiovascular: This is a technically adequate study but respiratory motion artifact decreases sensitivity, specially in the mid and LOWER lungs. No pulmonary emboli are identified. Mild cardiomegaly noted. Aortic valvular, coronary artery and thoracic aortic calcifications again noted. There is no  evidence of thoracic aortic aneurysm or pericardial effusion. Mediastinum/Nodes: New RIGHT internal mammary lymph nodes are identified, the largest measuring 8 mm in short axis) series 5: Image 75). No other  abnormal lymph nodes identified. No mediastinal mass.  The visualized thyroid gland is unremarkable. Lungs/Pleura: A large RIGHT pleural effusion has increased size and 12/26/2017. Pleural/chest wall nodules are now better visualized with index posterior nodule measuring 1.5 x 2.5 cm (5:90). 5 mm nodules within the RIGHT middle lobe (6: 72 and 79) and SUPERIOR segment LEFT LOWER lobe (6:36) are identified. RIGHT mid-LOWER lung atelectasis and minimal LEFT basilar atelectasis/scarring again noted. There is no evidence of pneumothorax. Upper Abdomen: A new 2 x 2.3 cm RIGHT adrenal nodule is noted. Low-density lesion within the RIGHT liver is unchanged. Musculoskeletal: No acute or definite focal bony lesions identified. Review of the MIP images confirms the above findings. IMPRESSION: 1. Large RIGHT pleural effusion with RIGHT pleural/chest wall nodules. New RIGHT internal mammary lymphadenopathy, small pulmonary nodules and 2.3 cm RIGHT adrenal nodule are highly suspicious for metastatic disease. 2. No evidence of pulmonary emboli 3. Mild cardiomegaly and aortic valvular calcifications. 4. Coronary artery and Aortic Atherosclerosis (ICD10-I70.0). Electronically Signed   By: Margarette Canada M.D.   On: 01/21/2018 14:55    Procedures .Critical Care Performed by: Sharlett Iles, MD Authorized by: Sharlett Iles, MD   Critical care provider statement:    Critical care time (minutes):  30   Critical care time was exclusive of:  Separately billable procedures and treating other patients   Critical care was necessary to treat or prevent imminent or life-threatening deterioration of the following conditions:  Respiratory failure   Critical care was time spent personally by me on the following  activities:  Development of treatment plan with patient or surrogate, evaluation of patient's response to treatment, examination of patient, obtaining history from patient or surrogate, ordering and performing treatments and interventions, ordering and review of laboratory studies, ordering and review of radiographic studies, re-evaluation of patient's condition and review of old charts   (including critical care time)  Medications Ordered in ED Medications  ceFEPIme (MAXIPIME) 1 g injection (has no administration in time range)  vancomycin (VANCOCIN) 1,250 mg in sodium chloride 0.9 % 250 mL IVPB (has no administration in time range)  vancomycin (VANCOCIN) 500 MG powder (has no administration in time range)  ceFEPIme (MAXIPIME) 1 g in sodium chloride 0.9 % 100 mL IVPB ( Intravenous Stopped 01/21/18 1240)  sodium chloride 0.9 % bolus 1,000 mL (1,000 mLs Intravenous IV Pump Association 01/21/18 1158)  vancomycin (VANCOCIN) 1,500 mg in sodium chloride 0.9 % 500 mL IVPB (1,500 mg Intravenous New Bag/Given 01/21/18 1313)  iopamidol (ISOVUE-370) 76 % injection 100 mL (80 mLs Intravenous Contrast Given 01/21/18 1352)  LORazepam (ATIVAN) injection 0.5 mg (0.5 mg Intravenous Given 01/21/18 1410)     Initial Impression / Assessment and Plan / ED Course  I have reviewed the triage vital signs and the nursing notes.  Pertinent labs & imaging results that were available during my care of the patient were reviewed by me and considered in my medical decision making (see chart for details).    Pt was tonically ill-appearing on exam, mild hypotension, afebrile.  His respiratory rate was in the 20s and he appeared dyspneic but without any wheezes or rales.  Obtained labs including blood and urine cultures.  Lactate normal.  CXR shows R pleural effusion. Because he has been on doxycycline and has had worsening sx w/ now hypotension, gave HCAP coverage w/ Vanc and cefepime.   His lab work is overall stable from  previous with creatinine 1.5, hemoglobin 10.1.  Normal WBC count. trop  0.03.  Because of his tachypnea on exam without severe findings on CXR or hypoxia, obtained CTA chest which shows large right pleural effusion with nodules and lymphadenopathy, concerning for metastatic disease.  I suspect that he is symptomatic from this pleural effusion and will benefit from thoracentesis.  I had a long discussion with daughter who was upset at his progressive decline recently.  I discussed that it may be worth speaking to palliative care to establish his own goals of care. Discussed admission with hospitalist at [facility], Dr. Ree Kida, who accepted the patient in transfer.  He will be admitted to stepdown. Final Clinical Impressions(s) / ED Diagnoses   Final diagnoses:  None    ED Discharge Orders    None       Janith Nielson, Wenda Overland, MD 01/21/18 1539

## 2018-01-22 ENCOUNTER — Inpatient Hospital Stay (HOSPITAL_COMMUNITY): Payer: Medicare Other

## 2018-01-22 ENCOUNTER — Observation Stay (HOSPITAL_COMMUNITY): Payer: Medicare Other

## 2018-01-22 DIAGNOSIS — C782 Secondary malignant neoplasm of pleura: Secondary | ICD-10-CM | POA: Diagnosis present

## 2018-01-22 DIAGNOSIS — N183 Chronic kidney disease, stage 3 (moderate): Secondary | ICD-10-CM | POA: Diagnosis not present

## 2018-01-22 DIAGNOSIS — E440 Moderate protein-calorie malnutrition: Secondary | ICD-10-CM | POA: Diagnosis present

## 2018-01-22 DIAGNOSIS — Z950 Presence of cardiac pacemaker: Secondary | ICD-10-CM | POA: Diagnosis not present

## 2018-01-22 DIAGNOSIS — I361 Nonrheumatic tricuspid (valve) insufficiency: Secondary | ICD-10-CM | POA: Diagnosis not present

## 2018-01-22 DIAGNOSIS — J90 Pleural effusion, not elsewhere classified: Secondary | ICD-10-CM | POA: Diagnosis not present

## 2018-01-22 DIAGNOSIS — Z96612 Presence of left artificial shoulder joint: Secondary | ICD-10-CM | POA: Diagnosis present

## 2018-01-22 DIAGNOSIS — C799 Secondary malignant neoplasm of unspecified site: Secondary | ICD-10-CM | POA: Diagnosis not present

## 2018-01-22 DIAGNOSIS — Z96653 Presence of artificial knee joint, bilateral: Secondary | ICD-10-CM | POA: Diagnosis present

## 2018-01-22 DIAGNOSIS — R000 Tachycardia, unspecified: Secondary | ICD-10-CM | POA: Diagnosis not present

## 2018-01-22 DIAGNOSIS — I429 Cardiomyopathy, unspecified: Secondary | ICD-10-CM | POA: Diagnosis present

## 2018-01-22 DIAGNOSIS — Z905 Acquired absence of kidney: Secondary | ICD-10-CM | POA: Diagnosis not present

## 2018-01-22 DIAGNOSIS — I499 Cardiac arrhythmia, unspecified: Secondary | ICD-10-CM | POA: Diagnosis not present

## 2018-01-22 DIAGNOSIS — C649 Malignant neoplasm of unspecified kidney, except renal pelvis: Secondary | ICD-10-CM | POA: Diagnosis not present

## 2018-01-22 DIAGNOSIS — I480 Paroxysmal atrial fibrillation: Secondary | ICD-10-CM | POA: Diagnosis present

## 2018-01-22 DIAGNOSIS — D509 Iron deficiency anemia, unspecified: Secondary | ICD-10-CM | POA: Diagnosis present

## 2018-01-22 DIAGNOSIS — Z87891 Personal history of nicotine dependence: Secondary | ICD-10-CM | POA: Diagnosis not present

## 2018-01-22 DIAGNOSIS — I959 Hypotension, unspecified: Secondary | ICD-10-CM | POA: Diagnosis present

## 2018-01-22 DIAGNOSIS — N289 Disorder of kidney and ureter, unspecified: Secondary | ICD-10-CM | POA: Diagnosis not present

## 2018-01-22 DIAGNOSIS — E611 Iron deficiency: Secondary | ICD-10-CM | POA: Diagnosis not present

## 2018-01-22 DIAGNOSIS — I700 Atherosclerosis of aorta: Secondary | ICD-10-CM | POA: Diagnosis present

## 2018-01-22 DIAGNOSIS — Z96611 Presence of right artificial shoulder joint: Secondary | ICD-10-CM | POA: Diagnosis present

## 2018-01-22 DIAGNOSIS — Z833 Family history of diabetes mellitus: Secondary | ICD-10-CM | POA: Diagnosis not present

## 2018-01-22 DIAGNOSIS — Z79899 Other long term (current) drug therapy: Secondary | ICD-10-CM | POA: Diagnosis not present

## 2018-01-22 DIAGNOSIS — N179 Acute kidney failure, unspecified: Secondary | ICD-10-CM | POA: Diagnosis present

## 2018-01-22 DIAGNOSIS — J9601 Acute respiratory failure with hypoxia: Secondary | ICD-10-CM | POA: Diagnosis present

## 2018-01-22 DIAGNOSIS — F419 Anxiety disorder, unspecified: Secondary | ICD-10-CM | POA: Diagnosis not present

## 2018-01-22 DIAGNOSIS — R918 Other nonspecific abnormal finding of lung field: Secondary | ICD-10-CM | POA: Diagnosis not present

## 2018-01-22 DIAGNOSIS — C642 Malignant neoplasm of left kidney, except renal pelvis: Secondary | ICD-10-CM | POA: Diagnosis not present

## 2018-01-22 DIAGNOSIS — R0600 Dyspnea, unspecified: Secondary | ICD-10-CM

## 2018-01-22 DIAGNOSIS — F329 Major depressive disorder, single episode, unspecified: Secondary | ICD-10-CM | POA: Diagnosis present

## 2018-01-22 DIAGNOSIS — I2510 Atherosclerotic heart disease of native coronary artery without angina pectoris: Secondary | ICD-10-CM | POA: Diagnosis present

## 2018-01-22 DIAGNOSIS — Z96643 Presence of artificial hip joint, bilateral: Secondary | ICD-10-CM | POA: Diagnosis present

## 2018-01-22 DIAGNOSIS — I10 Essential (primary) hypertension: Secondary | ICD-10-CM | POA: Diagnosis not present

## 2018-01-22 DIAGNOSIS — D62 Acute posthemorrhagic anemia: Secondary | ICD-10-CM | POA: Diagnosis present

## 2018-01-22 DIAGNOSIS — Z85528 Personal history of other malignant neoplasm of kidney: Secondary | ICD-10-CM | POA: Diagnosis not present

## 2018-01-22 DIAGNOSIS — R531 Weakness: Secondary | ICD-10-CM | POA: Diagnosis present

## 2018-01-22 DIAGNOSIS — J910 Malignant pleural effusion: Secondary | ICD-10-CM | POA: Diagnosis present

## 2018-01-22 DIAGNOSIS — E785 Hyperlipidemia, unspecified: Secondary | ICD-10-CM | POA: Diagnosis present

## 2018-01-22 DIAGNOSIS — D649 Anemia, unspecified: Secondary | ICD-10-CM | POA: Diagnosis not present

## 2018-01-22 LAB — CBC
HCT: 29.3 % — ABNORMAL LOW (ref 39.0–52.0)
Hemoglobin: 9.1 g/dL — ABNORMAL LOW (ref 13.0–17.0)
MCH: 27.1 pg (ref 26.0–34.0)
MCHC: 31.1 g/dL (ref 30.0–36.0)
MCV: 87.2 fL (ref 78.0–100.0)
Platelets: 151 10*3/uL (ref 150–400)
RBC: 3.36 MIL/uL — ABNORMAL LOW (ref 4.22–5.81)
RDW: 18.6 % — ABNORMAL HIGH (ref 11.5–15.5)
WBC: 10 10*3/uL (ref 4.0–10.5)

## 2018-01-22 LAB — BASIC METABOLIC PANEL
Anion gap: 10 (ref 5–15)
BUN: 20 mg/dL (ref 8–23)
CO2: 21 mmol/L — AB (ref 22–32)
Calcium: 8.5 mg/dL — ABNORMAL LOW (ref 8.9–10.3)
Chloride: 108 mmol/L (ref 98–111)
Creatinine, Ser: 1.32 mg/dL — ABNORMAL HIGH (ref 0.61–1.24)
GFR calc non Af Amer: 50 mL/min — ABNORMAL LOW (ref >60)
GFR, EST AFRICAN AMERICAN: 58 mL/min — AB (ref >60)
Glucose, Bld: 101 mg/dL — ABNORMAL HIGH (ref 70–99)
Potassium: 4.3 mmol/L (ref 3.5–5.1)
SODIUM: 139 mmol/L (ref 135–145)

## 2018-01-22 LAB — GRAM STAIN

## 2018-01-22 LAB — BODY FLUID CELL COUNT WITH DIFFERENTIAL
Eos, Fluid: 1 %
LYMPHS FL: 10 %
Monocyte-Macrophage-Serous Fluid: 0 % — ABNORMAL LOW (ref 50–90)
Neutrophil Count, Fluid: 89 % — ABNORMAL HIGH (ref 0–25)
Total Nucleated Cell Count, Fluid: 567 cu mm (ref 0–1000)

## 2018-01-22 LAB — HEMOGLOBIN A1C
HEMOGLOBIN A1C: 5.4 % (ref 4.8–5.6)
MEAN PLASMA GLUCOSE: 108.28 mg/dL

## 2018-01-22 LAB — GLUCOSE, PLEURAL OR PERITONEAL FLUID: GLUCOSE FL: 62 mg/dL

## 2018-01-22 LAB — LIPID PANEL
CHOLESTEROL: 131 mg/dL (ref 0–200)
HDL: 31 mg/dL — AB (ref >40)
LDL Cholesterol: 77 mg/dL (ref 0–99)
Total CHOL/HDL Ratio: 4.2 RATIO
Triglycerides: 116 mg/dL (ref <150)
VLDL: 23 mg/dL (ref 0–40)

## 2018-01-22 LAB — TROPONIN I
Troponin I: 0.03 ng/mL (ref <0.03)
Troponin I: <0.03 ng/mL (ref <0.03)

## 2018-01-22 LAB — LACTATE DEHYDROGENASE, PLEURAL OR PERITONEAL FLUID: LD, Fluid: 201 U/L — ABNORMAL HIGH (ref 3–23)

## 2018-01-22 LAB — LACTATE DEHYDROGENASE: LDH: 149 U/L (ref 98–192)

## 2018-01-22 MED ORDER — ENSURE ENLIVE PO LIQD
237.0000 mL | Freq: Two times a day (BID) | ORAL | Status: DC
  Administered 2018-01-23 – 2018-02-02 (×14): 237 mL via ORAL

## 2018-01-22 MED ORDER — LIDOCAINE HCL (PF) 1 % IJ SOLN
INTRAMUSCULAR | Status: AC
  Filled 2018-01-22: qty 30

## 2018-01-22 NOTE — Progress Notes (Signed)
Pt's daughter expressed concern/frustration regarding delay in thoracentesis plans.  Spoke to IR who stated there were no active orders.    Notified physician who spoke to the pt's daughter, [name], and orders were placed.   1410 - pt has gone down to IR via w/c.

## 2018-01-22 NOTE — Progress Notes (Signed)
Initial Nutrition Assessment  DOCUMENTATION CODES:  Non-severe (moderate) malnutrition in context of chronic illness  INTERVENTION:  Ensure Enlive po BID, each supplement provides 350 kcal and 20 grams of protein  Gave education regarding hydration and increasing energy/protein intake.   NUTRITION DIAGNOSIS:  Moderate Malnutrition related to chronic illness(CKD, CAD, likely cancer recurrence) as evidenced by mild muscle/fat loss  GOAL:  Patient will meet greater than or equal to 90% of their needs  MONITOR:  PO intake, Supplement acceptance, Labs, Weight trends  REASON FOR ASSESSMENT:  Consult Assessment of nutrition requirement/status  ASSESSMENT:  77 y/o male PMHx HTN/HLD, GERD, Depression, Anxiety, Afib, CAD, PVC, AS, RBB, IDA, CKD, RCC S/p L nephrectomy. Presents with SOB. Work up revealed R pleural effusion and new R mammary lymphadenopathy, pulm nodules, R adrenal nodules suspicious for metastatic diease. Admitted for thoracentsis and management of resp failure.   Patient reports that he has a chronic, persisting lack of appetite. This is not new. It has gone on for about 6 months. His daughters are his care providers. THey make him large meals, but he only eats small portions. He does supplement his diet with Ensure. He takes a multivitamin.   He denies any n/v/c. Oddly, he says he had "mucous-like" diarrhea a few weeks ago when he wasn't eating, but then this went away when he began eating. He notes he was recently started on creon, for pancreatic insufficiency? (says he tolerates ensure supplements extremely well though they have extremely high fat content).   He reports a UBW of ~ 220 lbs. Per chart, he was 221 in January. He lost a large amount of weight from January to February and has lost a pound or two each month since then. His daughter feels he has lost more then his current wt of 203 reflects. She feels he is UNder 198 lbs. With documentation at hand, he has not lost a  clinically sig amount of wt.   The majority of our conversation revolved around hydration. Daughter notes the patient has been at various hospital 8x in the past 6 months, and a common factor in each of these encounters was dehydration. Pt drinks "at most" 3 16.9 oz bottles of water/day. The rest of the time he drinks juice and diet soda. Patient says if he drinks too much water he will start getting sick. He does not feel this way when he drinks any other beverages.   RD reviewed what drinks are the best vs worst for hydration. Recommended avoiding juice/reg soda. Diet soda is sub optimal. Recommended milk, water, G2, carbonated/flavored water and ORS.    Briefly discussed a few other diet changes that will help him maintain weight.   Labs: H/H:9.1/29.3, A1C: 5.4, Albumin: 3.3, Creat: 1.52->1.32 Meds: Oral Abx, Iron, Creon, PPI, B6,   Recent Labs  Lab 01/21/18 1142 01/22/18 0401  NA 134* 139  K 4.5 4.3  CL 104 108  CO2 24 21*  BUN 23 20  CREATININE 1.53* 1.32*  CALCIUM 8.6* 8.5*  GLUCOSE 117* 101*   NUTRITION - FOCUSED PHYSICAL EXAM:   Most Recent Value  Orbital Region  Mild depletion  Upper Arm Region  No depletion  Thoracic and Lumbar Region  Mild depletion  Buccal Region  Mild depletion  Temple Region  Mild depletion  Clavicle Bone Region  Mild depletion  Clavicle and Acromion Bone Region  Mild depletion  Scapular Bone Region  No depletion  Dorsal Hand  No depletion  Patellar Region  No depletion  Anterior Thigh Region  No depletion  Posterior Calf Region  No depletion  Edema (RD Assessment)  None     Diet Order:   Diet Order           Diet Heart Room service appropriate? Yes; Fluid consistency: Thin  Diet effective now         EDUCATION NEEDS:  Education needs have been addressed  Skin:  Skin Assessment: Reviewed RN Assessment  Last BM:  7/25  Height:  Ht Readings from Last 1 Encounters:  01/21/18 6' (1.829 m)   Weight:  Wt Readings from Last 1  Encounters:  01/21/18 203 lb (92.1 kg)   Wt Readings from Last 10 Encounters:  01/21/18 203 lb (92.1 kg)  01/12/18 203 lb 8 oz (92.3 kg)  12/15/17 205 lb (93 kg)  11/17/17 206 lb 6.4 oz (93.6 kg)  09/22/17 208 lb 8 oz (94.6 kg)  08/25/17 207 lb 8 oz (94.1 kg)  07/23/17 221 lb 1.9 oz (100.3 kg)  07/19/17 221 lb 4 oz (100.4 kg)  07/15/12 237 lb (107.5 kg)   Ideal Body Weight:  80.91 kg  BMI:  Body mass index is 27.53 kg/m.  Estimated Nutritional Needs:  Kcal:  2050-2300 (22-25 kcal/kg bw) Protein:  97-113g Pro (1.2-1.4g/kg bw) Fluid:  >2 L/day (1 ml/kcal)  Burtis Junes RD, LDN, CNSC Clinical Nutrition Available Tues-Sat via Pager: [phone] 01/22/2018 5:34 PM

## 2018-01-22 NOTE — Progress Notes (Addendum)
PROGRESS NOTE  Barry Curry ZDG:[id number] DOB: 07/10/1940 DOA: 01/21/2018 PCP: [name], PA-C  HPI/Recap of past 24 hours: Barry Curry is a 77 y.o. male with medical history significant of hypertension, hyperlipidemia, GERD, depression, anxiety, atrial fibrillation not on anticoagulants, CAD, PVC, aortic stenosis, right bundle blockage, iron deficiency anemia, CKD, clear renal cell cancer (left nephrectomy), melanoma, who presents with shortness of breath.  Per patient wife he had a right thoracentesis done 3 weeks ago [facility].  01/22/2018: Patient seen and examined with his wife at bedside.  He is alert however is uncomfortable due to dyspnea.  Denies chest pain or palpitations.  Ultrasound-guided right thoracentesis ordered to be completed today     Assessment/Plan: Principal Problem:   Recurrent right pleural effusion Active Problems:   Essential hypertension   GERD   Acute respiratory failure with hypoxia (HCC)   PAF (paroxysmal atrial fibrillation) (HCC)   Clear cell carcinoma of kidney, left (HCC)   Anxiety   CAD (coronary artery disease)   Iron deficiency anemia   Elevated troponin   CKD (chronic kidney disease), stage III (HCC)  Acute hypoxic respiratory failure most likely secondary to recurrent right pleural effusion Per his wife he had a right thoracentesis done 3 weeks ago at [facility] Continue O2 supplementation to maintain O2 saturation greater than 92%  Recurrent right pleural effusion Ultrasound-guided right thoracentesis ordered to be completed today Body fluid Gram stain, cytology, and cultures ordered Obtain chest x-ray post thoracentesis  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 doxycycline  DVT prophylaxis: Subcu Lovenox daily   Objective: Vitals:   01/22/18 0714 01/22/18 0739 01/22/18 1208 01/22/18 1310  BP: 115/74  118/76   Pulse: 82  95   Resp: (!) 27  (!) 22   Temp: 97.7 F (36.5 C)  98.4 F (36.9 C)   TempSrc: Oral  Oral   SpO2: 100% 98% 100% 95%  Weight:      Height:        Intake/Output Summary (Last 24 hours) at 01/22/2018 1351 Last data filed at 01/22/2018 0800 Gross per 24 hour  Intake 1612.87 ml  Output -  Net 1612.87 ml   Filed Weights   01/21/18 1107  Weight: 92.1 kg (203 lb)    Exam:  . General: 77 y.o. year-old male well developed well nourished. Appears uncomfortable due to dyspnea.  Alert and oriented x3. . Cardiovascular: Regular rate and rhythm with no rubs or gallops.  No thyromegaly or JVD noted.   Marland Kitchen Respiratory: Rales diffusely and bilaterally. Good inspiratory effort. . Abdomen: Soft nontender nondistended with normal bowel sounds x4 quadrants. . Musculoskeletal: No lower extremity edema. 2/4 pulses in all 4 extremities. . Skin: No ulcerative lesions noted or rashes . Psychiatry: Mood is appropriate for condition and setting   Data Reviewed: CBC: Recent Labs  Lab 01/21/18 1142 01/22/18 0401  WBC 10.2 10.0  NEUTROABS 8.0*  --   HGB 10.1* 9.1*  HCT 30.8* 29.3*  MCV 84.6 87.2  PLT 155 376   Basic Metabolic Panel: Recent Labs  Lab 01/21/18 1142 01/22/18 0401  NA 134* 139  K 4.5 4.3  CL 104 108  CO2 24 21*  GLUCOSE 117* 101*  BUN 23 20  CREATININE 1.53* 1.32*  CALCIUM 8.6* 8.5*   GFR: Estimated Creatinine Clearance: 51.4 mL/min (A)  (by C-G formula based on SCr of 1.32 mg/dL (H)). Liver Function Tests: Recent Labs  Lab 01/21/18 1142  AST 22  ALT 13  ALKPHOS 82  BILITOT 0.4  PROT 6.6  ALBUMIN 3.3*   No results for input(s): LIPASE, AMYLASE in the last 168 hours. No results for input(s): AMMONIA in the last 168 hours. Coagulation Profile: Recent Labs  Lab 01/21/18 1142  INR 1.08   Cardiac Enzymes: Recent Labs  Lab 01/21/18 1142 01/21/18 2225 01/22/18 0401 01/22/18 1001  TROPONINI 0.03* <0.03 0.03* <0.03   BNP (last 3 results) No results for input(s): PROBNP in the last 8760 hours. HbA1C: Recent Labs    01/22/18 0401  HGBA1C 5.4   CBG: No results for input(s): GLUCAP in the last 168 hours. Lipid Profile: Recent Labs    01/22/18 0401  CHOL 131  HDL 31*  LDLCALC 77  TRIG 116  CHOLHDL 4.2   Thyroid Function Tests: No results for input(s): TSH, T4TOTAL, FREET4, T3FREE, THYROIDAB in the last 72 hours. Anemia Panel: No results for input(s): VITAMINB12, FOLATE, FERRITIN, TIBC, IRON, RETICCTPCT in the last 72 hours. Urine analysis:    Component Value Date/Time   COLORURINE YELLOW 01/21/2018 1432   APPEARANCEUR CLEAR 01/21/2018 1432   LABSPEC <1.005 (L) 01/21/2018 1432   PHURINE 5.5 01/21/2018 1432   GLUCOSEU NEGATIVE 01/21/2018 1432   HGBUR NEGATIVE 01/21/2018 1432   BILIRUBINUR NEGATIVE 01/21/2018 1432   KETONESUR NEGATIVE 01/21/2018 1432   PROTEINUR NEGATIVE 01/21/2018 1432   UROBILINOGEN 1.0 03/21/2010 1348   NITRITE NEGATIVE 01/21/2018 1432   LEUKOCYTESUR NEGATIVE 01/21/2018 1432   Sepsis Labs: @LABRCNTIP (procalcitonin:4,lacticidven:4)  ) Recent Results (from the past 240 hour(s))  Culture, blood (Routine x 2)     Status: None (Preliminary result)   Collection Time: 01/21/18 11:40 AM  Result Value Ref Range Status   Specimen Description   Final    BLOOD RIGHT ARM Performed at [facility], [address] [place], [place] [address]    Special Requests   Final      BOTTLES DRAWN AEROBIC AND ANAEROBIC Blood Culture adequate volume Performed at [facility], [address] [place], [place] [address]    Culture   Final    NO GROWTH < 24 HOURS Performed at [facility], [place] [address] [place], [place] [address]    Report Status PENDING  Incomplete  Culture, blood (Routine x 2)     Status: None (Preliminary result)   Collection Time: 01/21/18 11:50 AM  Result Value Ref Range Status   Specimen Description   Final    BLOOD LEFT ARM Performed at [facility], [address] [place], [place] [address]    Special Requests   Final    BOTTLES DRAWN AEROBIC AND ANAEROBIC Blood Culture results may not be optimal due to an inadequate volume of blood received in culture bottles Performed at [facility], [address], [place], [place] [address]    Culture  Final    NO GROWTH < 24 HOURS Performed at [facility], [place] [address] [place], [place] [address]    Report Status PENDING  Incomplete  Urine culture     Status: None (Preliminary result)   Collection Time: 01/21/18  2:32 PM  Result Value Ref Range Status   Specimen Description   Final    URINE, CLEAN CATCH Performed at [facility], [address] [place], [place] [address]    Special Requests   Final    NONE Performed at [facility], [address] [place], [place] [address]    Culture   Final    CULTURE REINCUBATED FOR BETTER GROWTH Performed at [facility], [place] [address] [place], [place] [address]    Report Status PENDING  Incomplete  MRSA PCR Screening     Status: None   Collection Time: 01/21/18  8:47 PM  Result Value Ref Range Status   MRSA by PCR NEGATIVE NEGATIVE Final    Comment:        The GeneXpert MRSA Assay (FDA approved for NASAL specimens only), is one component of a comprehensive MRSA colonization surveillance program. It is not intended to diagnose MRSA infection nor to guide  or monitor treatment for MRSA infections. Performed at [facility], [place] [address], [place], [place] [address]       Studies: Ct Angio Chest Pe W/cm &/or Wo Cm  Result Date: 01/21/2018 CLINICAL DATA:  77 year old male with acute shortness of breath and hypotension today. History of LEFT renal carcinoma and LEFT nephrectomy in 2019. EXAM: CT ANGIOGRAPHY CHEST WITH CONTRAST TECHNIQUE: Multidetector CT imaging of the chest was performed using the standard protocol during bolus administration of intravenous contrast. Multiplanar CT image reconstructions and MIPs were obtained to evaluate the vascular anatomy. CONTRAST:  54mL ISOVUE-370 IOPAMIDOL (ISOVUE-370) INJECTION 76% COMPARISON:  01/21/2018 chest radiograph, 12/26/2017 chest CT and prior studies FINDINGS: Cardiovascular: This is a technically adequate study but respiratory motion artifact decreases sensitivity, specially in the mid and LOWER lungs. No pulmonary emboli are identified. Mild cardiomegaly noted. Aortic valvular, coronary artery and thoracic aortic calcifications again noted. There is no evidence of thoracic aortic aneurysm or pericardial effusion. Mediastinum/Nodes: New RIGHT internal mammary lymph nodes are identified, the largest measuring 8 mm in short axis) series 5: Image 75). No other abnormal lymph nodes identified. No mediastinal mass.  The visualized thyroid gland is unremarkable. Lungs/Pleura: A large RIGHT pleural effusion has increased size and 12/26/2017. Pleural/chest wall nodules are now better visualized with index posterior nodule measuring 1.5 x 2.5 cm (5:90). 5 mm nodules within the RIGHT middle lobe (6: 72 and 79) and SUPERIOR segment LEFT LOWER lobe (6:36) are identified. RIGHT mid-LOWER lung atelectasis and minimal LEFT basilar atelectasis/scarring again noted. There is no evidence of pneumothorax. Upper Abdomen: A new 2 x 2.3 cm RIGHT adrenal nodule is noted. Low-density lesion within the RIGHT liver is  unchanged. Musculoskeletal: No acute or definite focal bony lesions identified. Review of the MIP images confirms the above findings. IMPRESSION: 1. Large RIGHT pleural effusion with RIGHT pleural/chest wall nodules. New RIGHT internal mammary lymphadenopathy, small pulmonary nodules and 2.3 cm RIGHT adrenal nodule are highly suspicious for metastatic disease. 2. No evidence of pulmonary emboli 3. Mild cardiomegaly and aortic valvular calcifications. 4. Coronary artery and Aortic Atherosclerosis (ICD10-I70.0). Electronically Signed   By: Margarette Canada M.D.   On: 01/21/2018 14:55    Scheduled Meds: .  doxycycline  100 mg Oral Q12H  . dronedarone  400 mg Oral BID WC  . ferrous sulfate  325 mg Oral Q breakfast  . fluticasone  2 spray Each Nare QHS  . levalbuterol  1.25 mg Nebulization Q6H  . lipase/protease/amylase  72,000 Units Oral BID AC  . metoprolol succinate  25 mg Oral Daily  . pantoprazole  40 mg Oral Daily  . tamsulosin  0.4 mg Oral QPC supper  . vitamin B-6  25 mg Oral BID    Continuous Infusions:   LOS: 0 days     Kayleen Memos, MD [facility] Pager [phone]  If 7PM-7AM, please contact night-coverage [email or web address] Password [email or web address] 01/22/2018, 1:51 PM

## 2018-01-22 NOTE — Plan of Care (Signed)
Discussed plan of care with patient.  Emphasized the importance of using the call bell when assistance is needed.  Good teach back displayed.

## 2018-01-22 NOTE — Procedures (Signed)
PROCEDURE SUMMARY:  Successful US guided right thoracentesis. Yielded 2 liters of blood. Patient tolerated procedure well. No immediate complications.  Specimen was sent for labs.  Post procedure chest X-ray is pending.  Corinthia Helmers S Bethani Brugger PA-C 01/22/2018 2:49 PM

## 2018-01-23 ENCOUNTER — Inpatient Hospital Stay (HOSPITAL_COMMUNITY): Payer: Medicare Other

## 2018-01-23 DIAGNOSIS — J90 Pleural effusion, not elsewhere classified: Secondary | ICD-10-CM

## 2018-01-23 DIAGNOSIS — I361 Nonrheumatic tricuspid (valve) insufficiency: Secondary | ICD-10-CM

## 2018-01-23 LAB — BASIC METABOLIC PANEL
Anion gap: 8 (ref 5–15)
BUN: 20 mg/dL (ref 8–23)
CO2: 22 mmol/L (ref 22–32)
Calcium: 8.3 mg/dL — ABNORMAL LOW (ref 8.9–10.3)
Chloride: 107 mmol/L (ref 98–111)
Creatinine, Ser: 1.42 mg/dL — ABNORMAL HIGH (ref 0.61–1.24)
GFR calc Af Amer: 53 mL/min — ABNORMAL LOW (ref >60)
GFR calc non Af Amer: 46 mL/min — ABNORMAL LOW (ref >60)
GLUCOSE: 85 mg/dL (ref 70–99)
Potassium: 4.2 mmol/L (ref 3.5–5.1)
Sodium: 137 mmol/L (ref 135–145)

## 2018-01-23 LAB — ECHOCARDIOGRAM COMPLETE
HEIGHTINCHES: 72 in
WEIGHTICAEL: 3248 [oz_av]

## 2018-01-23 LAB — CBC
HCT: 28.5 % — ABNORMAL LOW (ref 39.0–52.0)
Hemoglobin: 8.7 g/dL — ABNORMAL LOW (ref 13.0–17.0)
MCH: 26.9 pg (ref 26.0–34.0)
MCHC: 30.5 g/dL (ref 30.0–36.0)
MCV: 88 fL (ref 78.0–100.0)
PLATELETS: 144 10*3/uL — AB (ref 150–400)
RBC: 3.24 MIL/uL — ABNORMAL LOW (ref 4.22–5.81)
RDW: 18.3 % — ABNORMAL HIGH (ref 11.5–15.5)
WBC: 10 10*3/uL (ref 4.0–10.5)

## 2018-01-23 MED ORDER — LEVALBUTEROL HCL 1.25 MG/0.5ML IN NEBU
1.2500 mg | INHALATION_SOLUTION | Freq: Four times a day (QID) | RESPIRATORY_TRACT | Status: DC | PRN
  Administered 2018-01-29: 1.25 mg via RESPIRATORY_TRACT
  Filled 2018-01-23: qty 0.5

## 2018-01-23 MED ORDER — LEVALBUTEROL HCL 1.25 MG/0.5ML IN NEBU
1.2500 mg | INHALATION_SOLUTION | Freq: Three times a day (TID) | RESPIRATORY_TRACT | Status: DC
  Filled 2018-01-23: qty 0.5

## 2018-01-23 NOTE — Consult Note (Signed)
PULMONARY / CRITICAL CARE MEDICINE   Name: Barry Curry MRN: [id number] DOB: 1941/05/03    ADMISSION DATE:  01/21/2018 CONSULTATION DATE:  01/23/18  REFERRING MD:  Dr. Nevada Crane  CHIEF COMPLAINT:  Shortness of breath  HISTORY OF PRESENT ILLNESS:   Barry Curry is a 77 yo M with PMH significant for HLD, GERD, depression, anxiety, atrial fibrillation not on anticoagulation, CAD, PVC, aortic stenosis, right bundle blockage, Fe deficiency anemia, CKD, clear cell renal cancer with left nephrectomy, melanoma who presented on 01/21/18 with shortness of breath.  He reported that he had a right sided pleural effusion one month ago with a thoracentesis which improved his shortness of breath at that time.  He was seen by his PCP on 7/24, who prescribed doxycycline for presumed pneumonia, but his shortness of breath did not improve.    After admission, a CTA was performed, which showed a redevelopment of a R pleural effusion as well as multiple pulmonary nodules and nodules on his right adrenal gland concerning for metastasis of clear cell renal carcinoma.  Thoracentesis was performed and yielded two liters of blood.  Although patient's shortness of breath resolved with thoracentesis, he was concerned about the possibility of metastasis of his renal carcinoma and wished to wait for the results of the pleural fluid cytology before discharge.  Pulmonology was consulted to discuss this with the patient.  PAST MEDICAL HISTORY :  He  has a past medical history of Anemia, Anxiety, Aortic stenosis, Arthritis, Cancer ([place]), Cancer of kidney ([place]), Cardiomyopathy ([place]), Clear cell carcinoma of kidney, left ([place]) (1/65/5374), Complication of anesthesia, Coronary artery disease, Depression, Dyspnea, Esophageal stricture, Goals of care, counseling/discussion (11/17/2017), Heart murmur, History of atrial fibrillation, History of claustrophobia, Hyperlipemia, Hypertension, Left kidney mass, Lower extremity edema, Mitral  stenosis, PVC (premature ventricular contraction), and RBBB (right bundle branch block with left anterior fascicular block).  PAST SURGICAL HISTORY: He  has a past surgical history that includes Esophageal dilation; Total knee arthroplasty (Left); Total knee arthroplasty (Right); Total hip arthroplasty (Left); Total hip arthroplasty (Right); Total shoulder replacement (Left); Total shoulder replacement (Right); Shoulder arthroscopy (N/A); Knee arthroscopy (Left); Knee arthroscopy (Right); Cardioversion; Colonoscopy; Upper gi endoscopy; and Robot assisted laparoscopic nephrectomy (Left, 07/23/2017).  Allergies  Allergen Reactions  . Penicillins Rash    Has patient had a PCN reaction causing immediate rash, facial/tongue/throat swelling, SOB or lightheadedness with hypotension: No Has patient had a PCN reaction causing severe rash involving mucus membranes or skin necrosis: No Has patient had a PCN reaction that required hospitalization: No Has patient had a PCN reaction occurring within the last 10 years: No If all of the above answers are "NO", then may proceed with Cephalosporin use.     No current facility-administered medications on file prior to encounter.    Current Outpatient Medications on File Prior to Encounter  Medication Sig  . acetaminophen (TYLENOL) 500 MG tablet Take 500 mg by mouth daily.   . diclofenac sodium (VOLTAREN) 1 % GEL Apply 4 g topically as needed (joint pain).   Marland Kitchen doxycycline (VIBRAMYCIN) 100 MG capsule Take 100 mg by mouth 2 (two) times daily. For 10 days  . Dronedarone HCl (MULTAQ PO) Take by mouth.  . Ferrous Sulfate Dried (FERROUS SULFATE CR PO) Take by mouth daily.  . fluticasone (FLONASE) 50 MCG/ACT nasal spray Place 2 sprays into both nostrils at bedtime.  . lipase/protease/amylase (CREON) 36000 UNITS CPEP capsule Take 2 capsules (72,000 Units total) by mouth 2 (two) times daily before  lunch and supper.  . loperamide (IMODIUM) 2 MG capsule Take 2 mg by  mouth as needed.   Marland Kitchen LORazepam (ATIVAN) 0.5 MG tablet Take 0.5 mg by mouth every 8 (eight) hours as needed for anxiety.  . metoprolol tartrate (LOPRESSOR) 25 MG tablet Take 12.5 mg by mouth 2 (two) times daily.  . pantoprazole (PROTONIX) 40 MG tablet Take 40 mg by mouth daily.  . tamsulosin (FLOMAX) 0.4 MG CAPS capsule Take 0.4 mg by mouth daily after supper.  . vitamin B-6 (PYRIDOXINE) 25 MG tablet Take 25 mg by mouth 2 (two) times daily.  . diazepam (VALIUM) 5 MG tablet Give one hour prior to exam. (Patient not taking: Reported on 01/22/2018)  . IRON PO Take by mouth.  . METOPROLOL SUCCINATE PO Take by mouth.    FAMILY HISTORY:  His family history includes Breast cancer in his mother; Diabetes Mellitus II in his father and sister.  SOCIAL HISTORY: He  reports that he quit smoking about 46 years ago. His smoking use included cigarettes. He has a 0.40 pack-year smoking history. He has never used smokeless tobacco. He reports that he drinks alcohol. He reports that he does not use drugs.  SUBJECTIVE:  Patient reports improved shortness of breath and improved pain after thoracentesis.  VITAL SIGNS: BP 122/74 (BP Location: Left Arm)   Pulse 89   Temp 97.6 F (36.4 C) (Oral)   Resp (!) 23   Ht 6' (1.829 m)   Wt 203 lb (92.1 kg)   SpO2 100%   BMI 27.53 kg/m   INTAKE / OUTPUT: I/O last 3 completed shifts: In: 360 [P.O.:360] Out: -   PHYSICAL EXAMINATION: General:  Elderly male lying in bed in NAD Neuro:  No gross deficits, alert and oriented HEENT:  MMM, EOMI Cardiovascular:  RRR, no MRG Lungs:  CTAB, slightly increased WOB while talking Abdomen:  Soft, nontender, nondistended Musculoskeletal:  Full ROM of all extremities Skin:  No skin breakdown or rashes  LABS:  BMET Recent Labs  Lab 01/21/18 1142 01/22/18 0401 01/23/18 0534  NA 134* 139 137  K 4.5 4.3 4.2  CL 104 108 107  CO2 24 21* 22  BUN 23 20 20   CREATININE 1.53* 1.32* 1.42*  GLUCOSE 117* 101* 85     Electrolytes Recent Labs  Lab 01/21/18 1142 01/22/18 0401 01/23/18 0534  CALCIUM 8.6* 8.5* 8.3*    CBC Recent Labs  Lab 01/21/18 1142 01/22/18 0401 01/23/18 0534  WBC 10.2 10.0 10.0  HGB 10.1* 9.1* 8.7*  HCT 30.8* 29.3* 28.5*  PLT 155 151 144*    Coag's Recent Labs  Lab 01/21/18 1142 01/21/18 2225  APTT  --  42*  INR 1.08  --     Sepsis Markers Recent Labs  Lab 01/21/18 1147  LATICACIDVEN 1.50    ABG No results for input(s): PHART, PCO2ART, PO2ART in the last 168 hours.  Liver Enzymes Recent Labs  Lab 01/21/18 1142  AST 22  ALT 13  ALKPHOS 82  BILITOT 0.4  ALBUMIN 3.3*    Cardiac Enzymes Recent Labs  Lab 01/21/18 2225 01/22/18 0401 01/22/18 1001  TROPONINI <0.03 0.03* <0.03    Glucose No results for input(s): GLUCAP in the last 168 hours.  Imaging Dg Chest 1 View  Result Date: 01/22/2018 CLINICAL DATA:  Post right thoracentesis EXAM: CHEST  1 VIEW COMPARISON:  01/21/2018 FINDINGS: Cardiac shadow is stable. Pacing device is again seen and stable. Left lung is clear. Small right-sided pleural effusion is noted.  No pneumothorax is noted following thoracentesis. No acute bony abnormality is noted. Postsurgical changes are noted in the shoulder joints bilaterally. IMPRESSION: No pneumothorax following right thoracentesis. Minimal residual effusion is noted. Electronically Signed   By: Inez Catalina M.D.   On: 01/22/2018 15:08   US Thoracentesis Asp Pleural Space W/img Guide  Result Date: 01/22/2018 INDICATION: Three of left renal cell cancer. Large right pleural effusion. Request for diagnostic and therapeutic thoracentesis. EXAM: ULTRASOUND GUIDED RIGHT THORACENTESIS MEDICATIONS: 1% lidocaine 10 mL COMPLICATIONS: None immediate. PROCEDURE: An ultrasound guided thoracentesis was thoroughly discussed with the patient and questions answered. The benefits, risks, alternatives and complications were also discussed. The patient understands and wishes  to proceed with the procedure. Written consent was obtained. Ultrasound was performed to localize and mark an adequate pocket of fluid in the right chest. The area was then prepped and draped in the normal sterile fashion. 1% Lidocaine was used for local anesthesia. Under ultrasound guidance a 6 Fr Safe-T-Centesis catheter was introduced. Thoracentesis was performed. The catheter was removed and a dressing applied. FINDINGS: A total of approximately 2 L of blood was removed. Samples were sent to the laboratory as requested by the clinical team. IMPRESSION: Successful ultrasound guided right thoracentesis yielding 2 L of pleural fluid. Read by: Gareth Eagle, PA-C Electronically Signed   By: Aletta Edouard M.D.   On: 01/22/2018 14:48     STUDIES:  CTA with R sided pleural effusion, R sided pulmonary nodules, R adrenal gland nodules  CULTURES: Gram stain, pleural fluid 7/27> Pleural fluid culture 7/27> Pleural fluid cytology 7/27> Urine culture 7/26>20,000 colonies pseudomonas (not significant) Blood culture 7/26> NG<24H  ANTIBIOTICS: Doxycycline 7/24>  SIGNIFICANT EVENTS: 7/27> Thoracentesis yielding 2 L bloody fluid  LINES/TUBES: L and R antecubital PIV 7/26>  DISCUSSION: Patient with large pleural effusion likely due to metastatic renal carcinoma.  This pleural effusion is recurrent and causes shortness of breath.  Cytology of pleural fluid not yet back, so definitive cause of pleural effusion not yet known.  Pulmonology is consulted for further management of pleural nodules and future pleural effusions.  ASSESSMENT / PLAN:  PULMONARY A: Acute hypoxic respiratory from R pleural effusion likely d/t metastatic renal carcinoma, resolved with thoracentesis Pulmonary nodules visualized on CTA New R internal mammary lymphadenopathy Pneumonia unlikely given known source of respiratory distress, normal WBC, no fever P:   Follow pleural fluid cytology, gram stain, culture Supplemental O2  for saturation>90% Continue duoneb Consider biopsy of lymph nodes if cytology is negative  CARDIOVASCULAR A:  Hypertension P:  Continue hydralazine PRN  RENAL A:   AKI on CKD3 d/t L nephrectomy Stage IIIa clear cell carcinoma, not on chemotherapy d/t intolerance P:   Daily BMP  GASTROINTESTINAL A:   No issues P:   zofran for nausea  HEMATOLOGIC A:   Anemia P:  Fe supplementation Daily CBC  INFECTIOUS A:   Pneumonia unlikely P:   Continue doxycycline per primary team  ENDOCRINE A:   No issues    NEUROLOGIC A:   No issues  FAMILY  - Updates:    Ryelynn Guedea C. Shan Levans, MD PGY-2, [facility] 01/23/2018 2:20 PM   Pulmonary and [place] Pager: [phone]  01/23/2018, 1:45 PM

## 2018-01-23 NOTE — Progress Notes (Signed)
  Echocardiogram 2D Echocardiogram has been performed.  Barry Curry 01/23/2018, 11:40 AM

## 2018-01-23 NOTE — Plan of Care (Signed)
Discussed plan of care with patient.  Emphasized the importance of taking mediations as prescribed.  Also emphasized using the call bell when assistance is needed.  Good teach back displayed.

## 2018-01-23 NOTE — Progress Notes (Signed)
PROGRESS NOTE  Barry Curry KDT:[id number] DOB: 1940/10/12 DOA: 01/21/2018 PCP: [name], PA-C  HPI/Recap of past 24 hours: Barry Curry is a 77 y.o. male with medical history significant of hypertension, hyperlipidemia, GERD, depression, anxiety, atrial fibrillation not on anticoagulants, CAD, PVC, aortic stenosis, RBBB, iron deficiency anemia, CKD, clear renal cell cancer (left nephrectomy), melanoma, OSA who presents with shortness of breath.  3 weeks prior had a right thoracentesis with 670 cc of bloody fluid removed.  At this admission on 01/22/2018 patient had a ultrasound-guided right thoracentesis with 2 L of bloody fluid removed.  Pleural fluid sent for Gram stain, culture, cytology.  Pulmonology consulted to assess for recurrence of pleural effusion.  01/23/2018: Patient seen and examined with his daughter at bedside.  Reports his breathing is better post right thoracentesis.  Concern about being discharged prematurely prior to finding out the etiology of his recurrent right pleural effusion.  Patient reassured that we are awaiting results of cytology and if negative will obtain a PET scan and possibly biopsies of renal mass through interventional radiology.     Assessment/Plan: Principal Problem:   Recurrent right pleural effusion Active Problems:   Essential hypertension   GERD   Acute respiratory failure with hypoxia (HCC)   PAF (paroxysmal atrial fibrillation) (HCC)   Clear cell carcinoma of kidney, left (HCC)   Anxiety   CAD (coronary artery disease)   Iron deficiency anemia   Elevated troponin   CKD (chronic kidney disease), stage III (HCC)  Improving acute hypoxic respiratory failure most likely secondary to recurrent right pleural effusion Post right thoracentesis with 2 L of bloody fluid removed Had a right thoracentesis done 3 weeks ago at [facility] where 670 cc of bloody fluid yielded Continue O2 supplementation to maintain O2 saturation  greater than 92% Continue nebs  Recurrent right pleural effusion post right thoracentesis on 01/22/2018 Ultrasound-guided right thoracentesis with 2 L of bloody fluid yielded Body fluid Gram stain, cytology, and cultures pending Chest x-ray post thoracentesis personally reviewed unremarkable for any acute findings.  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 chemotherapy due to side effect Awaiting results of pleural fluid cytology to assess for malignancy  CKD 3 in the setting of single kidney, worsening GFR 50 Creatinine 1.42 from 1.32 yesterday Avoid nephrotoxic agents Repeat BMP in the am   Hypertension Blood pressure stable  Iron deficiency anemia, worsening Hemoglobin dropped from 9.1-8.7 Continue ferrous sulfate Repeat CBC in the morning    Code Status: Full code  Family Communication: Daughter [name] at bedside [phone].  Updated on current condition and management.  Disposition Plan: Home possibly in 3 to 4 days   Consultants:  Pulmonology  Procedures:  Right thoracentesis 01/22/2018  Antimicrobials:  P.o. doxycycline  DVT prophylaxis: Subcu Lovenox daily   Objective: Vitals:   01/22/18 2239 01/23/18 0300 01/23/18 0820 01/23/18 1154  BP:  120/64 (!) 119/48 122/74  Pulse:  94 (!) 51 89  Resp:  (!) 22 20 (!) 23  Temp:  99.3 F (37.4 C) 98.4 F (36.9 C) 97.6  F (36.4 C)  TempSrc:  Oral Oral Oral  SpO2: 95% 95% 99% 100%  Weight:      Height:         Intake/Output Summary (Last 24 hours) at 01/23/2018 1311 Last data filed at 01/23/2018 1610 Gross per 24 hour  Intake 360 ml  Output 0 ml  Net 360 ml   Filed Weights   01/21/18 1107  Weight: 92.1 kg (203 lb)    Exam:  . General: 77 y.o. year-old male well-developed well-nourished in no acute distress.  Alert oriented x3.  Cardiovascular: Regular rate and rhythm with no rubs or gallops.  No thyromegaly or JVD noted..   . Respiratory: Rales diffusely and bilaterally. Good inspiratory effort. . Abdomen: Soft nontender nondistended with normal bowel sounds x4 quadrants. . Musculoskeletal: No lower extremity edema. 2/4 pulses in all 4 extremities. . Skin: No ulcerative lesions noted or rashes . Psychiatry: Mood is appropriate for condition and setting   Data Reviewed: CBC: Recent Labs  Lab 01/21/18 1142 01/22/18 0401 01/23/18 0534  WBC 10.2 10.0 10.0  NEUTROABS 8.0*  --   --   HGB 10.1* 9.1* 8.7*  HCT 30.8* 29.3* 28.5*  MCV 84.6 87.2 88.0  PLT 155 151 960*   Basic Metabolic Panel: Recent Labs  Lab 01/21/18 1142 01/22/18 0401 01/23/18 0534  NA 134* 139 137  K 4.5 4.3 4.2  CL 104 108 107  CO2 24 21* 22  GLUCOSE 117* 101* 85  BUN 23 20 20   CREATININE 1.53* 1.32* 1.42*  CALCIUM 8.6* 8.5* 8.3*   GFR: Estimated Creatinine Clearance: 47.8 mL/min (A) (by C-G formula based on SCr of 1.42 mg/dL (H)). Liver Function Tests: Recent Labs  Lab 01/21/18 1142  AST 22  ALT 13  ALKPHOS 82  BILITOT 0.4  PROT 6.6  ALBUMIN 3.3*   No results for input(s): LIPASE, AMYLASE in the last 168 hours. No results for input(s): AMMONIA in the last 168 hours. Coagulation Profile: Recent Labs  Lab 01/21/18 1142  INR 1.08   Cardiac Enzymes: Recent Labs  Lab 01/21/18 1142 01/21/18 2225 01/22/18 0401 01/22/18 1001  TROPONINI 0.03* <0.03 0.03* <0.03   BNP (last 3 results) No results for input(s): PROBNP in the last 8760 hours. HbA1C: Recent Labs    01/22/18 0401  HGBA1C  5.4   CBG: No results for input(s): GLUCAP in the last 168 hours. Lipid Profile: Recent Labs    01/22/18 0401  CHOL 131  HDL 31*  LDLCALC 77  TRIG 116  CHOLHDL 4.2   Thyroid Function Tests: No results for input(s): TSH, T4TOTAL, FREET4, T3FREE, THYROIDAB in the last 72 hours. Anemia Panel: No results for input(s): VITAMINB12, FOLATE, FERRITIN, TIBC, IRON, RETICCTPCT in the last 72 hours. Urine analysis:    Component Value Date/Time   COLORURINE YELLOW 01/21/2018 1432   APPEARANCEUR CLEAR 01/21/2018 1432   LABSPEC <1.005 (L) 01/21/2018 1432   PHURINE 5.5 01/21/2018 1432   GLUCOSEU NEGATIVE 01/21/2018 1432   HGBUR NEGATIVE 01/21/2018 1432   BILIRUBINUR NEGATIVE 01/21/2018 1432   KETONESUR NEGATIVE 01/21/2018 1432   PROTEINUR NEGATIVE 01/21/2018 1432   UROBILINOGEN 1.0 03/21/2010 1348   NITRITE NEGATIVE 01/21/2018 1432   LEUKOCYTESUR NEGATIVE 01/21/2018 1432   Sepsis Labs: @LABRCNTIP (procalcitonin:4,lacticidven:4)  ) Recent Results (from the past 240 hour(s))  Culture, blood (Routine x 2)     Status: None (Preliminary result)   Collection Time: 01/21/18 11:40 AM  Result Value Ref Range Status   Specimen Description   Final  BLOOD RIGHT ARM Performed at [facility], [address] [place], [place] [address]    Special Requests   Final    BOTTLES DRAWN AEROBIC AND ANAEROBIC Blood Culture adequate volume Performed at [facility], [address] [place], [place] [address]    Culture   Final    NO GROWTH < 24 HOURS Performed at [facility], [place] [address] [place], [place] [address]    Report Status PENDING  Incomplete  Culture, blood (Routine x 2)     Status: None (Preliminary result)   Collection Time: 01/21/18 11:50 AM  Result Value Ref Range Status   Specimen Description   Final    BLOOD LEFT ARM Performed at [facility], [address] [place], [place] [address]    Special Requests   Final    BOTTLES DRAWN  AEROBIC AND ANAEROBIC Blood Culture results may not be optimal due to an inadequate volume of blood received in culture bottles Performed at [facility], [address] [place], [place] [address]    Culture   Final    NO GROWTH < 24 HOURS Performed at [facility], [place] [address], [place], [place] [address]    Report Status PENDING  Incomplete  Urine culture     Status: Abnormal (Preliminary result)   Collection Time: 01/21/18  2:32 PM  Result Value Ref Range Status   Specimen Description   Final    URINE, CLEAN CATCH Performed at [facility], [address] [place], [place] [address]    Special Requests   Final    NONE Performed at [facility], [address] [place], [place] [address]    Culture 20,000 COLONIES/mL PSEUDOMONAS AERUGINOSA (A)  Final   Report Status PENDING  Incomplete  MRSA PCR Screening     Status: None   Collection Time: 01/21/18  8:47 PM  Result Value Ref Range Status   MRSA by PCR NEGATIVE NEGATIVE Final    Comment:        The GeneXpert MRSA Assay (FDA approved for NASAL specimens only), is one component of a comprehensive MRSA colonization surveillance program. It is not intended to diagnose MRSA infection nor to guide or monitor treatment for MRSA infections. Performed at [facility], [place] [address], [place], [place] [address]   Gram stain     Status: None   Collection Time: 01/22/18  2:59 PM  Result Value Ref Range Status   Specimen Description PLEURAL RIGHT  Final   Special Requests NONE  Final   Gram Stain   Final    FEW WBC PRESENT, PREDOMINANTLY MONONUCLEAR NO ORGANISMS SEEN Performed at [facility], [address], [place], [place] [address]    Report Status 01/22/2018 FINAL  Final      Studies: Dg Chest 1 View  Result Date: 01/22/2018 CLINICAL DATA:  Post right thoracentesis EXAM: CHEST  1 VIEW COMPARISON:  01/21/2018 FINDINGS: Cardiac shadow is stable. Pacing device is again  seen and stable. Left lung is clear. Small right-sided pleural effusion is noted. No pneumothorax is noted following thoracentesis. No acute bony abnormality is noted. Postsurgical changes are noted in the shoulder joints bilaterally. IMPRESSION: No pneumothorax following right thoracentesis. Minimal residual effusion is noted. Electronically Signed   By: Inez Catalina M.D.   On: 01/22/2018 15:08   US Thoracentesis Asp Pleural Space W/img Guide  Result Date: 01/22/2018 INDICATION: Three of left renal cell cancer. Large right pleural effusion. Request for diagnostic and therapeutic thoracentesis. EXAM: ULTRASOUND GUIDED RIGHT THORACENTESIS MEDICATIONS: 1% lidocaine 10 mL COMPLICATIONS: None immediate. PROCEDURE: An ultrasound guided thoracentesis was thoroughly discussed with the patient and questions answered. The benefits, risks, alternatives and complications were also discussed. The patient understands and wishes to proceed with the procedure. Written consent was obtained. Ultrasound was performed to localize and mark an adequate pocket of fluid in the right chest. The area was then prepped and draped in the normal sterile fashion. 1% Lidocaine was used for local anesthesia. Under ultrasound guidance a 6 Fr Safe-T-Centesis catheter was introduced. Thoracentesis was performed. The catheter was removed and a dressing applied. FINDINGS: A total of approximately 2 L of blood was removed. Samples were sent to the laboratory as requested by the clinical team. IMPRESSION: Successful ultrasound guided right thoracentesis yielding 2 L of pleural fluid. Read by: Gareth Eagle, PA-C Electronically Signed   By: Aletta Edouard M.D.   On: 01/22/2018 14:48    Scheduled Meds: . doxycycline  100 mg Oral Q12H  . dronedarone  400 mg Oral BID WC  . feeding supplement (ENSURE ENLIVE)  237 mL Oral BID BM  . ferrous sulfate  325 mg Oral Q breakfast  . fluticasone  2 spray Each Nare QHS  . lipase/protease/amylase  72,000  Units Oral BID AC  . metoprolol succinate  25 mg Oral Daily  . pantoprazole  40 mg Oral Daily  . tamsulosin  0.4 mg Oral QPC supper  . vitamin B-6  25 mg Oral BID    Continuous Infusions:   LOS: 1 day     Kayleen Memos, MD [facility] Pager [phone]  If 7PM-7AM, please contact night-coverage [email or web address] Password [email or web address] 01/23/2018, 1:11 PM

## 2018-01-23 NOTE — Plan of Care (Signed)
Discussed plan of care with patient.  Emphasized the importance of using the call bell when assistance is needed.  Sister is at bedside.  Also discussed pain management.  Good teach back displayed.

## 2018-01-24 DIAGNOSIS — E440 Moderate protein-calorie malnutrition: Secondary | ICD-10-CM

## 2018-01-24 DIAGNOSIS — D649 Anemia, unspecified: Secondary | ICD-10-CM

## 2018-01-24 DIAGNOSIS — C642 Malignant neoplasm of left kidney, except renal pelvis: Secondary | ICD-10-CM

## 2018-01-24 DIAGNOSIS — Z950 Presence of cardiac pacemaker: Secondary | ICD-10-CM

## 2018-01-24 LAB — COMPREHENSIVE METABOLIC PANEL
ALT: 14 U/L (ref 0–44)
AST: 22 U/L (ref 15–41)
Albumin: 2.9 g/dL — ABNORMAL LOW (ref 3.5–5.0)
Alkaline Phosphatase: 73 U/L (ref 38–126)
Anion gap: 13 (ref 5–15)
BUN: 18 mg/dL (ref 8–23)
CO2: 19 mmol/L — AB (ref 22–32)
CREATININE: 1.29 mg/dL — AB (ref 0.61–1.24)
Calcium: 8.8 mg/dL — ABNORMAL LOW (ref 8.9–10.3)
Chloride: 103 mmol/L (ref 98–111)
GFR calc Af Amer: >60 mL/min (ref >60)
GFR, EST NON AFRICAN AMERICAN: 52 mL/min — AB (ref >60)
GLUCOSE: 86 mg/dL (ref 70–99)
Potassium: 4.3 mmol/L (ref 3.5–5.1)
Sodium: 135 mmol/L (ref 135–145)
Total Bilirubin: 0.8 mg/dL (ref 0.3–1.2)
Total Protein: 5.5 g/dL — ABNORMAL LOW (ref 6.5–8.1)

## 2018-01-24 LAB — CBC WITH DIFFERENTIAL/PLATELET
ABS IMMATURE GRANULOCYTES: 0.1 10*3/uL (ref 0.0–0.1)
BASOS PCT: 0 %
Basophils Absolute: 0 10*3/uL (ref 0.0–0.1)
Eosinophils Absolute: 0.3 10*3/uL (ref 0.0–0.7)
Eosinophils Relative: 3 %
HCT: 31.1 % — ABNORMAL LOW (ref 39.0–52.0)
Hemoglobin: 9.5 g/dL — ABNORMAL LOW (ref 13.0–17.0)
IMMATURE GRANULOCYTES: 1 %
Lymphocytes Relative: 8 %
Lymphs Abs: 0.9 10*3/uL (ref 0.7–4.0)
MCH: 27.1 pg (ref 26.0–34.0)
MCHC: 30.5 g/dL (ref 30.0–36.0)
MCV: 88.9 fL (ref 78.0–100.0)
MONOS PCT: 12 %
Monocytes Absolute: 1.4 10*3/uL — ABNORMAL HIGH (ref 0.1–1.0)
NEUTROS PCT: 76 %
Neutro Abs: 9.4 10*3/uL — ABNORMAL HIGH (ref 1.7–7.7)
Platelets: 182 10*3/uL (ref 150–400)
RBC: 3.5 MIL/uL — ABNORMAL LOW (ref 4.22–5.81)
RDW: 18.2 % — ABNORMAL HIGH (ref 11.5–15.5)
WBC: 12.2 10*3/uL — ABNORMAL HIGH (ref 4.0–10.5)

## 2018-01-24 LAB — IRON AND TIBC
IRON: 40 ug/dL — AB (ref 45–182)
SATURATION RATIOS: 16 % — AB (ref 17.9–39.5)
TIBC: 256 ug/dL (ref 250–450)
UIBC: 216 ug/dL

## 2018-01-24 LAB — URINE CULTURE: Culture: 20000 — AB

## 2018-01-24 LAB — FERRITIN: FERRITIN: 116 ng/mL (ref 24–336)

## 2018-01-24 LAB — PH, BODY FLUID: PH, BODY FLUID: 7.3

## 2018-01-24 MED ORDER — SODIUM CHLORIDE 3 % IN NEBU
4.0000 mL | INHALATION_SOLUTION | Freq: Every day | RESPIRATORY_TRACT | Status: DC
  Administered 2018-01-24: 4 mL via RESPIRATORY_TRACT
  Filled 2018-01-24 (×2): qty 4

## 2018-01-24 MED ORDER — METHYLPREDNISOLONE SODIUM SUCC 125 MG IJ SOLR
80.0000 mg | Freq: Once | INTRAMUSCULAR | Status: AC
  Administered 2018-01-25: 80 mg via INTRAVENOUS
  Filled 2018-01-24: qty 2

## 2018-01-24 MED ORDER — SODIUM CHLORIDE 0.9 % IV SOLN
510.0000 mg | Freq: Once | INTRAVENOUS | Status: AC
  Administered 2018-01-25: 510 mg via INTRAVENOUS
  Filled 2018-01-24 (×2): qty 17

## 2018-01-24 MED ORDER — SODIUM CHLORIDE 0.9 % IV SOLN
40.0000 mg | Freq: Once | INTRAVENOUS | Status: AC
  Administered 2018-01-25: 40 mg via INTRAVENOUS
  Filled 2018-01-24: qty 4

## 2018-01-24 MED ORDER — SODIUM CHLORIDE 0.9 % IV SOLN
INTRAVENOUS | Status: DC
  Administered 2018-01-24 – 2018-01-25 (×3): via INTRAVENOUS

## 2018-01-24 MED ORDER — ORAL CARE MOUTH RINSE
15.0000 mL | Freq: Two times a day (BID) | OROMUCOSAL | Status: DC
  Administered 2018-01-24 – 2018-01-31 (×12): 15 mL via OROMUCOSAL

## 2018-01-24 MED ORDER — IPRATROPIUM-ALBUTEROL 0.5-2.5 (3) MG/3ML IN SOLN
3.0000 mL | Freq: Four times a day (QID) | RESPIRATORY_TRACT | Status: DC
  Administered 2018-01-24 (×2): 3 mL via RESPIRATORY_TRACT
  Filled 2018-01-24 (×3): qty 3

## 2018-01-24 NOTE — Care Management Note (Addendum)
Case Management Note  Patient Details  Name: Barry Curry MRN: [id number] Date of Birth: 1941-03-24  Subjective/Objective:  From home, presents with r pl effusion, has hx of stage 3 cancer of l kidney, he is anemic, had thoracentesis, 2 liters removed, cytology pending, await results, if cytology positive then it may have metastasized, if negative they will consider thoracoscopic drainage with pleurodesis.    8/1 Tomi Bamberger RN, BSN - Poss VATS for patient.                  Action/Plan: NCM will follow for transition of care.  Expected Discharge Date:                  Expected Discharge Plan:  Home/Self Care  In-House Referral:     Discharge planning Services  CM Consult  Post Acute Care Choice:    Choice offered to:     DME Arranged:    DME Agency:     HH Arranged:    HH Agency:     Status of Service:  In process, will continue to follow  If discussed at Long Length of Stay Meetings, dates discussed:    Additional Comments:  Zenon Mayo, RN 01/24/2018, 9:39 AM

## 2018-01-24 NOTE — Progress Notes (Signed)
PROGRESS NOTE  [name] UMP:[id number] DOB: 08-07-1940 DOA: 01/21/2018 PCP: [name], PA-C  HPI/Recap of past 24 hours: Barry Curry is a 77 y.o. male with medical history significant of hypertension, hyperlipidemia, GERD, depression, anxiety, atrial fibrillation not on anticoagulants, CAD, PVC, aortic stenosis, RBBB, iron deficiency anemia, CKD, clear renal cell cancer (left nephrectomy), melanoma, OSA who presents with shortness of breath.  3 weeks prior had a right thoracentesis with 670 cc of bloody fluid removed.  At this admission on 01/22/2018 patient had a ultrasound-guided right thoracentesis with 2 L of bloody fluid removed.  Pleural fluid sent for Gram stain, culture, cytology.  Pulmonology consulted to assess for recurrence of pleural effusion.  01/23/2018: Patient seen and examined with his daughter at bedside.  Reports his breathing is better post right thoracentesis.  Concern about being discharged prematurely prior to finding out the etiology of his recurrent right pleural effusion.  Patient reassured that we are awaiting results of cytology and if negative will obtain a PET scan and possibly biopsies of renal mass through interventional radiology.  01/24/2018: Seen and examined at bedside.  Complains of nonproductive cough.  Denies chest pain or palpitations.  Added pulmonary toilet.     Assessment/Plan: Principal Problem:   Recurrent right pleural effusion Active Problems:   Essential hypertension   GERD   Acute respiratory failure with hypoxia (HCC)   PAF (paroxysmal atrial fibrillation) (HCC)   Clear cell carcinoma of kidney, left (HCC)   Anxiety   CAD (coronary artery disease)   Iron deficiency anemia   Elevated troponin   CKD (chronic kidney disease), stage III (HCC)   Malnutrition of moderate degree  Acute hypoxic respiratory failure most likely secondary to recurrent right pleural effusion, stable Post right thoracentesis with 2 L of bloody  fluid removed Had a right thoracentesis done 3 weeks ago at [facility] where 670 cc of bloody fluid yielded Continue O2 supplementation to maintain O2 saturation greater than 92% Continue nebs IV pulmonary toilet for nonproductive cough   Recurrent right pleural effusion post right thoracentesis on 01/22/2018 Recurrent right pleural effusion post thoracentesis appears stable at this time Ultrasound-guided right thoracentesis with 2 L of bloody fluid yielded Body fluid Gram stain, cytology, and cultures pending Chest x-ray post thoracentesis personally reviewed unremarkable for any acute findings.  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 chemotherapy due to side effect Awaiting results of pleural fluid cytology to determine if metastatic disease  CKD 3 in the setting of single kidney, worsening GFR 50 Renal function appears stable with creatinine 1.29 on 01/24/2018 from 1.42 yesterday. Continue to avoid nephrotoxic agents Repeat BMP in the morning  Leukocytosis WBC 12 K Afebrile Possibly reactive Obtain procalcitonin Repeat CBC in the morning  Moderate protein calorie malnutrition BMI 27 Albumin 2.9 Encourage increase of oral protein calorie  intake  Hypertension Blood pressure stable      Code Status: Full code  Family Communication: Daughter Barry Curry at bedside [phone].  Updated on current condition and management on 01/23/2018.  Disposition Plan: Home possibly in 3 to 4 days   Consultants:  Pulmonology  Procedures:  Right thoracentesis 01/22/2018  Antimicrobials:  None  DVT prophylaxis: Subcu Lovenox daily   Objective: Vitals:   01/24/18 0824 01/24/18 0901 01/24/18 1207 01/24/18 1244  BP:   (!) 92/57   Pulse:  (!) 113 96 92  Resp:   (!) 21   Temp:   98.4 F (36.9 C)   TempSrc:   Axillary   SpO2: 95%  97% 95%  Weight:      Height:        Intake/Output Summary (Last 24 hours) at 01/24/2018 1321 Last data filed at 01/24/2018 0600 Gross per 24 hour  Intake -  Output 600 ml  Net -600 ml   Filed Weights   01/21/18 1107  Weight: 92.1 kg (203 lb)    Exam:  . General: 77 y.o. year-old male well-developed well-nourished in no acute distress.  Alert and oriented x3.  Cardiovascular: Regular rate and rhythm with no rubs or gallops.  No thyromegaly or JVD noted. Marland Kitchen Respiratory: Rales diffusely and bilaterally. Good inspiratory effort. . Abdomen: Soft nontender nondistended with normal bowel sounds x4 quadrants. . Musculoskeletal: No lower extremity edema. 2/4 pulses in all 4 extremities. Marland Kitchen Psychiatry: Mood is appropriate for condition and setting   Data Reviewed: CBC: Recent Labs  Lab 01/21/18 1142 01/22/18 0401 01/23/18 0534 01/24/18 0752  WBC 10.2 10.0 10.0 12.2*  NEUTROABS 8.0*  --   --  9.4*  HGB 10.1* 9.1* 8.7* 9.5*  HCT 30.8* 29.3* 28.5* 31.1*  MCV 84.6 87.2 88.0 88.9  PLT 155 151 144* 094   Basic Metabolic Panel: Recent Labs  Lab 01/21/18 1142 01/22/18 0401 01/23/18 0534 01/24/18 0752  NA 134* 139 137 135  K 4.5 4.3 4.2 4.3  CL 104 108 107 103  CO2 24 21* 22 19*  GLUCOSE 117* 101* 85 86  BUN 23 20 20 18   CREATININE 1.53* 1.32* 1.42* 1.29*  CALCIUM 8.6* 8.5* 8.3*  8.8*   GFR: Estimated Creatinine Clearance: 52.6 mL/min (A) (by C-G formula based on SCr of 1.29 mg/dL (H)). Liver Function Tests: Recent Labs  Lab 01/21/18 1142 01/24/18 0752  AST 22 22  ALT 13 14  ALKPHOS 82 73  BILITOT 0.4 0.8  PROT 6.6 5.5*  ALBUMIN 3.3* 2.9*   No results for input(s): LIPASE, AMYLASE in the last 168 hours. No results for input(s): AMMONIA in the last 168 hours. Coagulation Profile: Recent Labs  Lab 01/21/18 1142  INR 1.08   Cardiac Enzymes: Recent Labs  Lab 01/21/18 1142 01/21/18 2225 01/22/18 0401 01/22/18 1001  TROPONINI 0.03* <0.03 0.03* <0.03   BNP (last 3 results) No results for input(s): PROBNP in the last 8760 hours. HbA1C: Recent Labs    01/22/18 0401  HGBA1C 5.4   CBG: No results for input(s): GLUCAP in the last 168 hours. Lipid Profile: Recent Labs    01/22/18 0401  CHOL 131  HDL 31*  LDLCALC 77  TRIG 116  CHOLHDL 4.2   Thyroid Function Tests: No results for input(s): TSH, T4TOTAL, FREET4, T3FREE, THYROIDAB in the last 72 hours. Anemia Panel: Recent Labs    01/24/18 0752  FERRITIN 116  TIBC 256  IRON 40*   Urine analysis:    Component Value  Date/Time   COLORURINE YELLOW 01/21/2018 1432   APPEARANCEUR CLEAR 01/21/2018 1432   LABSPEC <1.005 (L) 01/21/2018 1432   PHURINE 5.5 01/21/2018 1432   GLUCOSEU NEGATIVE 01/21/2018 1432   HGBUR NEGATIVE 01/21/2018 1432   BILIRUBINUR NEGATIVE 01/21/2018 1432   KETONESUR NEGATIVE 01/21/2018 1432   PROTEINUR NEGATIVE 01/21/2018 1432   UROBILINOGEN 1.0 03/21/2010 1348   NITRITE NEGATIVE 01/21/2018 1432   LEUKOCYTESUR NEGATIVE 01/21/2018 1432   Sepsis Labs: @LABRCNTIP (procalcitonin:4,lacticidven:4)  ) Recent Results (from the past 240 hour(s))  Culture, blood (Routine x 2)     Status: None (Preliminary result)   Collection Time: 01/21/18 11:40 AM  Result Value Ref Range Status   Specimen Description   Final    BLOOD RIGHT ARM Performed at [facility], [address] [place], [place] [address]    Special Requests   Final    BOTTLES DRAWN AEROBIC AND ANAEROBIC Blood Culture adequate volume Performed at [facility], [address] [place], [place] [address]    Culture   Final    NO GROWTH 2 DAYS Performed at [facility], [place] [address] [place], [place] [address]    Report Status PENDING  Incomplete  Culture, blood (Routine x 2)     Status: None (Preliminary result)   Collection Time: 01/21/18 11:50 AM  Result Value Ref Range Status   Specimen Description   Final    BLOOD LEFT ARM Performed at [facility], [address] [place], [place] [address]    Special Requests   Final    BOTTLES DRAWN AEROBIC AND ANAEROBIC Blood Culture results may not be optimal due to an inadequate volume of blood received in culture bottles Performed at [facility], [address] [place], [place] [address]    Culture   Final    NO GROWTH 2 DAYS Performed at [facility], [place] [address] [place], [place] [address]    Report Status PENDING  Incomplete  Urine culture     Status: Abnormal   Collection Time: 01/21/18  2:32 PM  Result Value Ref Range Status   Specimen Description   Final    URINE, CLEAN CATCH Performed at [facility], [address] [place], [place] [address]    Special Requests   Final    NONE Performed at [facility], [address] [place], [place] [address]    Culture 20,000 COLONIES/mL PSEUDOMONAS AERUGINOSA (A)  Final   Report Status 01/24/2018 FINAL  Final   Organism ID, Bacteria PSEUDOMONAS AERUGINOSA (A)  Final      Susceptibility   Pseudomonas aeruginosa - MIC*    CEFTAZIDIME 2 SENSITIVE Sensitive     CIPROFLOXACIN 1 SENSITIVE Sensitive     GENTAMICIN 4 SENSITIVE Sensitive     IMIPENEM 2 SENSITIVE Sensitive     PIP/TAZO <=4 SENSITIVE Sensitive     CEFEPIME 8 SENSITIVE Sensitive     * 20,000 COLONIES/mL PSEUDOMONAS AERUGINOSA  MRSA PCR Screening      Status: None   Collection Time: 01/21/18  8:47 PM  Result Value Ref Range Status   MRSA by PCR NEGATIVE NEGATIVE Final    Comment:        The GeneXpert MRSA Assay (FDA approved for NASAL specimens only), is one component of a comprehensive MRSA colonization surveillance program. It is not intended to diagnose MRSA infection nor to guide or monitor treatment  for MRSA infections. Performed at [facility], [place] [address], [place], [place] [address]   Culture, body fluid-bottle     Status: None (Preliminary result)   Collection Time: 01/22/18  2:59 PM  Result Value Ref Range Status   Specimen Description PLEURAL RIGHT  Final   Special Requests NONE  Final   Culture   Final    NO GROWTH < 24 HOURS Performed at [facility], [place] [address], [place], [place] [address]    Report Status PENDING  Incomplete  Gram stain     Status: None   Collection Time: 01/22/18  2:59 PM  Result Value Ref Range Status   Specimen Description PLEURAL RIGHT  Final   Special Requests NONE  Final   Gram Stain   Final    FEW WBC PRESENT, PREDOMINANTLY MONONUCLEAR NO ORGANISMS SEEN Performed at [facility], [address], [place], [place] [address]    Report Status 01/22/2018 FINAL  Final      Studies: No results found.  Scheduled Meds: . dronedarone  400 mg Oral BID WC  . feeding supplement (ENSURE ENLIVE)  237 mL Oral BID BM  . ferrous sulfate  325 mg Oral Q breakfast  . fluticasone  2 spray Each Nare QHS  . ipratropium-albuterol  3 mL Nebulization Q6H  . lipase/protease/amylase  72,000 Units Oral BID AC  . metoprolol succinate  25 mg Oral Daily  . pantoprazole  40 mg Oral Daily  . sodium chloride HYPERTONIC  4 mL Nebulization Daily  . tamsulosin  0.4 mg Oral QPC supper  . vitamin B-6  25 mg Oral BID    Continuous Infusions: . sodium chloride 75 mL/hr at 01/24/18 1028     LOS: 2 days     Kayleen Memos, MD [facility] Pager [phone]  If  7PM-7AM, please contact night-coverage [email or web address] Password [email or web address] 01/24/2018, 1:21 PM

## 2018-01-24 NOTE — Consult Note (Signed)
Referral MD  Reason for Referral: Recurrent right pleural effusion; stage IIIa clear cell carcinoma of the left kidney  Chief Complaint  Patient presents with  . Hypotension  : My fluid came back around my right lung.  HPI: Barry Curry is well-known to me.  He is a very nice 77 year old white male.  He has a history of a stage IIIa clear cell carcinoma of the left kidney.  I think he is removed back in January.  He had a couple positive lymph nodes.  We tried him on adjuvant therapy with Sutent.  He could not tolerate this despite lower doses.  He had fluid removed from the right lung at [facility] in early July.  I cannot find if a cytology report was made on this.  I have to believe that cytology was done.  I saw him on the 17th.  He was feeling okay.  He was feeling a bit tired.  Was taken off his Eliquis.  He also was on aspirin.  This was also stopped.  He was slightly anemic with a hemoglobin of 10.5.  He came into [facility], I think on Thursday or Friday.  He had a large right pleural effusion seen on CT angiogram.  There is no blood clot in the lung.  Of course, the radiologist commented that there is a new 8 mm internal mammary lymph node.  He underwent a thoracentesis.  He had 2 L of fluid removed.  It was bloody.  Cytology is pending.  He is quite anemic.  His white cell count on Sunday was  10 hemoglobin 8.7 platelet count 144,000.  His electrolytes were not done today.  His potassium was 4.2 yesterday.  Creatinine 1.42.Marland Kitchen  There are no labs ordered for today.  He is feeling a bit better since he had the fluid taken out.  He is not having diarrhea.  I did put him on some Creon to try to help with diarrhea.  He has had no nausea or vomiting.  He has had no rashes.  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  There does have cardiac issues.  Overall, his performance status is ECOG 3.   Past Medical History:  Diagnosis Date  . Anemia    history of  . Anxiety   . Aortic stenosis   . Arthritis   . Cancer ([place])    Melanoma back of neck  . Cancer of kidney ([place])   . Cardiomyopathy ([place])   . Clear cell carcinoma of kidney, left ([place]) 11/17/2017  . Complication of anesthesia    violent wake up from anesthesia  . Coronary artery disease   . Depression   . Dyspnea   . Esophageal stricture   . Goals of care, counseling/discussion 11/17/2017  . Heart murmur   . History of atrial fibrillation   . History of claustrophobia   . Hyperlipemia   . Hypertension   . Left kidney mass   . Lower extremity edema   . Mitral stenosis   . PVC (premature ventricular contraction)   . RBBB (right bundle branch block with left anterior fascicular block)   :  Past Surgical History:  Procedure Laterality Date  . CARDIOVERSION    . COLONOSCOPY    . ESOPHAGEAL DILATION    . KNEE ARTHROSCOPY Left   . KNEE ARTHROSCOPY Right   . ROBOT ASSISTED LAPAROSCOPIC NEPHRECTOMY Left 07/23/2017   Procedure: XI ROBOTIC ASSISTED LAPAROSCOPIC NEPHRECTOMY;  Surgeon: Alexis Frock, MD;  Location: [facility];  Service: Urology;  Laterality: Left;  . SHOULDER ARTHROSCOPY N/A   . TOTAL HIP ARTHROPLASTY Left   . TOTAL HIP ARTHROPLASTY Right   . TOTAL KNEE ARTHROPLASTY Left   . TOTAL KNEE ARTHROPLASTY Right   . TOTAL SHOULDER REPLACEMENT Left   . TOTAL SHOULDER REPLACEMENT Right   . UPPER GI ENDOSCOPY    :   Current Facility-Administered Medications:  .  acetaminophen (TYLENOL) tablet 500 mg, 500 mg, Oral, Q6H PRN, Ivor Costa, MD .  dextromethorphan-guaiFENesin ([place] DM) 30-600 MG per 12 hr tablet 1 tablet, 1 tablet, Oral, BID PRN, Ivor Costa, MD .  diazepam (VALIUM) tablet 5 mg, 5 mg, Oral, Q12H PRN, Ivor Costa, MD .  diclofenac sodium (VOLTAREN) 1 % transdermal gel 4 g, 4 g, Topical, QID PRN, Ivor Costa, MD .  doxycycline (VIBRA-TABS) tablet 100 mg, 100 mg, Oral, Q12H, Ivor Costa, MD, 100 mg at 01/23/18 1945 .  dronedarone (MULTAQ) tablet 400 mg, 400  mg, Oral, BID WC, Ivor Costa, MD, 400 mg at 01/24/18 0640 .  feeding supplement (ENSURE ENLIVE) (ENSURE ENLIVE) liquid 237 mL, 237 mL, Oral, BID BM, Hall, Carole N, DO, 237 mL at 01/23/18 0825 .  ferrous sulfate tablet 325 mg, 325 mg, Oral, Q breakfast, Ivor Costa, MD, 325 mg at 01/24/18 0640 .  fluticasone (FLONASE) 50 MCG/ACT nasal spray 2 spray, 2 spray, Each Nare, Gordan Payment, MD, 2 spray at 01/23/18 2105 .  hydrALAZINE (APRESOLINE) injection 5 mg, 5 mg, Intravenous, Q2H PRN, Ivor Costa, MD .  levalbuterol [facility]) nebulizer solution 1.25 mg, 1.25 mg, Nebulization, Q6H PRN, Hall, Carole N, DO .  lipase/protease/amylase (CREON) capsule 72,000 Units, 72,000 Units, Oral, BID Renella Cunas, MD, 72,000 Units at 01/23/18 1637 .  loperamide (IMODIUM) capsule 2 mg, 2 mg, Oral, PRN, Ivor Costa, MD .  metoprolol succinate (TOPROL-XL) 24 hr tablet 25 mg, 25 mg, Oral, Daily, Ivor Costa, MD, 25 mg at 01/23/18 0826 .  ondansetron (ZOFRAN) tablet 4 mg, 4 mg, Oral, Q6H PRN **OR** ondansetron (ZOFRAN) injection 4 mg, 4 mg, Intravenous, Q6H PRN, Ivor Costa, MD .  pantoprazole (PROTONIX) EC tablet 40 mg, 40 mg, Oral, Daily, Ivor Costa, MD, 40 mg at 01/23/18 0826 .  tamsulosin (FLOMAX) capsule 0.4 mg, 0.4 mg, Oral, QPC supper, Ivor Costa, MD, 0.4 mg at 01/23/18 1637 .  vitamin B-6 (pyridOXINE) tablet 25 mg, 25 mg, Oral, BID, Ivor Costa, MD, 25 mg at 01/23/18 2105 .  zolpidem (AMBIEN) tablet 5 mg, 5 mg, Oral, QHS PRN, Ivor Costa, MD, 5 mg at 01/21/18 2324:  . doxycycline  100 mg Oral Q12H  . dronedarone  400 mg Oral BID WC  . feeding supplement (ENSURE ENLIVE)  237 mL Oral BID BM  . ferrous sulfate  325 mg Oral Q breakfast  . fluticasone  2 spray Each Nare QHS  . lipase/protease/amylase  72,000 Units Oral BID AC  . metoprolol succinate  25 mg Oral Daily  . pantoprazole  40 mg Oral Daily  . tamsulosin  0.4 mg Oral QPC supper  . vitamin B-6  25 mg Oral BID  :  Allergies  Allergen Reactions  .  Penicillins Rash    Has patient had a PCN reaction causing immediate rash, facial/tongue/throat swelling, SOB or lightheadedness with hypotension: No Has patient had a PCN reaction causing severe rash involving mucus membranes or skin necrosis: No Has patient had a PCN reaction that required hospitalization: No Has  patient had a PCN reaction occurring within the last 10 years: No If all of the above answers are "NO", then may proceed with Cephalosporin use.   :  Family History  Problem Relation Age of Onset  . Breast cancer Mother   . Diabetes Mellitus II Father   . Diabetes Mellitus II Sister   :  Social History   Socioeconomic History  . Marital status: Widowed    Spouse name: Not on file  . Number of children: Not on file  . Years of education: Not on file  . Highest education level: Not on file  Occupational History  . Not on file  Social Needs  . Financial resource strain: Not on file  . Food insecurity:    Worry: Not on file    Inability: Not on file  . Transportation needs:    Medical: Not on file    Non-medical: Not on file  Tobacco Use  . Smoking status: Former Smoker    Packs/day: 0.20    Years: 2.00    Pack years: 0.40    Types: Cigarettes    Last attempt to quit: 07/16/1971    Years since quitting: 46.5  . Smokeless tobacco: Never Used  Substance and Sexual Activity  . Alcohol use: Yes    Comment: daily Scotch  . Drug use: No  . Sexual activity: Not on file  Lifestyle  . Physical activity:    Days per week: Not on file    Minutes per session: Not on file  . Stress: Not on file  Relationships  . Social connections:    Talks on phone: Not on file    Gets together: Not on file    Attends religious service: Not on file    Active member of club or organization: Not on file    Attends meetings of clubs or organizations: Not on file    Relationship status: Not on file  . Intimate partner violence:    Fear of current or ex partner: Not on file     Emotionally abused: Not on file    Physically abused: Not on file    Forced sexual activity: Not on file  Other Topics Concern  . Not on file  Social History Narrative  . Not on file  :  Pertinent items are noted in HPI.  Exam: As above Patient Vitals for the past 24 hrs:  BP Temp Temp src Pulse Resp SpO2  01/24/18 0300 100/66 99.1 F (37.3 C) Oral 91 (!) 22 98 %  01/23/18 2300 122/65 98.7 F (37.1 C) Oral 86 (!) 23 98 %  01/23/18 1900 116/67 98.9 F (37.2 C) Oral 86 (!) 30 98 %  01/23/18 1614 (!) 119/59 98.8 F (37.1 C) Oral 85 (!) 30 99 %  01/23/18 1154 122/74 97.6 F (36.4 C) Oral 89 (!) 23 100 %  01/23/18 0820 (!) 119/48 98.4 F (36.9 C) Oral (!) 51 20 99 %     Recent Labs    01/22/18 0401 01/23/18 0534  WBC 10.0 10.0  HGB 9.1* 8.7*  HCT 29.3* 28.5*  PLT 151 144*   Recent Labs    01/22/18 0401 01/23/18 0534  NA 139 137  K 4.3 4.2  CL 108 107  CO2 21* 22  GLUCOSE 101* 85  BUN 20 20  CREATININE 1.32* 1.42*  CALCIUM 8.5* 8.3*    Blood smear review: None  Pathology: None    Assessment and Plan: Barry Curry is a 77 year old  white male.  He has a stage IIIa clear cell carcinoma of the left kidney.  He had a radical nephrectomy back in January.  I do worry that the pleural fluid is potentially malignant.  We will have to see what the cytology shows.  If the cytology is negative, and the fluid comes back again, then I would definitely consider him for a thoracoscopic drainage with pleurodesis being done.  Again, I think this all comes down to whether or not the fluid is positive.  If it is positive, then he clearly has metastatic disease.  The fluid could be cardiac in nature.  He does have significant cardiac issues.  We will just have to see what his cytology shows.  May be able to be back today.  He is quite anemic.  I will have to check his iron studies.  I suspect his erythropoietin level will be low.  If so, then I would consider him for  Aranesp.  If his iron studies are low, then we will give him some IV iron.  I just feel bad for him.  I just want him to have a better quality of life.  We will follow along and help by any way that we can.  I appreciate everybody's help with Barry Curry on 2 W.   Lattie Haw, MD  2 Christia [place] 2:15

## 2018-01-25 DIAGNOSIS — E611 Iron deficiency: Secondary | ICD-10-CM

## 2018-01-25 DIAGNOSIS — I499 Cardiac arrhythmia, unspecified: Secondary | ICD-10-CM

## 2018-01-25 DIAGNOSIS — R000 Tachycardia, unspecified: Secondary | ICD-10-CM

## 2018-01-25 DIAGNOSIS — N289 Disorder of kidney and ureter, unspecified: Secondary | ICD-10-CM

## 2018-01-25 LAB — CBC WITH DIFFERENTIAL/PLATELET
Abs Immature Granulocytes: 0.1 10*3/uL (ref 0.0–0.1)
BASOS PCT: 0 %
Basophils Absolute: 0 10*3/uL (ref 0.0–0.1)
EOS ABS: 0.2 10*3/uL (ref 0.0–0.7)
Eosinophils Relative: 2 %
HCT: 27.7 % — ABNORMAL LOW (ref 39.0–52.0)
Hemoglobin: 8.7 g/dL — ABNORMAL LOW (ref 13.0–17.0)
IMMATURE GRANULOCYTES: 1 %
Lymphocytes Relative: 7 %
Lymphs Abs: 0.8 10*3/uL (ref 0.7–4.0)
MCH: 26.9 pg (ref 26.0–34.0)
MCHC: 31.4 g/dL (ref 30.0–36.0)
MCV: 85.8 fL (ref 78.0–100.0)
MONOS PCT: 11 %
Monocytes Absolute: 1.2 10*3/uL — ABNORMAL HIGH (ref 0.1–1.0)
NEUTROS PCT: 79 %
Neutro Abs: 8.4 10*3/uL — ABNORMAL HIGH (ref 1.7–7.7)
PLATELETS: 178 10*3/uL (ref 150–400)
RBC: 3.23 MIL/uL — ABNORMAL LOW (ref 4.22–5.81)
RDW: 17.9 % — AB (ref 11.5–15.5)
WBC: 10.7 10*3/uL — AB (ref 4.0–10.5)

## 2018-01-25 LAB — COMPREHENSIVE METABOLIC PANEL
ALT: 12 U/L (ref 0–44)
ANION GAP: 7 (ref 5–15)
AST: 17 U/L (ref 15–41)
Albumin: 2.6 g/dL — ABNORMAL LOW (ref 3.5–5.0)
Alkaline Phosphatase: 66 U/L (ref 38–126)
BUN: 19 mg/dL (ref 8–23)
CO2: 23 mmol/L (ref 22–32)
Calcium: 8.4 mg/dL — ABNORMAL LOW (ref 8.9–10.3)
Chloride: 107 mmol/L (ref 98–111)
Creatinine, Ser: 1.35 mg/dL — ABNORMAL HIGH (ref 0.61–1.24)
GFR, EST AFRICAN AMERICAN: 57 mL/min — AB (ref >60)
GFR, EST NON AFRICAN AMERICAN: 49 mL/min — AB (ref >60)
Glucose, Bld: 81 mg/dL (ref 70–99)
POTASSIUM: 4.1 mmol/L (ref 3.5–5.1)
Sodium: 137 mmol/L (ref 135–145)
Total Bilirubin: 0.6 mg/dL (ref 0.3–1.2)
Total Protein: 5.3 g/dL — ABNORMAL LOW (ref 6.5–8.1)

## 2018-01-25 LAB — ERYTHROPOIETIN: Erythropoietin: 31.2 m[IU]/mL — ABNORMAL HIGH (ref 2.6–18.5)

## 2018-01-25 MED ORDER — MORPHINE SULFATE (PF) 2 MG/ML IV SOLN
1.0000 mg | Freq: Once | INTRAVENOUS | Status: AC
  Administered 2018-01-25: 1 mg via INTRAVENOUS
  Filled 2018-01-25: qty 1

## 2018-01-25 MED ORDER — FERROUS SULFATE 325 (65 FE) MG PO TABS: 325.0000 mg | ORAL_TABLET | Freq: Two times a day (BID) | ORAL | Status: DC

## 2018-01-25 MED ORDER — FERROUS SULFATE 325 (65 FE) MG PO TABS
325.0000 mg | ORAL_TABLET | Freq: Every day | ORAL | Status: DC
  Administered 2018-01-25 – 2018-02-07 (×13): 325 mg via ORAL
  Filled 2018-01-25 (×13): qty 1

## 2018-01-25 NOTE — Progress Notes (Signed)
PCCM Interval Note  Await pleural fluid cytology.  If unrevealing may need PET to help guide further diagnostics.  If pleural fluid re-accumulates then ? Pleuroscopy / pleurodesis with TCTS.   Please call if we can assist.   Baltazar Apo, MD, PhD 01/25/2018, 9:37 AM [facility] [phone] or if no answer [phone]

## 2018-01-25 NOTE — NC FL2 (Signed)
[place] LEVEL OF CARE SCREENING TOOL     IDENTIFICATION  Patient Name: Barry Curry Birthdate: [birth year] Sex: male Admission Date (Current Location): 01/21/2018  [facility] and [place] Number:  Herbalist and Address:  [facility] [place], [place] [address] [place], [place] [address]      Provider Number: [id number]  Attending Physician Name and Address:  Kayleen Memos, DO  Relative Name and Phone Number:       Current Level of Care: Hospital Recommended Level of Care: [place] Prior Approval Number:    Date Approved/Denied:   PASRR Number: [id number]  Discharge Plan: SNF    Current Diagnoses: Patient Active Problem List   Diagnosis Date Noted  . Malnutrition of moderate degree 01/24/2018  . Recurrent right pleural effusion 01/21/2018  . Anxiety 01/21/2018  . CAD (coronary artery disease) 01/21/2018  . Iron deficiency anemia 01/21/2018  . Elevated troponin 01/21/2018  . CKD (chronic kidney disease), stage III ([place]) 01/21/2018  . Clear cell carcinoma of kidney, left ([place]) 11/17/2017  . Goals of care, counseling/discussion 11/17/2017  . Kidney mass 07/23/2017  . Acute respiratory failure with hypoxia ([place]) 07/23/2017  . Subcutaneous emphysema ([place]) 07/23/2017  . PAF (paroxysmal atrial fibrillation) ([place]) 07/23/2017  . History of pulmonary embolism 07/23/2017  . Essential hypertension 03/02/2007  . GERD 03/02/2007    Orientation RESPIRATION BLADDER Height & Weight     Self, Time, Situation, Place  Normal Continent Weight: 203 lb (92.1 kg) Height:  6' (182.9 cm)  BEHAVIORAL SYMPTOMS/MOOD NEUROLOGICAL BOWEL NUTRITION STATUS      Continent Diet(cardiac)  AMBULATORY STATUS COMMUNICATION OF NEEDS Skin   Limited Assist Verbally Normal                       Personal Care Assistance Level of Assistance  Bathing, Dressing Bathing Assistance: Limited assistance   Dressing Assistance: Limited  assistance     Functional Limitations Info             SPECIAL CARE FACTORS FREQUENCY  PT (By licensed PT), OT (By licensed OT)     PT Frequency: 5/wk OT Frequency: 5/wk            Contractures      Additional Factors Info  Code Status, Allergies Code Status Info: FULL Allergies Info: Penicillins           Current Medications (01/25/2018):  This is the current hospital active medication list Current Facility-Administered Medications  Medication Dose Route Frequency Provider Last Rate Last Dose  . 0.9 %  sodium chloride infusion   Intravenous Continuous Kayleen Memos, DO 75 mL/hr at 01/25/18 1149    . acetaminophen (TYLENOL) tablet 500 mg  500 mg Oral Q6H PRN Ivor Costa, MD      . dextromethorphan-guaiFENesin [facility] DM) 30-600 MG per 12 hr tablet 1 tablet  1 tablet Oral BID PRN Ivor Costa, MD      . diazepam (VALIUM) tablet 5 mg  5 mg Oral Q12H PRN Ivor Costa, MD      . diclofenac sodium (VOLTAREN) 1 % transdermal gel 4 g  4 g Topical QID PRN Ivor Costa, MD      . dronedarone (MULTAQ) tablet 400 mg  400 mg Oral BID WC Ivor Costa, MD   400 mg at 01/25/18 0827  . feeding supplement (ENSURE ENLIVE) (ENSURE ENLIVE) liquid 237 mL  237 mL Oral BID BM Kayleen Memos, DO  237 mL at 01/25/18 1351  . ferrous sulfate tablet 325 mg  325 mg Oral Q breakfast Kayleen Memos, DO   325 mg at 01/25/18 0827  . fluticasone (FLONASE) 50 MCG/ACT nasal spray 2 spray  2 spray Each Nare QHS Ivor Costa, MD   2 spray at 01/24/18 2206  . hydrALAZINE (APRESOLINE) injection 5 mg  5 mg Intravenous Q2H PRN Ivor Costa, MD      . levalbuterol Penne Lash) nebulizer solution 1.25 mg  1.25 mg Nebulization Q6H PRN Irene Pap N, DO      . lipase/protease/amylase (CREON) capsule 72,000 Units  72,000 Units Oral BID Renella Cunas, MD   72,000 Units at 01/25/18 1201  . loperamide (IMODIUM) capsule 2 mg  2 mg Oral PRN Ivor Costa, MD      . MEDLINE mouth rinse  15 mL Mouth Rinse BID Hall, Carole N, DO   15 mL at  01/25/18 0930  . metoprolol succinate (TOPROL-XL) 24 hr tablet 25 mg  25 mg Oral Daily Ivor Costa, MD   25 mg at 01/25/18 0930  . ondansetron (ZOFRAN) tablet 4 mg  4 mg Oral Q6H PRN Ivor Costa, MD       Or  . ondansetron [facility]) injection 4 mg  4 mg Intravenous Q6H PRN Ivor Costa, MD      . pantoprazole (PROTONIX) EC tablet 40 mg  40 mg Oral Daily Ivor Costa, MD   40 mg at 01/25/18 0930  . tamsulosin (FLOMAX) capsule 0.4 mg  0.4 mg Oral QPC supper Ivor Costa, MD   0.4 mg at 01/24/18 1803  . vitamin B-6 (pyridOXINE) tablet 25 mg  25 mg Oral BID Ivor Costa, MD   25 mg at 01/25/18 0930  . zolpidem (AMBIEN) tablet 5 mg  5 mg Oral QHS PRN Ivor Costa, MD   5 mg at 01/24/18 2207     Discharge Medications: Please see discharge summary for a list of discharge medications.  Relevant Imaging Results:  Relevant Lab Results:   Additional Information SS#: [id number]  Jorge Ny, LCSW

## 2018-01-25 NOTE — Evaluation (Signed)
Physical Therapy Evaluation Patient Details Name: Barry Curry MRN: [id number] DOB: Feb 04, 1941 Today's Date: 01/25/2018   History of Present Illness  77 y.o. male with medical history significant of hypertension, hyperlipidemia, GERD, depression, anxiety, atrial fibrillation not on anticoagulants, CAD, PVC, aortic stenosis, right bundle blockage, iron deficiency anemia, CKD, clear renal cell cancer (left nephrectomy), melanoma, who presents with shortness of breath. CT reveals Large RIGHT pleural effusion with RIGHT pleural/chest wall nodules. New RIGHT internal mammary lymphadenopathy, small pulmonary nodules and 2.3 cm RIGHT adrenal nodule are highly suspicious for metastatic disease. on 01/22/2018 patient had a ultrasound-guided right thoracentesis with 2 L of bloody fluid removed.    Clinical Impression  PTA pt living with daughter in home with 2 flights of stairs, 1 flight into home and 1 flight to bedroom and bathroom. Pt having increasing difficulty with climbing stairs, requiring seated rest breaks at landings. Pt ambulating around home with cane and supervision, requiring assist for bathing and dressing himself. Pt currently limited in safe mobility by increased RR with ambulation, and decreased strength and endurance. Pt currently min guard for bed mobility, transfer with RW and household level ambulation with RW. PT currently recommends SNF level rehab at d/c. PT will continue to follow acutely.     Follow Up Recommendations SNF    Equipment Recommendations  Other (comment)(TBD at next venue)    Recommendations for Other Services       Precautions / Restrictions Precautions Precautions: Fall Restrictions Weight Bearing Restrictions: No      Mobility  Bed Mobility Overal bed mobility: Needs Assistance Bed Mobility: Supine to Sit     Supine to sit: Min guard     General bed mobility comments: min guard for safety, increased time and effort  Transfers Overall  transfer level: Needs assistance Equipment used: Rolling walker (2 wheeled) Transfers: Sit to/from Stand Sit to Stand: Min guard         General transfer comment: min guard for safety, good power up and steadying, slight dizziness on standing, quickly dissipated  Ambulation/Gait Ambulation/Gait assistance: Min guard Gait Distance (Feet): 100 Feet(2x100 feet) Assistive device: Rolling walker (2 wheeled) Gait Pattern/deviations: Narrow base of support;Trunk flexed;Step-through pattern;Decreased stride length;Shuffle Gait velocity: slowed Gait velocity interpretation: <1.31 ft/sec, indicative of household ambulator General Gait Details: hands on min guard for safety, no overt LoB, vc for increasing BoS, upright posture and increased foot clearance with swing.         Balance Overall balance assessment: Needs assistance Sitting-balance support: Feet unsupported;No upper extremity supported Sitting balance-Leahy Scale: Good     Standing balance support: Bilateral upper extremity supported Standing balance-Leahy Scale: Fair                               Pertinent Vitals/Pain Pain Assessment: No/denies pain    Home Living Family/patient expects to be discharged to:: Private residence Living Arrangements: Children;Other relatives(daughters and sister) Available Help at Discharge: Family;Available PRN/intermittently Type of Home: House Home Access: Stairs to enter Entrance Stairs-Rails: Left Entrance Stairs-Number of Steps: 1 flight Home Layout: Two level;Bed/bath upstairs Home Equipment: Walker - 2 wheels;Cane - single point      Prior Function Level of Independence: Needs assistance   Gait / Transfers Assistance Needed: needs assist with steps, ambulates household distance   ADL's / Homemaking Assistance Needed: requires assist for bathing, able to dress with increased effort  Extremity/Trunk Assessment   Upper Extremity Assessment Upper  Extremity Assessment: Generalized weakness    Lower Extremity Assessment Lower Extremity Assessment: Generalized weakness    Cervical / Trunk Assessment Cervical / Trunk Assessment: Kyphotic;Other exceptions(decreased neck ROM )  Communication   Communication: No difficulties  Cognition Arousal/Alertness: Awake/alert Behavior During Therapy: WFL for tasks assessed/performed Overall Cognitive Status: Within Functional Limits for tasks assessed                                        General Comments General comments (skin integrity, edema, etc.): prior to activity HR 93 bpm, RR 25, with ambulation max HR 115 bpm, RR 45, SaO2 on RA throughout session >90%O2, sister present throughout session         Assessment/Plan    PT Assessment Patient needs continued PT services  PT Problem List Decreased strength;Decreased activity tolerance;Decreased mobility;Cardiopulmonary status limiting activity       PT Treatment Interventions DME instruction;Gait training;Stair training;Functional mobility training;Therapeutic activities;Therapeutic exercise;Balance training;Cognitive remediation;Patient/family education    PT Goals (Current goals can be found in the Care Plan section)  Acute Rehab PT Goals Patient Stated Goal: go home to [facility] PT Goal Formulation: With patient Time For Goal Achievement: 02/08/18 Potential to Achieve Goals: Fair    Frequency Min 2X/week   Barriers to discharge Inaccessible home environment         AM-PAC PT "6 Clicks" Daily Activity  Outcome Measure Difficulty turning over in bed (including adjusting bedclothes, sheets and blankets)?: A Little Difficulty moving from lying on back to sitting on the side of the bed? : Unable Difficulty sitting down on and standing up from a chair with arms (e.g., wheelchair, bedside commode, etc,.)?: Unable Help needed moving to and from a bed to chair (including a wheelchair)?: A Little Help needed  walking in hospital room?: A Little Help needed climbing 3-5 steps with a railing? : A Lot 6 Click Score: 13    End of Session Equipment Utilized During Treatment: Gait belt Activity Tolerance: Patient tolerated treatment well Patient left: in chair;with call bell/phone within reach;with family/visitor present Nurse Communication: Mobility status;Other (comment)(need for ambulation with each shift) PT Visit Diagnosis: Unsteadiness on feet (R26.81);Other abnormalities of gait and mobility (R26.89);Muscle weakness (generalized) (M62.81);Difficulty in walking, not elsewhere classified (R26.2)    Time: 2774-1287 PT Time Calculation (min) (ACUTE ONLY): 36 min   Charges:   PT Evaluation $PT Eval Moderate Complexity: 1 Mod PT Treatments $Gait Training: 8-22 mins        Karizma Cheek B. Migdalia Dk PT, DPT Acute Rehabilitation  [phone] Pager [phone]    [place] 01/25/2018, 11:33 AM

## 2018-01-25 NOTE — Progress Notes (Signed)
So far, the pathology is not back on the pleural fluid.  Hopefully, it will not be malignant.  He still is having cardiac arrhythmia issues.  He is somewhat tachycardic this morning.  His labs show his hemoglobin to be down to 8.7.  White cell count 10.7 and platelet count 178,000.  His sodium is 137.  Potassium 4.1.  BUN 19 creatinine 1.35.  His albumin is on the low side at 2.6.  He is definitely iron deficient.  I will give him a dose of IV iron today.  He does have some mild renal insufficiency.  As such, I suspect that he probably has a low erythropoietin level.  His appetite is still not that great.  He has had no nausea or vomiting.  I really cannot find anything new on his physical exam.  He is somewhat tachycardic with occasional extra beats.  The IV iron, if it works, probably would not help for at least a couple weeks.  With Barry Curry, if his hemoglobin drops, that she may need to be transfused.  Hopefully, with his erythropoietin level will be back soon.  We may need to give him a dose of Aranesp.  Hopefully, the cytology will be back on the fluid.  Barry Haw, MD  Barry Curry 12:2

## 2018-01-25 NOTE — Progress Notes (Signed)
PROGRESS NOTE  Barry Curry XLK:[id number] DOB: 1941/06/07 DOA: 01/21/2018 PCP: [name], PA-C  HPI/Recap of past 24 hours: Barry Curry is a 77 y.o. male with medical history significant of hypertension, hyperlipidemia, GERD, depression, anxiety, atrial fibrillation not on anticoagulant, CAD, PVC, aortic stenosis, RBBB, iron deficiency anemia, CKD, clear renal cell cancer (left nephrectomy), melanoma, OSA who presents with shortness of breath.  3 weeks prior had a right thoracentesis at [facility] with 670 cc of bloody fluid removed.  At this admission on 01/22/2018 patient had a ultrasound-guided right thoracentesis with 2 L of bloody fluid removed.  Pleural fluid sent for Gram stain, culture, cytology.  Pulmonology consulted to assess for recurrence of pleural effusion.  Exudative pleural fluid.  Gram stain negative.  Cytology still pending.  01/25/2018: Patient seen and examined at his bedside.  He has no new complaints.  He denies chest pain, dyspnea, or palpitations.  Afebrile with no leukocytosis.  Gram stain negative.  Awaiting results of cytology.     Assessment/Plan: Principal Problem:   Recurrent right pleural effusion Active Problems:   Essential hypertension   GERD   Acute respiratory failure with hypoxia (HCC)   PAF (paroxysmal atrial fibrillation) (HCC)   Clear cell carcinoma of kidney, left (HCC)   Anxiety   CAD (coronary artery disease)   Iron deficiency anemia   Elevated troponin   CKD (chronic kidney disease), stage III (HCC)   Malnutrition of moderate degree  Acute hypoxic respiratory failure most likely secondary to recurrent right pleural effusion, stable Post right thoracentesis with 2 L of bloody fluid removed Had a right thoracentesis done 3 weeks ago at [facility] where 670 cc of bloody fluid yielded Exudative fluid.  Gram stain negative.  Cytology pending. Continue O2 supplementation to maintain O2 saturation greater than  92% Continue nebs Continue pulmonary toilet for nonproductive cough   Recurrent right pleural effusion post right thoracentesis on 01/22/2018 Recurrent right pleural effusion post thoracentesis appears stable at this time Ultrasound-guided right thoracentesis with 2 L of bloody fluid yielded Body fluid Gram stain, cytology, and cultures pending Chest x-ray post thoracentesis personally reviewed unremarkable for any acute findings.  Iron deficiency anemia/acute blood loss anemia 2 L of bloody fluid removed from right thoracentesis done on 01/22/2018 Hemoglobin stable at 9.5 from 8.7 No overt sign of bleeding Continue p.o. ferrous sulfate  Right pleural/chest wall nodules Rule out malignancy Awaiting cytology results  New right internal mammary lymphadenopathy/small pulmonary nodules Suspicious for metastatic disease  2.3 cm right adrenal nodule Per CT angio chest with or without contrast done on 01/21/2018 If cytology is negative we will obtain PET/CT scan to direct biopsies.    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 chemotherapy due to side effect Awaiting results of pleural fluid cytology to determine if metastatic disease  CKD 3 in the setting of single kidney, worsening GFR 50 Creatinine 1.35 from 1.29 yesterday Started gentle IV hydration Continue Avoid dehydration/avoid nephrotoxic agents Repeat BMP in the morning  Leukocytosis, resolved WBC 10 from 12 Repeat CBC in am  Moderate protein calorie malnutrition BMI 27 Albumin 2.9 Encourage increase of oral protein calorie intake  Hypertension Blood pressure stable      Code Status: Full code  Family Communication: Daughter [name] at bedside [phone].  Updated on current condition and management on 01/23/2018.  Disposition Plan: Home  possibly in 1 to 2 days when cytology  results return.   Consultants:  Pulmonology  Procedures:  Right thoracentesis 01/22/2018  Antimicrobials:  None  DVT prophylaxis: Subcu Lovenox daily   Objective: Vitals:   01/25/18 0400 01/25/18 0730 01/25/18 0900 01/25/18 1202  BP: 99/62 130/67  129/74  Pulse: 79 96 (!) 103 90  Resp: (!) 27 (!) 25 (!) 25 (!) 36  Temp: 98.3 F (36.8 C) 98.1 F (36.7 C)  97.7 F (36.5 C)  TempSrc: Oral Oral  Oral  SpO2: 96% 96% 96% 96%  Weight:      Height:        Intake/Output Summary (Last 24 hours) at 01/25/2018 1439 Last data filed at 01/25/2018 1408 Gross per 24 hour  Intake 2643.76 ml  Output 1200 ml  Net 1443.76 ml   Filed Weights   01/21/18 1107  Weight: 92.1 kg (203 lb)    Exam:  . General: 77 y.o. year-old male well-developed well-nourished.  No acute distress.  Alert and oriented x3.  Cardiovascular: Regular rate and rhythm with no rubs or gallops.  No thyromegaly or JVD noted. Marland Kitchen Respiratory: Rales diffusely and bilaterally. Good inspiratory effort. . Abdomen: Soft nontender nondistended with normal bowel sounds x4 quadrants. . Musculoskeletal: No lower extremity edema. 2/4 pulses in all 4 extremities. Marland Kitchen Psychiatry: Mood is appropriate for condition and setting   Data Reviewed: CBC: Recent Labs  Lab 01/21/18 1142 01/22/18 0401 01/23/18 0534 01/24/18 0752 01/25/18 0224  WBC 10.2 10.0 10.0 12.2* 10.7*  NEUTROABS 8.0*  --   --  9.4* 8.4*  HGB 10.1* 9.1* 8.7* 9.5* 8.7*  HCT 30.8* 29.3* 28.5* 31.1* 27.7*  MCV 84.6 87.2 88.0 88.9 85.8  PLT 155 151 144* 182 010   Basic Metabolic Panel: Recent Labs  Lab 01/21/18 1142 01/22/18 0401 01/23/18 0534 01/24/18 0752 01/25/18 0224  NA 134* 139 137 135 137  K 4.5 4.3 4.2 4.3 4.1  CL 104 108 107 103 107  CO2 24 21* 22 19* 23  GLUCOSE 117* 101* 85 86 81  BUN 23 20 20 18 19   CREATININE 1.53* 1.32* 1.42* 1.29* 1.35*  CALCIUM 8.6* 8.5* 8.3* 8.8* 8.4*   GFR: Estimated Creatinine Clearance: 50.3 mL/min (A) (by  C-G formula based on SCr of 1.35 mg/dL (H)). Liver Function Tests: Recent Labs  Lab 01/21/18 1142 01/24/18 0752 01/25/18 0224  AST 22 22 17   ALT 13 14 12   ALKPHOS 82 73 66  BILITOT 0.4 0.8 0.6  PROT 6.6 5.5* 5.3*  ALBUMIN 3.3* 2.9* 2.6*   No results for input(s): LIPASE, AMYLASE in the last 168 hours. No results for input(s): AMMONIA in the last 168 hours. Coagulation Profile: Recent Labs  Lab 01/21/18 1142  INR 1.08   Cardiac Enzymes: Recent Labs  Lab 01/21/18 1142 01/21/18 2225 01/22/18 0401 01/22/18 1001  TROPONINI 0.03* <0.03 0.03* <0.03   BNP (last 3 results) No results for input(s): PROBNP in the last 8760 hours. HbA1C: No results for input(s): HGBA1C in the last 72 hours. CBG: No results for input(s): GLUCAP in the last 168 hours. Lipid Profile: No results for input(s): CHOL, HDL, LDLCALC, TRIG, CHOLHDL, LDLDIRECT in the last 72 hours. Thyroid Function Tests: No results for input(s): TSH, T4TOTAL, FREET4, T3FREE, THYROIDAB in the last 72 hours. Anemia Panel: Recent Labs    01/24/18 0752  FERRITIN 116  TIBC 256  IRON 40*   Urine analysis:    Component Value Date/Time   COLORURINE YELLOW 01/21/2018 1432  APPEARANCEUR CLEAR 01/21/2018 1432   LABSPEC <1.005 (L) 01/21/2018 1432   PHURINE 5.5 01/21/2018 1432   GLUCOSEU NEGATIVE 01/21/2018 1432   HGBUR NEGATIVE 01/21/2018 1432   BILIRUBINUR NEGATIVE 01/21/2018 1432   KETONESUR NEGATIVE 01/21/2018 1432   PROTEINUR NEGATIVE 01/21/2018 1432   UROBILINOGEN 1.0 03/21/2010 1348   NITRITE NEGATIVE 01/21/2018 1432   LEUKOCYTESUR NEGATIVE 01/21/2018 1432   Sepsis Labs: @LABRCNTIP (procalcitonin:4,lacticidven:4)  ) Recent Results (from the past 240 hour(s))  Culture, blood (Routine x 2)     Status: None (Preliminary result)   Collection Time: 01/21/18 11:40 AM  Result Value Ref Range Status   Specimen Description   Final    BLOOD RIGHT ARM Performed at [facility], [address]  [place], [place] [address]    Special Requests   Final    BOTTLES DRAWN AEROBIC AND ANAEROBIC Blood Culture adequate volume Performed at [facility] [address] [place], [place] [address]    Culture   Final    NO GROWTH 3 DAYS Performed at [facility], [place] [address] [place], [place] [address]    Report Status PENDING  Incomplete  Culture, blood (Routine x 2)     Status: None (Preliminary result)   Collection Time: 01/21/18 11:50 AM  Result Value Ref Range Status   Specimen Description   Final    BLOOD LEFT ARM Performed at [facility], [address] [place], [place] [address]    Special Requests   Final    BOTTLES DRAWN AEROBIC AND ANAEROBIC Blood Culture results may not be optimal due to an inadequate volume of blood received in culture bottles Performed at [facility], [address] [place], [place] [address]    Culture   Final    NO GROWTH 3 DAYS Performed at [facility], [place] [address] [place], [place] [address]    Report Status PENDING  Incomplete  Urine culture     Status: Abnormal   Collection Time: 01/21/18  2:32 PM  Result Value Ref Range Status   Specimen Description   Final    URINE, CLEAN CATCH Performed at [facility], [address] [place],  [address]    Special Requests   Final    NONE Performed at [facility], [address] [place], [place] [address]    Culture 20,000 COLONIES/mL PSEUDOMONAS AERUGINOSA (A)  Final   Report Status 01/24/2018 FINAL  Final   Organism ID, Bacteria PSEUDOMONAS AERUGINOSA (A)  Final      Susceptibility   Pseudomonas aeruginosa - MIC*    CEFTAZIDIME 2 SENSITIVE Sensitive     CIPROFLOXACIN 1 SENSITIVE Sensitive     GENTAMICIN 4 SENSITIVE Sensitive     IMIPENEM 2 SENSITIVE Sensitive     PIP/TAZO <=4 SENSITIVE Sensitive     CEFEPIME 8 SENSITIVE Sensitive     * 20,000 COLONIES/mL PSEUDOMONAS AERUGINOSA  MRSA PCR Screening     Status: None     Collection Time: 01/21/18  8:47 PM  Result Value Ref Range Status   MRSA by PCR NEGATIVE NEGATIVE Final    Comment:        The GeneXpert MRSA Assay (FDA approved for NASAL specimens only), is one component of a comprehensive MRSA colonization surveillance program. It is not intended to diagnose MRSA infection nor to guide or monitor treatment for MRSA infections. Performed at [facility], [address] [place], [place] [address]   Culture, body fluid-bottle     Status: None (Preliminary result)   Collection Time: 01/22/18  2:59 PM  Result Value Ref Range Status   Specimen Description PLEURAL RIGHT  Final   Special Requests NONE  Final   Culture   Final    NO GROWTH 2 DAYS Performed at [facility] [address], [place], [place] [address]    Report Status PENDING  Incomplete  Gram stain     Status: None   Collection Time: 01/22/18  2:59 PM  Result Value Ref Range Status   Specimen Description PLEURAL RIGHT  Final   Special Requests NONE  Final   Gram Stain   Final    FEW WBC PRESENT, PREDOMINANTLY MONONUCLEAR NO ORGANISMS SEEN Performed at [facility], [address], [place], [place] [address]    Report Status 01/22/2018 FINAL  Final      Studies: No results found.  Scheduled Meds: . dronedarone  400 mg Oral BID WC  . feeding supplement (ENSURE ENLIVE)  237 mL Oral BID BM  . ferrous sulfate  325 mg Oral Q breakfast  . fluticasone  2 spray Each Nare QHS  . lipase/protease/amylase  72,000 Units Oral BID AC  . mouth rinse  15 mL Mouth Rinse BID  . metoprolol succinate  25 mg Oral Daily  . pantoprazole  40 mg Oral Daily  . tamsulosin  0.4 mg Oral QPC supper  . vitamin B-6  25 mg Oral BID    Continuous Infusions: . sodium chloride 75 mL/hr at 01/25/18 1149     LOS: 3 days     Kayleen Memos, MD [facility] Pager [phone]  If 7PM-7AM, please contact night-coverage [email or web address] Password [email or web address] 01/25/2018, 2:39 PM

## 2018-01-25 NOTE — Clinical Social Work Note (Signed)
Clinical Social Work Assessment  Patient Details  Name: Barry Curry MRN: [id number] Date of Birth: February 06, 1941  Date of referral:  01/25/18               Reason for consult:  Facility Placement                Permission sought to share information with:  Chartered certified accountant granted to share information::  Yes, Verbal Permission Granted  Name::     Barry Curry  Agency::  SNF  Relationship::  sister, dtr  Contact Information:     Housing/Transportation Living arrangements for the past 2 months:  Single Family Home Source of Information:  Patient Patient Interpreter Needed:  None Criminal Activity/Legal Involvement Pertinent to Current Situation/Hospitalization:  No - Comment as needed Significant Relationships:  Adult Children, Siblings Lives with:  Adult Children Do you feel safe going back to the place where you live?  No Need for family participation in patient care:  Yes (Comment)(lives with dtr who helps with iADLs)  Care giving concerns:  Pt lives at home with adult dtr who works during the day.  Pt normally independent at home but has become weaker recently and having trouble getting up and down stairs at home.   Social Worker assessment / plan:  CSW met with pt and discussed PT recommendation for SNF.  CSW explained SNF and SNF referral process.  Employment status:  Retired Forensic scientist:  Medicare PT Recommendations:  [place] / Referral to community resources:  [place]  Patient/Family's Response to care:  Pt willing to consider SNF depending on how he feels at DC.  Patient/Family's Understanding of and Emotional Response to Diagnosis, Current Treatment, and Prognosis:  Pt seems to have good understanding of current condition- pt feeling somewhat anxious awaiting test results as they could reveal cancer.  Emotional Assessment Appearance:  Appears stated age Attitude/Demeanor/Rapport:     Affect (typically observed):  Appropriate Orientation:  Oriented to Self, Oriented to Place, Oriented to  Time, Oriented to Situation Alcohol / Substance use:  Not Applicable Psych involvement (Current and /or in the community):  No (Comment)  Discharge Needs  Concerns to be addressed:  Care Coordination Readmission within the last 30 days:  No Current discharge risk:  Physical Impairment Barriers to Discharge:  Continued Medical Work up   Jorge Ny, LCSW 01/25/2018, 3:32 PM

## 2018-01-26 ENCOUNTER — Inpatient Hospital Stay (HOSPITAL_COMMUNITY): Payer: Medicare Other

## 2018-01-26 ENCOUNTER — Encounter (HOSPITAL_COMMUNITY): Payer: Self-pay | Admitting: Physician Assistant

## 2018-01-26 HISTORY — PX: IR THORACENTESIS ASP PLEURAL SPACE W/IMG GUIDE: IMG5380

## 2018-01-26 LAB — CBC WITH DIFFERENTIAL/PLATELET
Abs Immature Granulocytes: 0.2 10*3/uL — ABNORMAL HIGH (ref 0.0–0.1)
Basophils Absolute: 0 10*3/uL (ref 0.0–0.1)
Basophils Relative: 0 %
EOS ABS: 0 10*3/uL (ref 0.0–0.7)
EOS PCT: 0 %
HEMATOCRIT: 29 % — AB (ref 39.0–52.0)
HEMOGLOBIN: 9.1 g/dL — AB (ref 13.0–17.0)
Immature Granulocytes: 1 %
LYMPHS ABS: 0.6 10*3/uL — AB (ref 0.7–4.0)
LYMPHS PCT: 2 %
MCH: 26.8 pg (ref 26.0–34.0)
MCHC: 31.4 g/dL (ref 30.0–36.0)
MCV: 85.5 fL (ref 78.0–100.0)
MONO ABS: 1.2 10*3/uL — AB (ref 0.1–1.0)
MONOS PCT: 5 %
Neutro Abs: 20.9 10*3/uL — ABNORMAL HIGH (ref 1.7–7.7)
Neutrophils Relative %: 92 %
Platelets: 216 10*3/uL (ref 150–400)
RBC: 3.39 MIL/uL — ABNORMAL LOW (ref 4.22–5.81)
RDW: 17.6 % — AB (ref 11.5–15.5)
WBC: 22.9 10*3/uL — ABNORMAL HIGH (ref 4.0–10.5)

## 2018-01-26 LAB — COMPREHENSIVE METABOLIC PANEL
ALBUMIN: 2.7 g/dL — AB (ref 3.5–5.0)
ALK PHOS: 67 U/L (ref 38–126)
ALT: 15 U/L (ref 0–44)
AST: 19 U/L (ref 15–41)
Anion gap: 7 (ref 5–15)
BILIRUBIN TOTAL: 0.7 mg/dL (ref 0.3–1.2)
BUN: 21 mg/dL (ref 8–23)
CO2: 23 mmol/L (ref 22–32)
Calcium: 8.7 mg/dL — ABNORMAL LOW (ref 8.9–10.3)
Chloride: 107 mmol/L (ref 98–111)
Creatinine, Ser: 1.29 mg/dL — ABNORMAL HIGH (ref 0.61–1.24)
GFR calc Af Amer: >60 mL/min (ref >60)
GFR calc non Af Amer: 52 mL/min — ABNORMAL LOW (ref >60)
GLUCOSE: 149 mg/dL — AB (ref 70–99)
POTASSIUM: 4.4 mmol/L (ref 3.5–5.1)
Sodium: 137 mmol/L (ref 135–145)
TOTAL PROTEIN: 5.5 g/dL — AB (ref 6.5–8.1)

## 2018-01-26 LAB — HEMOGLOBIN AND HEMATOCRIT, BLOOD
HEMATOCRIT: 28.1 % — AB (ref 39.0–52.0)
Hemoglobin: 8.9 g/dL — ABNORMAL LOW (ref 13.0–17.0)

## 2018-01-26 LAB — CULTURE, BLOOD (ROUTINE X 2)
Culture: NO GROWTH
Culture: NO GROWTH
Special Requests: ADEQUATE

## 2018-01-26 LAB — ERYTHROPOIETIN: Erythropoietin: 28.8 m[IU]/mL — ABNORMAL HIGH (ref 2.6–18.5)

## 2018-01-26 LAB — LACTIC ACID, PLASMA
LACTIC ACID, VENOUS: 1.6 mmol/L (ref 0.5–1.9)
Lactic Acid, Venous: 1.8 mmol/L (ref 0.5–1.9)

## 2018-01-26 LAB — GLUCOSE, CAPILLARY: Glucose-Capillary: 128 mg/dL — ABNORMAL HIGH (ref 70–99)

## 2018-01-26 LAB — PROCALCITONIN

## 2018-01-26 MED ORDER — TRAZODONE HCL 50 MG PO TABS
50.0000 mg | ORAL_TABLET | Freq: Every evening | ORAL | Status: DC | PRN
  Administered 2018-01-30 – 2018-02-01 (×3): 50 mg via ORAL
  Filled 2018-01-26 (×3): qty 1

## 2018-01-26 MED ORDER — LIDOCAINE HCL 2 % IJ SOLN
INTRAMUSCULAR | Status: DC | PRN
  Administered 2018-01-26: 10 mL

## 2018-01-26 MED ORDER — LIDOCAINE HCL (PF) 2 % IJ SOLN
INTRAMUSCULAR | Status: AC
  Filled 2018-01-26: qty 20

## 2018-01-26 MED ORDER — FOLIC ACID 1 MG PO TABS
2.0000 mg | ORAL_TABLET | Freq: Every day | ORAL | Status: DC
  Administered 2018-01-26 – 2018-02-07 (×12): 2 mg via ORAL
  Filled 2018-01-26 (×13): qty 2

## 2018-01-26 NOTE — Progress Notes (Addendum)
PROGRESS NOTE  Barry Curry:[id number] DOB: [birth year] DOA: 01/21/2018 PCP: [name], PA-C  HPI/Recap of past 24 hours: Barry Curry is a 77 y.o. male with medical history significant of hypertension, hyperlipidemia, GERD, depression, anxiety, atrial fibrillation not on anticoagulant, CAD, PVC, aortic stenosis, RBBB, iron deficiency anemia, CKD, clear renal cell cancer (left nephrectomy), melanoma, OSA who presents with shortness of breath.  3 weeks prior had a right thoracentesis at [facility] with 670 cc of bloody fluid removed.  At this admission on 01/22/2018 patient had a ultrasound-guided right thoracentesis with 2 L of bloody fluid removed.  Pleural fluid sent for Gram stain, culture, cytology.  Pulmonology consulted to assess for recurrence of pleural effusion.  Exudative pleural fluid.  Gram stain negative.  Cytology still pending.  01/25/2018: Patient seen and examined at his bedside.  He has no new complaints.  He denies chest pain, dyspnea, or palpitations.  Afebrile with no leukocytosis.  Gram stain negative.  Awaiting results of cytology.  01/26/2018: Patient seen and examined at his bedside.  He reports dyspnea on minimal exertion.  He denies any chest pain or palpitations.  Chest x-ray done on 01/26/2018 independently reviewed revealed suspected right pleural effusion.  IR consulted for possible right thoracentesis.  Awaiting results of pleural fluid cytology.  UPDATE: 2.5 L bloody fluid removed from large R pleural effusion by IR 01/26/18. Sample sent again to cytology. Still waiting from previous cytology.   Assessment/Plan: Principal Problem:   Recurrent right pleural effusion Active Problems:   Essential hypertension   GERD   Acute respiratory failure with hypoxia (HCC)   PAF (paroxysmal atrial fibrillation) (HCC)   Clear cell carcinoma of kidney, left (HCC)   Anxiety   CAD (coronary artery disease)   Iron deficiency anemia   Elevated  troponin   CKD (chronic kidney disease), stage III (HCC)   Malnutrition of moderate degree  Acute hypoxic respiratory failure most likely secondary to recurrent right pleural effusion Post right thoracentesis 01/22/18 with 2 L of bloody fluid removed-Pleural fluid cytology pending.  Gram stain and body fluid culture negative. Had a right thoracentesis done 3 weeks prior at [facility] where 670 cc of bloody fluid yielded Exudative fluid.   Continue O2 supplementation to maintain O2 saturation greater than 92% Continue nebs Continue pulmonary toilet for nonproductive cough   Recurrent right pleural effusion post right thoracentesis on 01/22/2018 (2 L bloody fluid) and 01/26/18 (2.5 L bloody fluid) Repeat chest x-ray done on 01/26/2018 independently reviewed revealed right pleural effusion Possible third recurrence of the right pleural effusion post right thoracentesis x2 Body fluid Gram stain, and culture negative from fluid 01/22/18 Cytology pending   Leukocytosis Suspect reactive 2/2 recurrent pleural effusion Afebrile Negative procalcitonin Lactic acid 1.8 Repeat CBC in the morning  Iron deficiency anemia/acute blood loss anemia 2 L of bloody fluid removed from right thoracentesis done on 01/22/2018 and 2.5L bloody fluid removed on 01/26/18 Hemoglobin stable at 9.1 from 8.7 yesterday Transfuse if less than 8.0 and symptomatic Continue p.o. ferrous sulfate  Right pleural/chest wall nodules Rule out malignancy Awaiting cytology results If cytology negative, PET/CT scan to localize tissue to biopsy  New right internal mammary lymphadenopathy/small pulmonary nodules Suspicious for metastatic disease  2.3 cm right adrenal nodule Per CT angio chest with or without contrast done on 01/21/2018 If cytology is negative we will obtain PET/CT scan to direct biopsies.    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 chemotherapy due to side effect Awaiting results of pleural fluid cytology to determine if metastatic disease  CKD 3 in the setting of single kidney, worsening GFR 50 Creatinine is improving 1.29 from 1.35 yesterday Continue to avoid nephrotoxic agents Repeat BMP in the morning  Moderate protein calorie malnutrition BMI 27 Albumin 2.9 Encourage increase of oral protein calorie intake  Hypertension Blood pressure stable      Code Status: Full code  Family Communication: Daughter [name] at bedside [phone].  Updated on current condition and management on 01/23/2018.  Disposition Plan: Home possibly in 1 to 2 days when cytology results return.   Consultants:  Pulmonology  Procedures:  Right thoracentesis 01/22/2018  Antimicrobials:  None  DVT prophylaxis: Subcu Lovenox daily   Objective: Vitals:   01/26/18 0131 01/26/18 0349 01/26/18 0721 01/26/18 1130  BP: 130/90 (!) 148/79 123/87 107/66  Pulse: 94 (!) 102 100 96  Resp: (!) 34 (!) 22 (!) 36 (!) 35  Temp: 97.8 F (36.6 C) 97.6 F (36.4 C) 97.9 F (36.6 C) 97.9 F (36.6 C)  TempSrc: Oral Oral Oral Oral  SpO2: 95% 95% 93% 96%  Weight:      Height:        Intake/Output Summary (Last 24 hours) at 01/26/2018 1244 Last data filed at 01/26/2018 0530 Gross per 24 hour  Intake 745.98 ml  Output 975 ml  Net -229.02 ml   Filed Weights   01/21/18 1107  Weight: 92.1 kg (203 lb)    Exam:  . General: 77 y.o. year-old male well-developed well-nourished.  Appears uncomfortable due to dyspnea with minimal exertion.  Alert and oriented x3.   . Cardiovascular: Regular rate and rhythm with no rubs or gallops.  No thyromegaly or JVD noted. Marland Kitchen Respiratory: Diffuse rales bilaterally with good inspiratory effort. . Abdomen: Soft nontender nondistended with normal bowel sounds x4 quadrants. . Musculoskeletal: No lower extremity edema. 2/4 pulses in all 4  extremities. Marland Kitchen Psychiatry: Mood is appropriate for condition and setting   Data Reviewed: CBC: Recent Labs  Lab 01/21/18 1142 01/22/18 0401 01/23/18 0534 01/24/18 0752 01/25/18 0224 01/26/18 0216  WBC 10.2 10.0 10.0 12.2* 10.7* 22.9*  NEUTROABS 8.0*  --   --  9.4* 8.4* 20.9*  HGB 10.1* 9.1* 8.7* 9.5* 8.7* 9.1*  HCT 30.8* 29.3* 28.5* 31.1* 27.7* 29.0*  MCV 84.6 87.2 88.0 88.9 85.8 85.5  PLT 155 151 144* 182 178 916   Basic Metabolic Panel: Recent Labs  Lab 01/22/18 0401 01/23/18 0534 01/24/18 0752 01/25/18 0224 01/26/18 0216  NA 139 137 135 137 137  K 4.3 4.2 4.3 4.1 4.4  CL 108 107 103 107 107  CO2 21* 22 19* 23 23  GLUCOSE 101* 85 86 81 149*  BUN 20 20 18 19 21   CREATININE 1.32* 1.42* 1.29* 1.35* 1.29*  CALCIUM 8.5* 8.3* 8.8* 8.4* 8.7*   GFR: Estimated Creatinine Clearance: 52.6 mL/min (A) (by C-G formula based on SCr of 1.29 mg/dL (H)). Liver Function Tests: Recent Labs  Lab 01/21/18 1142 01/24/18 0752 01/25/18 0224 01/26/18 0216  AST 22 22 17 19   ALT 13 14 12 15   ALKPHOS 82 73 66 67  BILITOT 0.4 0.8 0.6 0.7  PROT 6.6 5.5* 5.3* 5.5*  ALBUMIN 3.3* 2.9* 2.6* 2.7*   No results for input(s): LIPASE, AMYLASE in the last 168 hours. No results for input(s): AMMONIA in the last 168 hours.  Coagulation Profile: Recent Labs  Lab 01/21/18 1142  INR 1.08   Cardiac Enzymes: Recent Labs  Lab 01/21/18 1142 01/21/18 2225 01/22/18 0401 01/22/18 1001  TROPONINI 0.03* <0.03 0.03* <0.03   BNP (last 3 results) No results for input(s): PROBNP in the last 8760 hours. HbA1C: No results for input(s): HGBA1C in the last 72 hours. CBG: No results for input(s): GLUCAP in the last 168 hours. Lipid Profile: No results for input(s): CHOL, HDL, LDLCALC, TRIG, CHOLHDL, LDLDIRECT in the last 72 hours. Thyroid Function Tests: No results for input(s): TSH, T4TOTAL, FREET4, T3FREE, THYROIDAB in the last 72 hours. Anemia Panel: Recent Labs    01/24/18 0752  FERRITIN  116  TIBC 256  IRON 40*   Urine analysis:    Component Value Date/Time   COLORURINE YELLOW 01/21/2018 1432   APPEARANCEUR CLEAR 01/21/2018 1432   LABSPEC <1.005 (L) 01/21/2018 1432   PHURINE 5.5 01/21/2018 1432   GLUCOSEU NEGATIVE 01/21/2018 1432   HGBUR NEGATIVE 01/21/2018 1432   BILIRUBINUR NEGATIVE 01/21/2018 1432   KETONESUR NEGATIVE 01/21/2018 1432   PROTEINUR NEGATIVE 01/21/2018 1432   UROBILINOGEN 1.0 03/21/2010 1348   NITRITE NEGATIVE 01/21/2018 1432   LEUKOCYTESUR NEGATIVE 01/21/2018 1432   Sepsis Labs: @LABRCNTIP (procalcitonin:4,lacticidven:4)  ) Recent Results (from the past 240 hour(s))  Culture, blood (Routine x 2)     Status: None (Preliminary result)   Collection Time: 01/21/18 11:40 AM  Result Value Ref Range Status   Specimen Description   Final    BLOOD RIGHT ARM Performed at [facility], [address] [place], [place] [address]    Special Requests   Final    BOTTLES DRAWN AEROBIC AND ANAEROBIC Blood Culture adequate volume Performed at [facility], [address] [place], [place] [address]    Culture   Final    NO GROWTH 4 DAYS Performed at [facility], [place] [address], [place], [place] [address]    Report Status PENDING  Incomplete  Culture, blood (Routine x 2)     Status: None (Preliminary result)   Collection Time: 01/21/18 11:50 AM  Result Value Ref Range Status   Specimen Description   Final    BLOOD LEFT ARM Performed at [facility], [address] [place], [place] [address]    Special Requests   Final    BOTTLES DRAWN AEROBIC AND ANAEROBIC Blood Culture results may not be optimal due to an inadequate volume of blood received in culture bottles Performed at [facility], [address] [place], [place] [address]    Culture   Final    NO GROWTH 4 DAYS Performed at [facility], [place] [address] [place], [place] [address]    Report Status PENDING  Incomplete  Urine culture      Status: Abnormal   Collection Time: 01/21/18  2:32 PM  Result Value Ref Range Status   Specimen Description   Final    URINE, CLEAN CATCH Performed at [facility], [address] [place], [place] [address]    Special Requests   Final    NONE Performed at [facility], [address] [place], [place] [address]    Culture 20,000 COLONIES/mL PSEUDOMONAS AERUGINOSA (A)  Final   Report Status 01/24/2018 FINAL  Final   Organism ID, Bacteria PSEUDOMONAS AERUGINOSA (A)  Final      Susceptibility   Pseudomonas aeruginosa - MIC*  CEFTAZIDIME 2 SENSITIVE Sensitive     CIPROFLOXACIN 1 SENSITIVE Sensitive     GENTAMICIN 4 SENSITIVE Sensitive     IMIPENEM 2 SENSITIVE Sensitive     PIP/TAZO <=4 SENSITIVE Sensitive     CEFEPIME 8 SENSITIVE Sensitive     * 20,000 COLONIES/mL PSEUDOMONAS AERUGINOSA  MRSA PCR Screening     Status: None   Collection Time: 01/21/18  8:47 PM  Result Value Ref Range Status   MRSA by PCR NEGATIVE NEGATIVE Final    Comment:        The GeneXpert MRSA Assay (FDA approved for NASAL specimens only), is one component of a comprehensive MRSA colonization surveillance program. It is not intended to diagnose MRSA infection nor to guide or monitor treatment for MRSA infections. Performed at [facility], [place] [address] [place], [place] [address]   Culture, body fluid-bottle     Status: None (Preliminary result)   Collection Time: 01/22/18  2:59 PM  Result Value Ref Range Status   Specimen Description PLEURAL RIGHT  Final   Special Requests NONE  Final   Culture   Final    NO GROWTH 3 DAYS Performed at [place] [address], [place], [place] [address]    Report Status PENDING  Incomplete  Gram stain     Status: None   Collection Time: 01/22/18  2:59 PM  Result Value Ref Range Status   Specimen Description PLEURAL RIGHT  Final   Special Requests NONE  Final   Gram Stain   Final    FEW WBC PRESENT, PREDOMINANTLY  MONONUCLEAR NO ORGANISMS SEEN Performed at [facility], [address], [place], [place] [address]    Report Status 01/22/2018 FINAL  Final      Studies: Dg Chest Port 1 View  Result Date: 01/26/2018 CLINICAL DATA:  Shortness of Breath EXAM: PORTABLE CHEST 1 VIEW COMPARISON:  01/22/2018 FINDINGS: Left pacer remains in place, unchanged. Right base atelectasis with small right effusion. No confluent opacity on the left. Heart is normal size. IMPRESSION: Small right effusion with right base atelectasis. Electronically Signed   By: Rolm Baptise M.D.   On: 01/26/2018 07:36    Scheduled Meds: . lidocaine      . dronedarone  400 mg Oral BID WC  . feeding supplement (ENSURE ENLIVE)  237 mL Oral BID BM  . ferrous sulfate  325 mg Oral Q breakfast  . fluticasone  2 spray Each Nare QHS  . folic acid  2 mg Oral Daily  . lipase/protease/amylase  72,000 Units Oral BID AC  . mouth rinse  15 mL Mouth Rinse BID  . metoprolol succinate  25 mg Oral Daily  . pantoprazole  40 mg Oral Daily  . tamsulosin  0.4 mg Oral QPC supper  . vitamin B-6  25 mg Oral BID    Continuous Infusions:    LOS: 4 days     Kayleen Memos, MD [facility] Pager [phone]  If 7PM-7AM, please contact night-coverage [email or web address] Password [facility] 01/26/2018, 12:44 PM

## 2018-01-26 NOTE — Plan of Care (Signed)
Discussed plan of care for patient during evening shift.  Patient is anxious to return home.  This nurse stressed the importance of using the call button when assistance is needed.  Good teach back displayed.

## 2018-01-26 NOTE — Plan of Care (Signed)
Discussed plan of care for the evening with patient.  Emphasized the importance of using the call button when assistance is needed.  Good teach back displayed. 

## 2018-01-26 NOTE — Progress Notes (Signed)
For some reason, the pathology still not back on the fluid that was taken out on Friday.  I am not sure why the pathology is not back yet.  I truly hope that cytology was sent off on the fluid.  I will had to call pathology today to see with going on.  He got iron.  This was without difficulty.  His hemoglobin is 9.1.  His erythropoietin level is only 30.  As such, I think he will respond to Aranesp.  I will have to talk to him about this.  Again, my concern is the cytology on the pleural fluid.  I am not sure why is not back.  His electrolytes look okay.  His creatinine is 1.29.  Lattie Haw, MD  Psalm 91:1-2

## 2018-01-26 NOTE — Procedures (Signed)
PROCEDURE SUMMARY:  Successful image-guided right thoracentesis. Yielded 2.5 liters of bloody fluid. Patient tolerated procedure well. No immediate complications.  Specimen was sent for labs. CXR ordered.  Joaquim Nam PA-C 01/26/2018 1:08 PM

## 2018-01-26 NOTE — Progress Notes (Signed)
CSW met with pt dtr Ailene Ravel and provided with current list of SNF options.  Explained short term SNF recommendations and steps to choosing a facility  CSW will continue to follow and assist as needed  Jorge Ny, [place] Social Worker [phone]

## 2018-01-27 ENCOUNTER — Telehealth: Payer: Self-pay | Admitting: Medical Oncology

## 2018-01-27 DIAGNOSIS — J90 Pleural effusion, not elsewhere classified: Secondary | ICD-10-CM

## 2018-01-27 DIAGNOSIS — D509 Iron deficiency anemia, unspecified: Secondary | ICD-10-CM

## 2018-01-27 DIAGNOSIS — I10 Essential (primary) hypertension: Secondary | ICD-10-CM

## 2018-01-27 DIAGNOSIS — D508 Other iron deficiency anemias: Secondary | ICD-10-CM

## 2018-01-27 LAB — CBC WITH DIFFERENTIAL/PLATELET
Abs Immature Granulocytes: 0.2 10*3/uL — ABNORMAL HIGH (ref 0.0–0.1)
Basophils Absolute: 0 10*3/uL (ref 0.0–0.1)
Basophils Relative: 0 %
Eosinophils Absolute: 0.1 10*3/uL (ref 0.0–0.7)
Eosinophils Relative: 0 %
HCT: 27.9 % — ABNORMAL LOW (ref 39.0–52.0)
HEMOGLOBIN: 8.9 g/dL — AB (ref 13.0–17.0)
Immature Granulocytes: 1 %
Lymphocytes Relative: 5 %
Lymphs Abs: 0.8 10*3/uL (ref 0.7–4.0)
MCH: 27 pg (ref 26.0–34.0)
MCHC: 31.9 g/dL (ref 30.0–36.0)
MCV: 84.5 fL (ref 78.0–100.0)
Monocytes Absolute: 1.6 10*3/uL — ABNORMAL HIGH (ref 0.1–1.0)
Monocytes Relative: 11 %
NEUTROS PCT: 83 %
Neutro Abs: 11.9 10*3/uL — ABNORMAL HIGH (ref 1.7–7.7)
Platelets: 217 10*3/uL (ref 150–400)
RBC: 3.3 MIL/uL — ABNORMAL LOW (ref 4.22–5.81)
RDW: 18 % — AB (ref 11.5–15.5)
WBC: 14.6 10*3/uL — ABNORMAL HIGH (ref 4.0–10.5)

## 2018-01-27 LAB — COMPREHENSIVE METABOLIC PANEL
ALT: 15 U/L (ref 0–44)
AST: 21 U/L (ref 15–41)
Albumin: 2.4 g/dL — ABNORMAL LOW (ref 3.5–5.0)
Alkaline Phosphatase: 65 U/L (ref 38–126)
Anion gap: 5 (ref 5–15)
BUN: 22 mg/dL (ref 8–23)
CO2: 25 mmol/L (ref 22–32)
Calcium: 8.5 mg/dL — ABNORMAL LOW (ref 8.9–10.3)
Chloride: 107 mmol/L (ref 98–111)
Creatinine, Ser: 1.29 mg/dL — ABNORMAL HIGH (ref 0.61–1.24)
GFR calc Af Amer: >60 mL/min (ref >60)
GFR calc non Af Amer: 52 mL/min — ABNORMAL LOW (ref >60)
Glucose, Bld: 92 mg/dL (ref 70–99)
Potassium: 4.2 mmol/L (ref 3.5–5.1)
Sodium: 137 mmol/L (ref 135–145)
Total Bilirubin: 0.5 mg/dL (ref 0.3–1.2)
Total Protein: 4.9 g/dL — ABNORMAL LOW (ref 6.5–8.1)

## 2018-01-27 LAB — CULTURE, BODY FLUID-BOTTLE: CULTURE: NO GROWTH

## 2018-01-27 LAB — CULTURE, BODY FLUID W GRAM STAIN -BOTTLE

## 2018-01-27 LAB — MAGNESIUM: Magnesium: 1.7 mg/dL (ref 1.7–2.4)

## 2018-01-27 MED ORDER — DARBEPOETIN ALFA 150 MCG/0.3ML IJ SOSY
150.0000 ug | PREFILLED_SYRINGE | Freq: Once | INTRAMUSCULAR | Status: AC
  Administered 2018-01-27: 150 ug via SUBCUTANEOUS
  Filled 2018-01-27: qty 0.3

## 2018-01-27 NOTE — Progress Notes (Signed)
Nutrition Consult/Follow Up  DOCUMENTATION CODES:   Non-severe (moderate) malnutrition in context of chronic illness  INTERVENTION:    Ensure Enlive po BID, each supplement provides 350 kcal and 20 grams of protein  If within [place], consider nutrition support via Cortrak 10 F feeding tube  NUTRITION DIAGNOSIS:   Moderate Malnutrition related to chronic illness(CKD, CAD, likely cancer recurrence) as evidenced by mild muscle/fat loss, ongoing  GOAL:   Patient will meet greater than or equal to 90% of their needs, unmet  MONITOR:   PO intake, Supplement acceptance, Labs, Weight trends  ASSESSMENT:   77 y/o male PMHx HTN/HLD, GERD, Depression, Anxiety, Afib, CAD, PVC, AS, RBB, IDA, CKD, RCC S/p L nephrectomy. Presents with SOB. Work up revealed R pleural effusion and new R mammary lymphadenopathy, pulm nodules, R adrenal nodules suspicious for metastatic diease. Admitted for thoracentsis and management of resp failure.   7/27 & 7/31 thoracentesis per IR  Initial nutrition assessment completed 7/27. Spoke with RN. Pt is very depressed. Not eating meals. He is however drinking his Ensure Enlive nutrition supplements.  Pt was identified with moderate malnutrition which is ongoing. Oncology notes reviewed. Awaiting cytology from first thoracentesis.  Labs and medications reviewed. Mg, K WNL. CBG 128.  Diet Order:   Diet Order           Diet Heart Room service appropriate? Yes; Fluid consistency: Thin  Diet effective now         EDUCATION NEEDS:   Education needs have been addressed  Skin:  Skin Assessment: Reviewed RN Assessment  Last BM:  7/30  Height:   Ht Readings from Last 1 Encounters:  01/21/18 6' (1.829 m)   Weight:   Wt Readings from Last 1 Encounters:  01/21/18 203 lb (92.1 kg)   Wt Readings from Last 10 Encounters:  01/21/18 203 lb (92.1 kg)  01/12/18 203 lb 8 oz (92.3 kg)  12/15/17 205 lb (93 kg)  11/17/17 206 lb 6.4 oz (93.6 kg)  09/22/17 208 lb  8 oz (94.6 kg)  08/25/17 207 lb 8 oz (94.1 kg)  07/23/17 221 lb 1.9 oz (100.3 kg)  07/19/17 221 lb 4 oz (100.4 kg)  07/15/12 237 lb (107.5 kg)   Ideal Body Weight:  80.91 kg  BMI:  Body mass index is 27.53 kg/m.  Estimated Nutritional Needs:   Kcal:  2000-2200   Protein:  100-115 gm   Fluid:  2.0-2.2 L/day  Arthur Holms, RD, LDN Pager #: [phone] After-Hours Pager #: [phone]

## 2018-01-27 NOTE — Progress Notes (Signed)
PT Cancellation Note  Patient Details Name: DEJAN ANGERT MRN: [id number] DOB: [birth year]   Cancelled Treatment:    Reason Eval/Treat Not Completed: (P) Fatigue/lethargy limiting ability to participate Pt declined PT due to increased effort with movement. PT to follow back tomorrow for treatment.  Zakariah Dejarnette B. Migdalia Dk PT, DPT Acute Rehabilitation  [phone] Pager [phone]  [place] 01/27/2018, 5:35 PM

## 2018-01-27 NOTE — Progress Notes (Signed)
PROGRESS NOTE    Barry Curry  DXA:[id number] DOB: 05-12-1941 DOA: 01/21/2018 PCP: [name], PA-C    Brief Narrative:  Barry Curry a 77 y.o.malewith medical history significant ofhypertension, hyperlipidemia, GERD, depression, anxiety, atrial fibrillation not on anticoagulant, CAD, PVC, aortic stenosis, RBBB, iron deficiency anemia, CKD, clear renal cell cancer (left nephrectomy), melanoma, OSA who presents with shortness of breath.  3 weeks prior had a right thoracentesis at [facility] with 670 cc of bloody fluid removed.  At this admission on 01/22/2018 patient had a ultrasound-guided right thoracentesis with 2 L of bloody fluid removed.  Pleural fluid sent for Gram stain, culture, cytology.  Pulmonology consulted to assess for recurrence of pleural effusion.  Exudative pleural fluid.  Gram stain negative.  Cytology still pending. He underwent repeat thoracentesis on 7/31 for recurrent pleural effusion,  with cytology pending.    Assessment & Plan:   Principal Problem:   Recurrent right pleural effusion Active Problems:   Essential hypertension   GERD   Acute respiratory failure with hypoxia (HCC)   PAF (paroxysmal atrial fibrillation) (HCC)   Clear cell carcinoma of kidney, left (HCC)   Anxiety   CAD (coronary artery disease)   Iron deficiency anemia   Elevated troponin   CKD (chronic kidney disease), stage III (HCC)   Malnutrition of moderate degree   Acute respiratory failure with hypoxia secondary to recurrent pleural effusion, possibly malignant effusion  - IR guided thoracentesis three times in the last  30 days.  - exudative, gram stain negative, culture negative and cytology pending for the last 2 pleural fluid analysis.  - [place] oxygen to keep sats greater than 90%.   Paroxysmal atrial fibrillation:  Rate controlled. On metoprolol and dronedarone.   Iron deficiency anemia/ acute blood loss anemia:  Hemoglobin stabilized around 8.  IV  iron and aranesp ordered by oncology.  Transfuse to keep hemoglobin less than 8.   Hypertension:  Well controlled.   CAD: no chest pain or sob. Resume BB.   GERD stable.   Stage 3 CKD.  Creatinine stable at 1.29.   Leukocytosis: suspect reactive.  Afebrile, no dysuria symptoms. Urine culture report grew 20,000 pseudomonas.  Blood cultures have been negative so far.  Pleural fluid culture negative.    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  Dr Marin Olp on board.  Follow up with PET/CT scan for biopsy of the adrenal nodules and pulmonary nodules.    Moderate Protein Calorie malnutrition:  - nutrition consulted.        DVT prophylaxis: SCD'S Code Status: full code.  Family Communication: none at bedside Disposition Plan: pending cardiothoracic evaluation.    Consultants:   Oncology  Tcts.    Procedures: IR thoracentesis on 7/31  Antimicrobials: none.   Subjective: Breathing has improved.   Objective: Vitals:   01/26/18 1539 01/26/18 1949 01/27/18 0049 01/27/18 0412  BP: 122/62 112/67 (!) 130/21 105/67  Pulse: 93 75    Resp: (!) 30 19 (!) 30 (!) 35  Temp: (!) 97.5 F (36.4 C) 98.4 F (36.9 C) 99.3 F (37.4 C) 99.3 F (37.4 C)  TempSrc: Oral Oral Axillary Axillary  SpO2: 97% 100%    Weight:      Height:        Intake/Output Summary (Last 24 hours) at 01/27/2018 0750 Last data filed at 01/26/2018 1440 Gross per 24 hour  Intake 720 ml  Output -  Net 720 ml  Filed Weights   01/21/18 1107  Weight: 92.1 kg (203 lb)    Examination:  General exam: Appears calm and comfortable on 2 lit of [place] oxygen.  Respiratory system: diminished on the right side.  Cardiovascular system: S1 & S2 heard,irregular,  Gastrointestinal system: Abdomen is nondistended, soft and nontender. No organomegaly or masses felt. Normal bowel sounds heard. Central nervous system: Alert and oriented Extremities: Symmetric 5 x 5  power. Skin: No rashes, lesions or ulcers Psychiatry: . Mood & affect appropriate.     Data Reviewed: I have personally reviewed following labs and imaging studies  CBC: Recent Labs  Lab 01/21/18 1142  01/23/18 0534 01/24/18 0752 01/25/18 0224 01/26/18 0216 01/26/18 1846 01/27/18 0224  WBC 10.2   < > 10.0 12.2* 10.7* 22.9*  --  14.6*  NEUTROABS 8.0*  --   --  9.4* 8.4* 20.9*  --  11.9*  HGB 10.1*   < > 8.7* 9.5* 8.7* 9.1* 8.9* 8.9*  HCT 30.8*   < > 28.5* 31.1* 27.7* 29.0* 28.1* 27.9*  MCV 84.6   < > 88.0 88.9 85.8 85.5  --  84.5  PLT 155   < > 144* 182 178 216  --  217   < > = values in this interval not displayed.   Basic Metabolic Panel: Recent Labs  Lab 01/23/18 0534 01/24/18 0752 01/25/18 0224 01/26/18 0216 01/27/18 0224  NA 137 135 137 137 137  K 4.2 4.3 4.1 4.4 4.2  CL 107 103 107 107 107  CO2 22 19* 23 23 25   GLUCOSE 85 86 81 149* 92  BUN 20 18 19 21 22   CREATININE 1.42* 1.29* 1.35* 1.29* 1.29*  CALCIUM 8.3* 8.8* 8.4* 8.7* 8.5*   GFR: Estimated Creatinine Clearance: 52.6 mL/min (A) (by C-G formula based on SCr of 1.29 mg/dL (H)). Liver Function Tests: Recent Labs  Lab 01/21/18 1142 01/24/18 0752 01/25/18 0224 01/26/18 0216 01/27/18 0224  AST 22 22 17 19 21   ALT 13 14 12 15 15   ALKPHOS 82 73 66 67 65  BILITOT 0.4 0.8 0.6 0.7 0.5  PROT 6.6 5.5* 5.3* 5.5* 4.9*  ALBUMIN 3.3* 2.9* 2.6* 2.7* 2.4*   No results for input(s): LIPASE, AMYLASE in the last 168 hours. No results for input(s): AMMONIA in the last 168 hours. Coagulation Profile: Recent Labs  Lab 01/21/18 1142  INR 1.08   Cardiac Enzymes: Recent Labs  Lab 01/21/18 1142 01/21/18 2225 01/22/18 0401 01/22/18 1001  TROPONINI 0.03* <0.03 0.03* <0.03   BNP (last 3 results) No results for input(s): PROBNP in the last 8760 hours. HbA1C: No results for input(s): HGBA1C in the last 72 hours. CBG: Recent Labs  Lab 01/26/18 1937  GLUCAP 128*   Lipid Profile: No results for input(s):  CHOL, HDL, LDLCALC, TRIG, CHOLHDL, LDLDIRECT in the last 72 hours. Thyroid Function Tests: No results for input(s): TSH, T4TOTAL, FREET4, T3FREE, THYROIDAB in the last 72 hours. Anemia Panel: Recent Labs    01/24/18 0752  FERRITIN 116  TIBC 256  IRON 40*   Sepsis Labs: Recent Labs  Lab 01/21/18 1147 01/26/18 0650 01/26/18 0935  PROCALCITON  --  <0.10  --   LATICACIDVEN 1.50 1.6 1.8    Recent Results (from the past 240 hour(s))  Culture, blood (Routine x 2)     Status: None   Collection Time: 01/21/18 11:40 AM  Result Value Ref Range Status   Specimen Description   Final    BLOOD RIGHT ARM Performed at  [facility], [address] [place], [place] [address]    Special Requests   Final    BOTTLES DRAWN AEROBIC AND ANAEROBIC Blood Culture adequate volume Performed at [facility], [address] [place], [place] [address]    Culture   Final    NO GROWTH 5 DAYS Performed at [facility], [place] [address], [place], [place] [address]    Report Status 01/26/2018 FINAL  Final  Culture, blood (Routine x 2)     Status: None   Collection Time: 01/21/18 11:50 AM  Result Value Ref Range Status   Specimen Description   Final    BLOOD LEFT ARM Performed at [facility], [address] [place], [place] [address]    Special Requests   Final    BOTTLES DRAWN AEROBIC AND ANAEROBIC Blood Culture results may not be optimal due to an inadequate volume of blood received in culture bottles Performed at [facility], [address] [place], [place] [address]    Culture   Final    NO GROWTH 5 DAYS Performed at [facility], [place] [address] [place], [place] [address]    Report Status 01/26/2018 FINAL  Final  Urine culture     Status: Abnormal   Collection Time: 01/21/18  2:32 PM  Result Value Ref Range Status   Specimen Description   Final    URINE, CLEAN CATCH Performed at [facility], [address] [place], [place] [address]    Special Requests   Final    NONE Performed at [facility], [address] [place], [place] [address]    Culture 20,000 COLONIES/mL PSEUDOMONAS AERUGINOSA (A)  Final   Report Status 01/24/2018 FINAL  Final   Organism ID, Bacteria PSEUDOMONAS AERUGINOSA (A)  Final      Susceptibility   Pseudomonas aeruginosa - MIC*    CEFTAZIDIME 2 SENSITIVE Sensitive     CIPROFLOXACIN 1 SENSITIVE Sensitive     GENTAMICIN 4 SENSITIVE Sensitive     IMIPENEM 2 SENSITIVE Sensitive     PIP/TAZO <=4 SENSITIVE Sensitive     CEFEPIME 8 SENSITIVE Sensitive     * 20,000 COLONIES/mL PSEUDOMONAS AERUGINOSA  MRSA PCR Screening     Status: None   Collection Time: 01/21/18  8:47 PM  Result Value Ref Range Status   MRSA by PCR NEGATIVE NEGATIVE Final    Comment:        The GeneXpert MRSA Assay (FDA approved for NASAL specimens only), is one component of a comprehensive MRSA colonization surveillance program. It is not intended to diagnose MRSA infection nor to guide or monitor treatment for MRSA infections. Performed at [facility], [place] [address] [place], [place] [address]   Culture, body fluid-bottle     Status: None (Preliminary result)   Collection Time: 01/22/18  2:59 PM  Result Value Ref Range Status   Specimen Description PLEURAL RIGHT  Final   Special Requests NONE  Final   Culture   Final    NO GROWTH 4 DAYS Performed at [facility] [address], [place], [place] [address]    Report Status PENDING  Incomplete  Gram stain     Status: None   Collection Time: 01/22/18  2:59 PM  Result Value Ref Range Status   Specimen Description PLEURAL RIGHT  Final   Special Requests NONE  Final  Gram Stain   Final    FEW WBC PRESENT, PREDOMINANTLY MONONUCLEAR NO ORGANISMS SEEN Performed at [facility] [address], [place], [place] [address]    Report Status 01/22/2018 FINAL  Final         Radiology Studies: Dg Chest 1 View  Result  Date: 01/26/2018 CLINICAL DATA:  Status post right thoracentesis. EXAM: CHEST  1 VIEW COMPARISON:  01/26/2018 FINDINGS: Loculated right pleural effusion. No evidence of pneumothorax post thoracentesis. Chronic elevation of left hemidiaphragm. Stably enlarged cardiac silhouette. Mediastinal contours appear intact. 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 Low lung volumes. Lateral humeral prosthesis, partially visualized. Soft tissues are grossly normal. IMPRESSION: Loculated right pleural effusion. No evidence of pneumothorax post thoracentesis. Stably enlarged cardiac silhouette. Electronically Signed   By: Fidela Salisbury M.D.   On: 01/26/2018 13:13   Dg Chest Port 1 View  Result Date: 01/26/2018 CLINICAL DATA:  Shortness of Breath EXAM: PORTABLE CHEST 1 VIEW COMPARISON:  01/22/2018 FINDINGS: Left pacer remains in place, unchanged. Right base atelectasis with small right effusion. No confluent opacity on the left. Heart is normal size. IMPRESSION: Small right effusion with right base atelectasis. Electronically Signed   By: Rolm Baptise M.D.   On: 01/26/2018 07:36   Ir Thoracentesis Asp Pleural Space W/img Guide  Result Date: 01/26/2018 INDICATION: Recurrent right sided pleural effusion with increase dyspnea. EXAM: ULTRASOUND GUIDED RIGHT THORACENTESIS MEDICATIONS: 10 mL 2% lidocaine. COMPLICATIONS: None immediate. PROCEDURE: An ultrasound guided thoracentesis was thoroughly discussed with the patient and questions answered. The benefits, risks, alternatives and complications were also discussed. The patient understands and wishes to proceed with the procedure. Written consent was obtained. Ultrasound was performed to localize and mark an adequate pocket of fluid in the right chest. The area was then prepped and draped in the normal sterile fashion. 2% Lidocaine was used for local anesthesia. Under ultrasound guidance a 6 Fr Safe-T-Centesis catheter was introduced. Thoracentesis was performed.  The catheter was removed and a dressing applied. FINDINGS: A total of approximately 2.5L of bloody fluid was removed. Samples were sent to the laboratory as requested by the clinical team. IMPRESSION: Successful ultrasound guided right thoracentesis yielding of pleural fluid. Read by Candiss Norse, PA-C Electronically Signed   By: Aletta Edouard M.D.   On: 01/26/2018 13:25        Scheduled Meds: . darbepoetin (ARANESP) injection - NON-DIALYSIS  150 mcg Subcutaneous Once  . dronedarone  400 mg Oral BID WC  . feeding supplement (ENSURE ENLIVE)  237 mL Oral BID BM  . ferrous sulfate  325 mg Oral Q breakfast  . fluticasone  2 spray Each Nare QHS  . folic acid  2 mg Oral Daily  . lipase/protease/amylase  72,000 Units Oral BID AC  . mouth rinse  15 mL Mouth Rinse BID  . metoprolol succinate  25 mg Oral Daily  . pantoprazole  40 mg Oral Daily  . tamsulosin  0.4 mg Oral QPC supper  . vitamin B-6  25 mg Oral BID   Continuous Infusions:   LOS: 5 days    Time spent: 35 minutes.     Hosie Poisson, MD [facility] Pager [phone]  If 7PM-7AM, please contact night-coverage [email or web address] Password [email or web address] 01/27/2018, 7:50 AM

## 2018-01-27 NOTE — Telephone Encounter (Signed)
Pt update requested- dtr missed Dr Marin Olp today on rounds . She talked to her father today who is very upset. Please call Meredeth with update. [phone]

## 2018-01-27 NOTE — Progress Notes (Signed)
Surprisingly enough, [name] had another thoracentesis.  Over 2 L of fluid was taken out.  The cytology on his first thoracentesis over the weekend still is not back yet.  I definitely for him that this is going to be malignant.  Not many conditions because of recurrent pleural effusions as quickly as cancer can.  If his fluid comes back malignant, I would clearly get thoracic surgery involved so they can do a VATS pleurodesis.  They can also get pleural biopsies for [place].  He is anemic.  His iron was low.  He did get IV iron.  He does have a low erythropoietin level.  I talked to him about giving Aranesp today.  I explained to him what Aranesp was.  He agrees to try this.  His hemoglobin is 8.9.  His platelet count is 217K, his white cell count 14.6.  He still has the arrhythmias on his cardiac monitor.  His potassium is 4.2.  His sodium is 137.  Creatinine 1.29.  Calcium 8.5.  His vital signs show temperature 99.3.  Pulse 75.  Blood pressure 105/67.  Oxygen saturation is 100%.  His lungs do show some decreased breath sounds on the right side.  He does have some scattered rales and crackles.  Cardiac exam is irregular rate and rhythm.  He does have a pacer in.  Abdomen is soft.  There is no fluid wave.  There is no palpable abdominal mass.  There is no palpable liver or spleen tip.  Extremities shows some trace edema in his legs.  I am sorry to worried that the fluid that was removed is malignant.  Hopefully, cytology will come out from the fluid that was taken off over the weekend.  I would get thoracic surgery involved so that they can plan for a VATS pleurodesis.  I think this would be the best way to try to prevent fluid accumulation from occurring.  We will give a dose of Aranesp today.  I have thought about doing a blood transfusion but hopefully we can hold on that.  Barry Haw, MD  Psalm 86:5

## 2018-01-27 NOTE — Consult Note (Signed)
Reason for Consult:Right pleural effusion Referring Physician: Cl[place]on, Oncology- DR. Ennever  Barry Curry is an 77 y.o. male.  HPI: 77 yo man admitted with cc/o SOB.  Barry Curry is a 77 yo man with a complicated medical history including nephrectomy for clear cell carcinoma, melanoma, atrial fibrillation, aortic stenosis, mitral stenosis, cardiomyopathy, hypertension, hyperlipidemia, arthritis, and depression. Developed shortness of breath in late June. CT chest 6/30 showed a moderate right pleural effusion, no chest wall nodule or mass. July 1 thoracentesis drained ~670 ml, some symptomatic improvement. Apparently not sent for cytology(at least not within our system). Developed recurrent dyspnea. Treated with empiric doxycycline without benefit. Ct on 7.26 showed a recurrent effusion, right IM node and pleural nodules. Had 2 liters drained on 7/27, incompletely drained. Another 2 L drained 7/31. Cytology on both specimens still pending.   Lives with his daughter.  Past Medical History:  Diagnosis Date  . Anemia    history of  . Anxiety   . Aortic stenosis   . Arthritis   . Cancer ([place])    Melanoma back of neck  . Cancer of kidney ([place])   . Cardiomyopathy ([place])   . Clear cell carcinoma of kidney, left ([place]) 11/17/2017  . Complication of anesthesia    violent wake up from anesthesia  . Coronary artery disease   . Depression   . Dyspnea   . Esophageal stricture   . Goals of care, counseling/discussion 11/17/2017  . Heart murmur   . History of atrial fibrillation   . History of claustrophobia   . Hyperlipemia   . Hypertension   . Left kidney mass   . Lower extremity edema   . Mitral stenosis   . PVC (premature ventricular contraction)   . RBBB (right bundle branch block with left anterior fascicular block)     Past Surgical History:  Procedure Laterality Date  . CARDIOVERSION    . COLONOSCOPY    . ESOPHAGEAL DILATION    . IR THORACENTESIS ASP PLEURAL SPACE W/IMG GUIDE   01/26/2018  . KNEE ARTHROSCOPY Left   . KNEE ARTHROSCOPY Right   . ROBOT ASSISTED LAPAROSCOPIC NEPHRECTOMY Left 07/23/2017   Procedure: XI ROBOTIC ASSISTED LAPAROSCOPIC NEPHRECTOMY;  Surgeon: Alexis Frock, MD;  Location: [facility];  Service: Urology;  Laterality: Left;  . SHOULDER ARTHROSCOPY N/A   . TOTAL HIP ARTHROPLASTY Left   . TOTAL HIP ARTHROPLASTY Right   . TOTAL KNEE ARTHROPLASTY Left   . TOTAL KNEE ARTHROPLASTY Right   . TOTAL SHOULDER REPLACEMENT Left   . TOTAL SHOULDER REPLACEMENT Right   . UPPER GI ENDOSCOPY      Family History  Problem Relation Age of Onset  . Breast cancer Mother   . Diabetes Mellitus II Father   . Diabetes Mellitus II Sister     Social History:  reports that he quit smoking about 46 years ago. His smoking use included cigarettes. He has a 0.40 pack-year smoking history. He has never used smokeless tobacco. He reports that he drinks alcohol. He reports that he does not use drugs.  Allergies:  Allergies  Allergen Reactions  . Penicillins Rash    Has patient had a PCN reaction causing immediate rash, facial/tongue/throat swelling, SOB or lightheadedness with hypotension: No Has patient had a PCN reaction causing severe rash involving mucus membranes or skin necrosis: No Has patient had a PCN reaction that required hospitalization: No Has patient had a PCN reaction occurring within the last 10 years: No If all of the  above answers are "NO", then may proceed with Cephalosporin use.     Medications:  Prior to Admission:  Medications Prior to Admission  Medication Sig Dispense Refill Last Dose  . acetaminophen (TYLENOL) 500 MG tablet Take 500 mg by mouth daily.    01/21/2018 at Unknown time  . diclofenac sodium (VOLTAREN) 1 % GEL Apply 4 g topically as needed (joint pain).    unk at prn  . doxycycline (VIBRAMYCIN) 100 MG capsule Take 100 mg by mouth 2 (two) times daily. For 10 days   01/21/2018 at Unknown time  . Dronedarone HCl (MULTAQ PO) Take by  mouth.   01/21/2018 at Unknown time  . Ferrous Sulfate Dried (FERROUS SULFATE CR PO) Take by mouth daily.   01/21/2018 at Unknown time  . fluticasone (FLONASE) 50 MCG/ACT nasal spray Place 2 sprays into both nostrils at bedtime.   01/21/2018 at Unknown time  . lipase/protease/amylase (CREON) 36000 UNITS CPEP capsule Take 2 capsules (72,000 Units total) by mouth 2 (two) times daily before lunch and supper. 180 capsule 4 01/21/2018 at Unknown time  . loperamide (IMODIUM) 2 MG capsule Take 2 mg by mouth as needed.    unk at prn  . LORazepam (ATIVAN) 0.5 MG tablet Take 0.5 mg by mouth every 8 (eight) hours as needed for anxiety.   unk at prn  . metoprolol tartrate (LOPRESSOR) 25 MG tablet Take 12.5 mg by mouth 2 (two) times daily.   01/21/2018 at 0830  . pantoprazole (PROTONIX) 40 MG tablet Take 40 mg by mouth daily.   01/21/2018 at Unknown time  . tamsulosin (FLOMAX) 0.4 MG CAPS capsule Take 0.4 mg by mouth daily after supper.   01/20/2018  . vitamin B-6 (PYRIDOXINE) 25 MG tablet Take 25 mg by mouth 2 (two) times daily.   Past Week at Unknown time  . diazepam (VALIUM) 5 MG tablet Give one hour prior to exam. (Patient not taking: Reported on 01/22/2018) 1 tablet 0 Not Taking at Unknown time  . IRON PO Take by mouth.   Not Taking at Unknown time  . METOPROLOL SUCCINATE PO Take by mouth.   Not Taking at Unknown time    Results for orders placed or performed during the hospital encounter of 01/21/18 (from the past 48 hour(s))  CBC with Differential/Platelet     Status: Abnormal   Collection Time: 01/26/18  2:16 AM  Result Value Ref Range   WBC 22.9 (H) 4.0 - 10.5 K/uL   RBC 3.39 (L) 4.22 - 5.81 MIL/uL   Hemoglobin 9.1 (L) 13.0 - 17.0 g/dL   HCT 29.0 (L) 39.0 - 52.0 %   MCV 85.5 78.0 - 100.0 fL   MCH 26.8 26.0 - 34.0 pg   MCHC 31.4 30.0 - 36.0 g/dL   RDW 17.6 (H) 11.5 - 15.5 %   Platelets 216 150 - 400 K/uL   Neutrophils Relative % 92 %   Neutro Abs 20.9 (H) 1.7 - 7.7 K/uL   Lymphocytes Relative 2 %    Lymphs Abs 0.6 (L) 0.7 - 4.0 K/uL   Monocytes Relative 5 %   Monocytes Absolute 1.2 (H) 0.1 - 1.0 K/uL   Eosinophils Relative 0 %   Eosinophils Absolute 0.0 0.0 - 0.7 K/uL   Basophils Relative 0 %   Basophils Absolute 0.0 0.0 - 0.1 K/uL   Immature Granulocytes 1 %   Abs Immature Granulocytes 0.2 (H) 0.0 - 0.1 K/uL    Comment: Performed at [facility], [address], [place], [place] [address]  Comprehensive metabolic panel     Status: Abnormal   Collection Time: 01/26/18  2:16 AM  Result Value Ref Range   Sodium 137 135 - 145 mmol/L   Potassium 4.4 3.5 - 5.1 mmol/L   Chloride 107 98 - 111 mmol/L   CO2 23 22 - 32 mmol/L   Glucose, Bld 149 (H) 70 - 99 mg/dL   BUN 21 8 - 23 mg/dL   Creatinine, Ser 1.29 (H) 0.61 - 1.24 mg/dL   Calcium 8.7 (L) 8.9 - 10.3 mg/dL   Total Protein 5.5 (L) 6.5 - 8.1 g/dL   Albumin 2.7 (L) 3.5 - 5.0 g/dL   AST 19 15 - 41 U/L   ALT 15 0 - 44 U/L   Alkaline Phosphatase 67 38 - 126 U/L   Total Bilirubin 0.7 0.3 - 1.2 mg/dL   GFR calc non Af Amer 52 (L) >60 mL/min   GFR calc Af Amer >60 >60 mL/min    Comment: (NOTE) The eGFR has been calculated using the CKD EPI equation. This calculation has not been validated in all clinical situations. eGFR's persistently <60 mL/min signify possible Chronic Kidney Disease.    Anion gap 7 5 - 15    Comment: Performed at [facility] [address], [place], [place] [address]  Procalcitonin - Baseline     Status: None   Collection Time: 01/26/18  6:50 AM  Result Value Ref Range   Procalcitonin <0.10 ng/mL    Comment:        Interpretation: PCT (Procalcitonin) <= 0.5 ng/mL: Systemic infection (sepsis) is not likely. Local bacterial infection is possible. (NOTE)       Sepsis PCT Algorithm           Lower Respiratory Tract                                      Infection PCT Algorithm    ----------------------------     ----------------------------         PCT < 0.25 ng/mL                PCT < 0.10  ng/mL         Strongly encourage             Strongly discourage   discontinuation of antibiotics    initiation of antibiotics    ----------------------------     -----------------------------       PCT 0.25 - 0.50 ng/mL            PCT 0.10 - 0.25 ng/mL               OR       >80% decrease in PCT            Discourage initiation of                                            antibiotics      Encourage discontinuation           of antibiotics    ----------------------------     -----------------------------         PCT >= 0.50 ng/mL              PCT 0.26 -  0.50 ng/mL               AND        <80% decrease in PCT             Encourage initiation of                                             antibiotics       Encourage continuation           of antibiotics    ----------------------------     -----------------------------        PCT >= 0.50 ng/mL                  PCT > 0.50 ng/mL               AND         increase in PCT                  Strongly encourage                                      initiation of antibiotics    Strongly encourage escalation           of antibiotics                                     -----------------------------                                           PCT <= 0.25 ng/mL                                                 OR                                        > 80% decrease in PCT                                     Discontinue / Do not initiate                                             antibiotics Performed at [facility], [place] [address], [place], [place] [address]   Lactic acid, plasma     Status: None   Collection Time: 01/26/18  6:50 AM  Result Value Ref Range   Lactic Acid, Venous 1.6 0.5 - 1.9 mmol/L    Comment: Performed at [facility] [address], [place], [place] [address]  Lactic acid, plasma     Status: None   Collection Time: 01/26/18  9:35 AM  Result Value Ref  Range   Lactic Acid, Venous 1.8 0.5 - 1.9 mmol/L     Comment: Performed at [facility] [address], [place], [place] [address]  Hemoglobin and hematocrit, blood     Status: Abnormal   Collection Time: 01/26/18  6:46 PM  Result Value Ref Range   Hemoglobin 8.9 (L) 13.0 - 17.0 g/dL   HCT 28.1 (L) 39.0 - 52.0 %    Comment: Performed at [facility] [address], [place], [place] [address]  Glucose, capillary     Status: Abnormal   Collection Time: 01/26/18  7:37 PM  Result Value Ref Range   Glucose-Capillary 128 (H) 70 - 99 mg/dL  CBC with Differential/Platelet     Status: Abnormal   Collection Time: 01/27/18  2:24 AM  Result Value Ref Range   WBC 14.6 (H) 4.0 - 10.5 K/uL   RBC 3.30 (L) 4.22 - 5.81 MIL/uL   Hemoglobin 8.9 (L) 13.0 - 17.0 g/dL   HCT 27.9 (L) 39.0 - 52.0 %   MCV 84.5 78.0 - 100.0 fL   MCH 27.0 26.0 - 34.0 pg   MCHC 31.9 30.0 - 36.0 g/dL   RDW 18.0 (H) 11.5 - 15.5 %   Platelets 217 150 - 400 K/uL   Neutrophils Relative % 83 %   Neutro Abs 11.9 (H) 1.7 - 7.7 K/uL   Lymphocytes Relative 5 %   Lymphs Abs 0.8 0.7 - 4.0 K/uL   Monocytes Relative 11 %   Monocytes Absolute 1.6 (H) 0.1 - 1.0 K/uL   Eosinophils Relative 0 %   Eosinophils Absolute 0.1 0.0 - 0.7 K/uL   Basophils Relative 0 %   Basophils Absolute 0.0 0.0 - 0.1 K/uL   Immature Granulocytes 1 %   Abs Immature Granulocytes 0.2 (H) 0.0 - 0.1 K/uL    Comment: Performed at [facility], [address] [place], [place] [address]  Comprehensive metabolic panel     Status: Abnormal   Collection Time: 01/27/18  2:24 AM  Result Value Ref Range   Sodium 137 135 - 145 mmol/L   Potassium 4.2 3.5 - 5.1 mmol/L   Chloride 107 98 - 111 mmol/L   CO2 25 22 - 32 mmol/L   Glucose, Bld 92 70 - 99 mg/dL   BUN 22 8 - 23 mg/dL   Creatinine, Ser 1.29 (H) 0.61 - 1.24 mg/dL   Calcium 8.5 (L) 8.9 - 10.3 mg/dL   Total Protein 4.9 (L) 6.5 - 8.1 g/dL   Albumin 2.4 (L) 3.5 - 5.0 g/dL   AST 21 15 - 41 U/L   ALT 15 0 - 44 U/L   Alkaline Phosphatase 65 38 - 126  U/L   Total Bilirubin 0.5 0.3 - 1.2 mg/dL   GFR calc non Af Amer 52 (L) >60 mL/min   GFR calc Af Amer >60 >60 mL/min    Comment: (NOTE) The eGFR has been calculated using the CKD EPI equation. This calculation has not been validated in all clinical situations. eGFR's persistently <60 mL/min signify possible Chronic Kidney Disease.    Anion gap 5 5 - 15    Comment: Performed at [facility] [address] [place], [place] [address]  Magnesium     Status: None   Collection Time: 01/27/18  2:24 AM  Result Value Ref Range   Magnesium 1.7 1.7 - 2.4 mg/dL    Comment: Performed at [facility], [place],  [place] [address]    Dg Chest 1 View  Result Date: 01/26/2018 CLINICAL DATA:  Status post right thoracentesis. EXAM: CHEST  1 VIEW COMPARISON:  01/26/2018 FINDINGS: Loculated right pleural effusion. No evidence of pneumothorax post thoracentesis. Chronic elevation of left hemidiaphragm. Stably enlarged cardiac silhouette. Mediastinal contours appear intact. 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 Low lung volumes. Lateral humeral prosthesis, partially visualized. Soft tissues are grossly normal. IMPRESSION: Loculated right pleural effusion. No evidence of pneumothorax post thoracentesis. Stably enlarged cardiac silhouette. Electronically Signed   By: Fidela Salisbury M.D.   On: 01/26/2018 13:13   Dg Chest Port 1 View  Result Date: 01/26/2018 CLINICAL DATA:  Shortness of Breath EXAM: PORTABLE CHEST 1 VIEW COMPARISON:  01/22/2018 FINDINGS: Left pacer remains in place, unchanged. Right base atelectasis with small right effusion. No confluent opacity on the left. Heart is normal size. IMPRESSION: Small right effusion with right base atelectasis. Electronically Signed   By: Rolm Baptise M.D.   On: 01/26/2018 07:36   Ir Thoracentesis Asp Pleural Space W/img Guide  Result Date: 01/26/2018 INDICATION: Recurrent right sided pleural effusion with increase  dyspnea. EXAM: ULTRASOUND GUIDED RIGHT THORACENTESIS MEDICATIONS: 10 mL 2% lidocaine. COMPLICATIONS: None immediate. PROCEDURE: An ultrasound guided thoracentesis was thoroughly discussed with the patient and questions answered. The benefits, risks, alternatives and complications were also discussed. The patient understands and wishes to proceed with the procedure. Written consent was obtained. Ultrasound was performed to localize and mark an adequate pocket of fluid in the right chest. The area was then prepped and draped in the normal sterile fashion. 2% Lidocaine was used for local anesthesia. Under ultrasound guidance a 6 Fr Safe-T-Centesis catheter was introduced. Thoracentesis was performed. The catheter was removed and a dressing applied. FINDINGS: A total of approximately 2.5L of bloody fluid was removed. Samples were sent to the laboratory as requested by the clinical team. IMPRESSION: Successful ultrasound guided right thoracentesis yielding of pleural fluid. Read by Candiss Norse, PA-C Electronically Signed   By: Aletta Edouard M.D.   On: 01/26/2018 13:25    Review of Systems  Constitutional: Positive for malaise/fatigue.       Poor appetite  Respiratory: Positive for cough and shortness of breath. Negative for hemoptysis.   Cardiovascular: Negative for chest pain.   Blood pressure 100/67, pulse 68, temperature 98.5 F (36.9 C), temperature source Oral, resp. rate (!) 30, height 6' (1.829 m), weight 203 lb (92.1 kg), SpO2 98 %. Physical Exam  Vitals reviewed. Constitutional: He is oriented to person, place, and time. No distress.  Elderly frail appearing  HENT:  Head: Normocephalic and atraumatic.  Mouth/Throat: No oropharyngeal exudate.  Eyes: Conjunctivae and EOM are normal. No scleral icterus.  Neck: No thyromegaly present.  Cardiovascular:  Murmur heard. irregular  Respiratory: Effort normal and breath sounds normal. No respiratory distress. He has no wheezes.  GI: Soft.  He exhibits no distension. There is no tenderness.  Musculoskeletal: He exhibits no edema.  Lymphadenopathy:    He has no cervical adenopathy.  Neurological: He is alert and oriented to person, place, and time. No cranial nerve deficit. He exhibits normal muscle tone.  Skin: Skin is warm and dry.    Assessment/Plan: 77 yo man with multiple medical problems including melanoma, clear cell renal cell cancer, atrial fibrillation, and moderate aortic stenosis presents with a recurrent right pleural effusion. The effusion is almost certainly malignant and appears to be rapidly progressive with nodularity of the pleura noted on recent CT not visible  on CT from a month ago.  He is a relatively high risk surgical candidate given his cardiac issues and general frailty.   Cytology is pending on pleural fluid. If positive the best option for him would be a pleural catheter to manage the effusion. If cytology nondiagnostic could consider VATS for pleural biopsy and placement of a catheter.  Will follow up after cytology reported    Melrose Nakayama 01/27/2018, 7:25 PM

## 2018-01-28 ENCOUNTER — Inpatient Hospital Stay (HOSPITAL_COMMUNITY): Payer: Medicare Other

## 2018-01-28 DIAGNOSIS — R918 Other nonspecific abnormal finding of lung field: Secondary | ICD-10-CM

## 2018-01-28 LAB — CBC WITH DIFFERENTIAL/PLATELET
Abs Immature Granulocytes: 0.5 10*3/uL — ABNORMAL HIGH (ref 0.0–0.1)
Basophils Absolute: 0 10*3/uL (ref 0.0–0.1)
Basophils Relative: 0 %
EOS ABS: 0.1 10*3/uL (ref 0.0–0.7)
EOS PCT: 1 %
HEMATOCRIT: 27.8 % — AB (ref 39.0–52.0)
HEMOGLOBIN: 8.8 g/dL — AB (ref 13.0–17.0)
Immature Granulocytes: 3 %
LYMPHS ABS: 0.7 10*3/uL (ref 0.7–4.0)
LYMPHS PCT: 4 %
MCH: 26.8 pg (ref 26.0–34.0)
MCHC: 31.7 g/dL (ref 30.0–36.0)
MCV: 84.8 fL (ref 78.0–100.0)
MONOS PCT: 12 %
Monocytes Absolute: 2 10*3/uL — ABNORMAL HIGH (ref 0.1–1.0)
Neutro Abs: 13.6 10*3/uL — ABNORMAL HIGH (ref 1.7–7.7)
Neutrophils Relative %: 80 %
Platelets: 207 10*3/uL (ref 150–400)
RBC: 3.28 MIL/uL — ABNORMAL LOW (ref 4.22–5.81)
RDW: 18.1 % — AB (ref 11.5–15.5)
WBC: 16.9 10*3/uL — ABNORMAL HIGH (ref 4.0–10.5)

## 2018-01-28 LAB — COMPREHENSIVE METABOLIC PANEL
ALBUMIN: 2.4 g/dL — AB (ref 3.5–5.0)
ALT: 15 U/L (ref 0–44)
AST: 21 U/L (ref 15–41)
Alkaline Phosphatase: 71 U/L (ref 38–126)
Anion gap: 7 (ref 5–15)
BUN: 25 mg/dL — ABNORMAL HIGH (ref 8–23)
CHLORIDE: 106 mmol/L (ref 98–111)
CO2: 25 mmol/L (ref 22–32)
CREATININE: 1.48 mg/dL — AB (ref 0.61–1.24)
Calcium: 8.3 mg/dL — ABNORMAL LOW (ref 8.9–10.3)
GFR calc Af Amer: 51 mL/min — ABNORMAL LOW (ref >60)
GFR calc non Af Amer: 44 mL/min — ABNORMAL LOW (ref >60)
GLUCOSE: 100 mg/dL — AB (ref 70–99)
POTASSIUM: 4.2 mmol/L (ref 3.5–5.1)
Sodium: 138 mmol/L (ref 135–145)
Total Bilirubin: 0.5 mg/dL (ref 0.3–1.2)
Total Protein: 5 g/dL — ABNORMAL LOW (ref 6.5–8.1)

## 2018-01-28 LAB — GLUCOSE, CAPILLARY: Glucose-Capillary: 128 mg/dL — ABNORMAL HIGH (ref 70–99)

## 2018-01-28 LAB — PROCALCITONIN: PROCALCITONIN: 0.1 ng/mL

## 2018-01-28 MED ORDER — FUROSEMIDE 10 MG/ML IJ SOLN
40.0000 mg | Freq: Once | INTRAMUSCULAR | Status: AC
  Administered 2018-01-28: 40 mg via INTRAVENOUS
  Filled 2018-01-28: qty 4

## 2018-01-28 MED ORDER — MEGESTROL ACETATE 400 MG/10ML PO SUSP
400.0000 mg | Freq: Two times a day (BID) | ORAL | Status: DC
Start: 2018-01-28 — End: 2018-02-07
  Administered 2018-01-28 – 2018-02-07 (×17): 400 mg via ORAL
  Filled 2018-01-28 (×23): qty 10

## 2018-01-28 MED ORDER — LEVOFLOXACIN IN D5W 500 MG/100ML IV SOLN
500.0000 mg | INTRAVENOUS | Status: DC
  Filled 2018-01-28: qty 100

## 2018-01-28 MED ORDER — SENNOSIDES-DOCUSATE SODIUM 8.6-50 MG PO TABS
2.0000 | ORAL_TABLET | Freq: Two times a day (BID) | ORAL | Status: DC
  Administered 2018-01-29 – 2018-02-07 (×13): 2 via ORAL
  Filled 2018-01-28 (×15): qty 2

## 2018-01-28 MED ORDER — SODIUM CHLORIDE 0.9% IV SOLUTION
Freq: Once | INTRAVENOUS | Status: AC
  Administered 2018-01-28: 1 mL via INTRAVENOUS

## 2018-01-28 MED ORDER — POLYETHYLENE GLYCOL 3350 17 G PO PACK
17.0000 g | PACK | Freq: Every day | ORAL | Status: DC
  Administered 2018-01-28 – 2018-01-31 (×4): 17 g via ORAL
  Filled 2018-01-28 (×4): qty 1

## 2018-01-28 MED ORDER — SODIUM CHLORIDE 0.9 % IV SOLN
2.0000 g | INTRAVENOUS | Status: DC
  Administered 2018-01-28 – 2018-02-01 (×6): 2 g via INTRAVENOUS
  Filled 2018-01-28 (×6): qty 2

## 2018-01-28 NOTE — Progress Notes (Addendum)
Pharmacy Antibiotic Note  Barry Curry is a 77 y.o. male admitted on 01/21/2018 with respiratory failure 2/2 pleural effusion. Now with new RLL consolidation on CXR and persistent leukocytosis, concern for possible HCAP. Pharmacy has been consulted for cefepime dosing. WBC 16.9, currently AF. Scr 1.48 (BL ~1.3-1.5), estimated CrCl ~45 mL/min.  Plan: Cefepime 2g IV q24h F/u ID work-up, clinical status, C&S, de-escalation, LOT  Height: 6' (182.9 cm) Weight: 203 lb (92.1 kg) IBW/kg (Calculated) : 77.6  Temp (24hrs), Avg:98 F (36.7 C), Min:97.4 F (36.3 C), Max:98.5 F (36.9 C)  Recent Labs  Lab 01/24/18 0752 01/25/18 0224 01/26/18 0216 01/26/18 0650 01/26/18 0935 01/27/18 0224 01/28/18 0310  WBC 12.2* 10.7* 22.9*  --   --  14.6* 16.9*  CREATININE 1.29* 1.35* 1.29*  --   --  1.29* 1.48*  LATICACIDVEN  --   --   --  1.6 1.8  --   --     Estimated Creatinine Clearance: 45.9 mL/min (A) (by C-G formula based on SCr of 1.48 mg/dL (H)).    Allergies  Allergen Reactions  . Penicillins Rash    Has patient had a PCN reaction causing immediate rash, facial/tongue/throat swelling, SOB or lightheadedness with hypotension: No Has patient had a PCN reaction causing severe rash involving mucus membranes or skin necrosis: No Has patient had a PCN reaction that required hospitalization: No Has patient had a PCN reaction occurring within the last 10 years: No If all of the above answers are "NO", then may proceed with Cephalosporin use.    Thank you for allowing pharmacy to be a part of this patient's care.  Mila Merry Gerarda Fraction, PharmD PGY2 Infectious Diseases Pharmacy Resident Phone: [phone] 01/28/2018 2:24 PM

## 2018-01-28 NOTE — Progress Notes (Signed)
      [address]       [place] [address]             [phone]       Does not feel well today  Currently receiving blood transfusion  Cytology is negative on both pleural specimens  Will need a VATS for pleural biopsy. A pleural catheter can be placed at the same time or pleurodesis could be considered.  Unfortunately I will be out of town all next week. Hopefully one of my partners will be able to do the procedure  Remo Lipps C. Roxan Hockey, MD [facility] [phone]

## 2018-01-28 NOTE — Progress Notes (Signed)
I spoke to the pathologist yesterday.  He said the fluid that was taken off on July 27 did not show any obvious malignant cells.  However, he was going to run some special stains.  The fluid that was taken out on the 30th probably would not be read for another day or so.  I very much appreciate the expert advice and recommendations from Dr. Roxan Hockey.  I really do believe that we are going to need a VATS procedure on him.  We will really going need to get tissue.  Nowadays, with any malignancy, particularly if we are thinking about renal cell carcinoma, we really need to get tissue that we can run molecular studies.  A lot of times, cytology is just not adequate enough to run molecular studies.  The pleural-based mass seen on the recent CT angiogram is quite troublesome.  I does have a hard time believing that he had renal cell removed 7 months ago and now it has metastasized.  I just wonder if he may not have a second malignancy.  His hemoglobin is 8.8.  I really think that he would benefit from 2 units of blood.  I think given his cardiac issues, blood will help.  He is not eating much.  We really need to improve his appetite.  I will try him on some Megace elixir to see if this can help a little bit.  His white cell count is 16.9.  His platelet count is 207,000.  His creatinine is 1.48.  His potassium is 4.2.  Calcium is 8.3.  There really is now much change in his overall physical exam.  The pathologist said that he will have the special stains done today and will notify me.  We will go ahead and transfuse 2 units of blood.  I think this will be quite helpful.  It may also improve his appetite.  I talked to him about the blood transfusion.  His daughter was present also.  I explained why I thought blood would be helpful.  They are in agreement.  Again, ultimately, I still feel that a VATS is going to be necessary for [place] to get tissue so that we can run molecular assays if we do have  cancer.  Lattie Haw, MD  [place] 3:25-26

## 2018-01-28 NOTE — Progress Notes (Signed)
Physical Therapy Treatment Patient Details Name: Barry Curry MRN: [id number] DOB: 1941-01-14 Today's Date: 01/28/2018    History of Present Illness 77 y.o. male with medical history significant of hypertension, hyperlipidemia, GERD, depression, anxiety, atrial fibrillation not on anticoagulants, CAD, PVC, aortic stenosis, right bundle blockage, iron deficiency anemia, CKD, clear renal cell cancer (left nephrectomy), melanoma, who presents with shortness of breath. CT reveals Large RIGHT pleural effusion with RIGHT pleural/chest wall nodules. New RIGHT internal mammary lymphadenopathy, small pulmonary nodules and 2.3 cm RIGHT adrenal nodule are highly suspicious for metastatic disease. on 01/22/2018 patient had a ultrasound-guided right thoracentesis with 2 L of bloody fluid removed.      PT Comments    Patient not progressing this session due to feeling weak in hallway with legs buckling and needing to sit, once in room BP measured 50's/40's improved once supine 90's/40's.  Continue to recommend SNF level rehab at d/c.  Encouraged despite difficulties with ambulation due to continued work up into his condition and likely low hemoglobin as culprit for issues today.  PT will continue to follow.    Follow Up Recommendations  SNF     Equipment Recommendations  Other (comment)(TBA)    Recommendations for Other Services       Precautions / Restrictions Precautions Precautions: Fall Precaution Comments: watch BP (orthostatic) Restrictions Weight Bearing Restrictions: No    Mobility  Bed Mobility Overal bed mobility: Needs Assistance Bed Mobility: Supine to Sit     Supine to sit: Min guard     General bed mobility comments: min guard for safety, increased time and effort  Transfers Overall transfer level: Needs assistance Equipment used: Rolling walker (2 wheeled) Transfers: Sit to/from Stand Sit to Stand: Min assist         General transfer comment: assist for balance,  cues for hand placement  Ambulation/Gait Ambulation/Gait assistance: Min assist Gait Distance (Feet): 40 Feet(x 2) Assistive device: Rolling walker (2 wheeled) Gait Pattern/deviations: Step-through pattern;Decreased stride length;Shuffle;Trunk flexed     General Gait Details: difficulty holding his head upright, felt weak and LE's buckling so RN brought chair for pt to sit in hallway.  Able to recover and return to EOB with sitting BP 53/41 once supine 95/43 RN aware  HR max 123 SpO2 92% on 2L O2 RR 22   Stairs             Wheelchair Mobility    Modified Rankin (Stroke Patients Only)       Balance Overall balance assessment: Needs assistance   Sitting balance-Leahy Scale: Good     Standing balance support: Bilateral upper extremity supported Standing balance-Leahy Scale: Poor Standing balance comment: heavy UE support for balance                            Cognition Arousal/Alertness: Awake/alert Behavior During Therapy: WFL for tasks assessed/performed Overall Cognitive Status: Within Functional Limits for tasks assessed                                        Exercises      General Comments        Pertinent Vitals/Pain Pain Assessment: No/denies pain    Home Living                      Prior Function  PT Goals (current goals can now be found in the care plan section) Progress towards PT goals: Not progressing toward goals - comment(weakness and orthostatic BP)    Frequency    Min 2X/week      PT Plan Current plan remains appropriate    Co-evaluation              AM-PAC PT "6 Clicks" Daily Activity  Outcome Measure  Difficulty turning over in bed (including adjusting bedclothes, sheets and blankets)?: A Little Difficulty moving from lying on back to sitting on the side of the bed? : Unable Difficulty sitting down on and standing up from a chair with arms (e.g., wheelchair, bedside  commode, etc,.)?: Unable Help needed moving to and from a bed to chair (including a wheelchair)?: A Little Help needed walking in hospital room?: A Lot Help needed climbing 3-5 steps with a railing? : Total 6 Click Score: 11    End of Session Equipment Utilized During Treatment: Gait belt;Oxygen Activity Tolerance: Treatment limited secondary to medical complications (Comment)(orthostatic) Patient left: in bed;with call bell/phone within reach Nurse Communication: Other (comment)(BP) PT Visit Diagnosis: Unsteadiness on feet (R26.81);Other abnormalities of gait and mobility (R26.89);Muscle weakness (generalized) (M62.81);Difficulty in walking, not elsewhere classified (R26.2)     Time: 1216-2446 PT Time Calculation (min) (ACUTE ONLY): 27 min  Charges:  $Gait Training: 8-22 mins $Therapeutic Activity: 8-22 mins                     [place], [place] [phone] 01/28/2018    Reginia Naas 01/28/2018, 12:04 PM

## 2018-01-28 NOTE — Progress Notes (Signed)
PROGRESS NOTE    Barry Curry  ZOX:[id number] DOB: February 26, 1941 DOA: 01/21/2018 PCP: [name], PA-C    Brief Narrative:  Barry Curry a 77 y.o.malewith medical history significant ofhypertension, hyperlipidemia, GERD, depression, anxiety, atrial fibrillation not on anticoagulant, CAD, PVC, aortic stenosis, RBBB, iron deficiency anemia, CKD, clear renal cell cancer (left nephrectomy), melanoma, OSA who presents with shortness of breath.  3 weeks prior had a right thoracentesis at [facility] with 670 cc of bloody fluid removed.  At this admission on 01/22/2018 patient had a ultrasound-guided right thoracentesis with 2 L of bloody fluid removed.  Pleural fluid sent for Gram stain, culture, cytology.  Pulmonology consulted to assess for recurrence of pleural effusion.  Exudative pleural fluid.  Gram stain negative.  Cytology still pending. He underwent repeat thoracentesis on 7/31 for recurrent pleural effusion,  with cytology showing reactive mesothelial cells.     Assessment & Plan:   Principal Problem:   Recurrent right pleural effusion Active Problems:   Essential hypertension   GERD   Acute respiratory failure with hypoxia (HCC)   PAF (paroxysmal atrial fibrillation) (HCC)   Clear cell carcinoma of kidney, left (HCC)   Anxiety   CAD (coronary artery disease)   Iron deficiency anemia   Elevated troponin   CKD (chronic kidney disease), stage III (HCC)   Malnutrition of moderate degree   Acute respiratory failure with hypoxia secondary to recurrent pleural effusion, possibly malignant effusion  - IR guided thoracentesis three times in the last  30 days.  - exudative, gram stain negative, culture negative and cytology showing reactive mesothelial cells  - [place] oxygen to keep sats greater than 90%. Currently on 2 lit of [place] oxygen.  Pt tachypneic today and repeat CXR shows recurrent right sided effusion with right lower lobe consolidation. He is afebrile and  has persistent leukocytosis.  His lactic acid two days ago was wnl.  Get pro calcitonin level, will start him on levaquin empirically.  Request cardio thoracic surgery for pleural catheter vs VATS with pleural biopsy.  Paroxysmal atrial fibrillation:  Rate controlled. On metoprolol and dronedarone.   Iron deficiency anemia/ acute blood loss anemia:  Hemoglobin stabilized around 8.  IV iron and aranesp ordered by oncology.  Transfuse to keep hemoglobin less than 8.  Dr Marin Olp ordered 2 units of prbc transfusion and repeat hemoglobin tomorrow.   Hypertension:  Well controlled.   CAD: no chest pain or sob. Resume BB.   GERD stable.   Mild  Acute on Stage 3 CKD.  Creatinine at 1.48.   Leukocytosis: suspect reactive. Get pro calcitonin.  Afebrile, no dysuria symptoms. Urine culture report grew 20,000 pseudomonas.  Blood cultures have been negative so far.  Pleural fluid culture negative.  Cytology from pleural fluid analysis shows reactive mesothelial cells.    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  Dr Marin Olp on board.  Follow up with PET/CT scan for biopsy of the adrenal nodules and pulmonary nodules.    Moderate Protein Calorie malnutrition:  - nutrition consulted.     DVT prophylaxis: SCD'S Code Status: full code.  Family Communication: none at bedside Disposition Plan: pending clinical improvement.    Consultants:   Oncology  Tcts.    Procedures: IR thoracentesis on 7/31  Antimicrobials: none.   Subjective: Pt has tachypnea, requesting medication to sleep.   Objective: Vitals:   01/27/18 2322 01/28/18 0200 01/28/18 0400 01/28/18 0717  BP: (!) 88/75 97/62  113/65 116/69  Pulse: 91 88 82 80  Resp: (!) 27 (!) 34 (!) 38 (!) 37  Temp: 98.5 F (36.9 C)  (!) 97.5 F (36.4 C) 97.6 F (36.4 C)  TempSrc: Oral  Oral Oral  SpO2: 98% 97% 96% 100%  Weight:      Height:        Intake/Output Summary (Last 24  hours) at 01/28/2018 0759 Last data filed at 01/27/2018 1900 Gross per 24 hour  Intake 720 ml  Output -  Net 720 ml   Filed Weights   01/21/18 1107  Weight: 92.1 kg (203 lb)    Examination:  General exam: tachypnea on 2 lit of [place] oxygen.  Respiratory system: diminished on the right side. No wheezing or rhonchi.  Cardiovascular system: S1 & S2 heard,irregular, murmer present.  Gastrointestinal system: Abdomen is soft non tender non distended bowel sounds heard.  Central nervous system: Alert and oriented, non focal.  Extremities: no cyanosis or clubbing.  Skin: No rashes, lesions or ulcers Psychiatry: . Mood & affect appropriate.     Data Reviewed: I have personally reviewed following labs and imaging studies  CBC: Recent Labs  Lab 01/24/18 0752 01/25/18 0224 01/26/18 0216 01/26/18 1846 01/27/18 0224 01/28/18 0310  WBC 12.2* 10.7* 22.9*  --  14.6* 16.9*  NEUTROABS 9.4* 8.4* 20.9*  --  11.9* 13.6*  HGB 9.5* 8.7* 9.1* 8.9* 8.9* 8.8*  HCT 31.1* 27.7* 29.0* 28.1* 27.9* 27.8*  MCV 88.9 85.8 85.5  --  84.5 84.8  PLT 182 178 216  --  217 932   Basic Metabolic Panel: Recent Labs  Lab 01/24/18 0752 01/25/18 0224 01/26/18 0216 01/27/18 0224 01/28/18 0310  NA 135 137 137 137 138  K 4.3 4.1 4.4 4.2 4.2  CL 103 107 107 107 106  CO2 19* 23 23 25 25   GLUCOSE 86 81 149* 92 100*  BUN 18 19 21 22  25*  CREATININE 1.29* 1.35* 1.29* 1.29* 1.48*  CALCIUM 8.8* 8.4* 8.7* 8.5* 8.3*  MG  --   --   --  1.7  --    GFR: Estimated Creatinine Clearance: 45.9 mL/min (A) (by C-G formula based on SCr of 1.48 mg/dL (H)). Liver Function Tests: Recent Labs  Lab 01/24/18 0752 01/25/18 0224 01/26/18 0216 01/27/18 0224 01/28/18 0310  AST 22 17 19 21 21   ALT 14 12 15 15 15   ALKPHOS 73 66 67 65 71  BILITOT 0.8 0.6 0.7 0.5 0.5  PROT 5.5* 5.3* 5.5* 4.9* 5.0*  ALBUMIN 2.9* 2.6* 2.7* 2.4* 2.4*   No results for input(s): LIPASE, AMYLASE in the last 168 hours. No results for input(s):  AMMONIA in the last 168 hours. Coagulation Profile: Recent Labs  Lab 01/21/18 1142  INR 1.08   Cardiac Enzymes: Recent Labs  Lab 01/21/18 1142 01/21/18 2225 01/22/18 0401 01/22/18 1001  TROPONINI 0.03* <0.03 0.03* <0.03   BNP (last 3 results) No results for input(s): PROBNP in the last 8760 hours. HbA1C: No results for input(s): HGBA1C in the last 72 hours. CBG: Recent Labs  Lab 01/26/18 1937  GLUCAP 128*   Lipid Profile: No results for input(s): CHOL, HDL, LDLCALC, TRIG, CHOLHDL, LDLDIRECT in the last 72 hours. Thyroid Function Tests: No results for input(s): TSH, T4TOTAL, FREET4, T3FREE, THYROIDAB in the last 72 hours. Anemia Panel: No results for input(s): VITAMINB12, FOLATE, FERRITIN, TIBC, IRON, RETICCTPCT in the last 72 hours. Sepsis Labs: Recent Labs  Lab 01/21/18 1147 01/26/18 0650 01/26/18 0935  PROCALCITON  --  <  0.10  --   LATICACIDVEN 1.50 1.6 1.8    Recent Results (from the past 240 hour(s))  Culture, blood (Routine x 2)     Status: None   Collection Time: 01/21/18 11:40 AM  Result Value Ref Range Status   Specimen Description   Final    BLOOD RIGHT ARM Performed at [facility], [address] [place], [place] [address]    Special Requests   Final    BOTTLES DRAWN AEROBIC AND ANAEROBIC Blood Culture adequate volume Performed at [facility], [address] [place], [place] [address]    Culture   Final    NO GROWTH 5 DAYS Performed at [facility], [place] [address], [place], [place] [address]    Report Status 01/26/2018 FINAL  Final  Culture, blood (Routine x 2)     Status: None   Collection Time: 01/21/18 11:50 AM  Result Value Ref Range Status   Specimen Description   Final    BLOOD LEFT ARM Performed at [facility], [address] [place], [place] [address]    Special Requests   Final    BOTTLES DRAWN AEROBIC AND ANAEROBIC Blood Culture results may not be optimal due to an inadequate volume of  blood received in culture bottles Performed at [facility], [address] [place], [place] [address]    Culture   Final    NO GROWTH 5 DAYS Performed at [facility], [place] [address] [place], [place] [address]    Report Status 01/26/2018 FINAL  Final  Urine culture     Status: Abnormal   Collection Time: 01/21/18  2:32 PM  Result Value Ref Range Status   Specimen Description   Final    URINE, CLEAN CATCH Performed at [facility], [address] [place], [place] [address]    Special Requests   Final    NONE Performed at [facility], [address] [place], [place] [address]    Culture 20,000 COLONIES/mL PSEUDOMONAS AERUGINOSA (A)  Final   Report Status 01/24/2018 FINAL  Final   Organism ID, Bacteria PSEUDOMONAS AERUGINOSA (A)  Final      Susceptibility   Pseudomonas aeruginosa - MIC*    CEFTAZIDIME 2 SENSITIVE Sensitive     CIPROFLOXACIN 1 SENSITIVE Sensitive     GENTAMICIN 4 SENSITIVE Sensitive     IMIPENEM 2 SENSITIVE Sensitive     PIP/TAZO <=4 SENSITIVE Sensitive     CEFEPIME 8 SENSITIVE Sensitive     * 20,000 COLONIES/mL PSEUDOMONAS AERUGINOSA  MRSA PCR Screening     Status: None   Collection Time: 01/21/18  8:47 PM  Result Value Ref Range Status   MRSA by PCR NEGATIVE NEGATIVE Final    Comment:        The GeneXpert MRSA Assay (FDA approved for NASAL specimens only), is one component of a comprehensive MRSA colonization surveillance program. It is not intended to diagnose MRSA infection nor to guide or monitor treatment for MRSA infections. Performed at [facility], [place] [address] [place], [place] [address]   Culture, body fluid-bottle     Status: None   Collection Time: 01/22/18  2:59 PM  Result Value Ref Range Status   Specimen Description PLEURAL RIGHT  Final   Special Requests NONE  Final   Culture   Final    NO GROWTH 5 DAYS Performed at [facility], [place] [address], [place], [place] [address]      Report Status 01/27/2018 FINAL  Final  Gram stain     Status: None   Collection Time: 01/22/18  2:59 PM  Result Value Ref Range Status   Specimen Description PLEURAL RIGHT  Final   Special Requests NONE  Final   Gram Stain   Final    FEW WBC PRESENT, PREDOMINANTLY MONONUCLEAR NO ORGANISMS SEEN Performed at [facility], [place] [address], [place], [place] [address]    Report Status 01/22/2018 FINAL  Final         Radiology Studies: Dg Chest 1 View  Result Date: 01/26/2018 CLINICAL DATA:  Status post right thoracentesis. EXAM: CHEST  1 VIEW COMPARISON:  01/26/2018 FINDINGS: Loculated right pleural effusion. No evidence of pneumothorax post thoracentesis. Chronic elevation of left hemidiaphragm. Stably enlarged cardiac silhouette. Mediastinal contours appear intact. 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 Low lung volumes. Lateral humeral prosthesis, partially visualized. Soft tissues are grossly normal. IMPRESSION: Loculated right pleural effusion. No evidence of pneumothorax post thoracentesis. Stably enlarged cardiac silhouette. Electronically Signed   By: Fidela Salisbury M.D.   On: 01/26/2018 13:13   Ir Thoracentesis Asp Pleural Space W/img Guide  Result Date: 01/26/2018 INDICATION: Recurrent right sided pleural effusion with increase dyspnea. EXAM: ULTRASOUND GUIDED RIGHT THORACENTESIS MEDICATIONS: 10 mL 2% lidocaine. COMPLICATIONS: None immediate. PROCEDURE: An ultrasound guided thoracentesis was thoroughly discussed with the patient and questions answered. The benefits, risks, alternatives and complications were also discussed. The patient understands and wishes to proceed with the procedure. Written consent was obtained. Ultrasound was performed to localize and mark an adequate pocket of fluid in the right chest. The area was then prepped and draped in the normal sterile fashion. 2% Lidocaine was used for local anesthesia. Under ultrasound guidance a 6 Fr  Safe-T-Centesis catheter was introduced. Thoracentesis was performed. The catheter was removed and a dressing applied. FINDINGS: A total of approximately 2.5L of bloody fluid was removed. Samples were sent to the laboratory as requested by the clinical team. IMPRESSION: Successful ultrasound guided right thoracentesis yielding of pleural fluid. Read by Candiss Norse, PA-C Electronically Signed   By: Aletta Edouard M.D.   On: 01/26/2018 13:25        Scheduled Meds: . sodium chloride   Intravenous Once  . dronedarone  400 mg Oral BID WC  . feeding supplement (ENSURE ENLIVE)  237 mL Oral BID BM  . ferrous sulfate  325 mg Oral Q breakfast  . fluticasone  2 spray Each Nare QHS  . folic acid  2 mg Oral Daily  . furosemide  40 mg Intravenous Once  . lipase/protease/amylase  72,000 Units Oral BID AC  . mouth rinse  15 mL Mouth Rinse BID  . megestrol  400 mg Oral BID  . metoprolol succinate  25 mg Oral Daily  . pantoprazole  40 mg Oral Daily  . tamsulosin  0.4 mg Oral QPC supper  . vitamin B-6  25 mg Oral BID   Continuous Infusions:   LOS: 6 days    Time spent: 35 minutes.     Hosie Poisson, MD [facility] Pager [phone]  If 7PM-7AM, please contact night-coverage [email or web address] Password [email or web address] 01/28/2018, 7:59 AM

## 2018-01-28 NOTE — Progress Notes (Signed)
Patient respiratory rate consistently in 30's-40's. Denies SOB or changes in breathing. Pt on 2L [place] O2 sat 98%. All other VS stable. MD paged and made aware.

## 2018-01-29 ENCOUNTER — Inpatient Hospital Stay (HOSPITAL_COMMUNITY): Payer: Medicare Other

## 2018-01-29 DIAGNOSIS — Z85528 Personal history of other malignant neoplasm of kidney: Secondary | ICD-10-CM

## 2018-01-29 LAB — TYPE AND SCREEN
ABO/RH(D): A POS
ANTIBODY SCREEN: NEGATIVE
Unit division: 0
Unit division: 0

## 2018-01-29 LAB — CBC WITH DIFFERENTIAL/PLATELET
Abs Immature Granulocytes: 0.5 10*3/uL — ABNORMAL HIGH (ref 0.0–0.1)
Basophils Absolute: 0.1 10*3/uL (ref 0.0–0.1)
Basophils Relative: 0 %
EOS PCT: 1 %
Eosinophils Absolute: 0.2 10*3/uL (ref 0.0–0.7)
HEMATOCRIT: 34.1 % — AB (ref 39.0–52.0)
HEMOGLOBIN: 10.9 g/dL — AB (ref 13.0–17.0)
Immature Granulocytes: 3 %
LYMPHS ABS: 0.8 10*3/uL (ref 0.7–4.0)
LYMPHS PCT: 4 %
MCH: 27.5 pg (ref 26.0–34.0)
MCHC: 32 g/dL (ref 30.0–36.0)
MCV: 86.1 fL (ref 78.0–100.0)
MONO ABS: 2 10*3/uL — AB (ref 0.1–1.0)
MONOS PCT: 11 %
Neutro Abs: 15.3 10*3/uL — ABNORMAL HIGH (ref 1.7–7.7)
Neutrophils Relative %: 81 %
Platelets: 205 10*3/uL (ref 150–400)
RBC: 3.96 MIL/uL — ABNORMAL LOW (ref 4.22–5.81)
RDW: 17.6 % — ABNORMAL HIGH (ref 11.5–15.5)
WBC: 18.8 10*3/uL — ABNORMAL HIGH (ref 4.0–10.5)

## 2018-01-29 LAB — BPAM RBC
BLOOD PRODUCT EXPIRATION DATE: 201908102359
BLOOD PRODUCT EXPIRATION DATE: 201908152359
ISSUE DATE / TIME: 201908021438
ISSUE DATE / TIME: 201908022125
Unit Type and Rh: 6200
Unit Type and Rh: 6200

## 2018-01-29 MED ORDER — MODAFINIL 100 MG PO TABS
ORAL_TABLET | ORAL | Status: AC
Start: 2018-01-29 — End: 2018-01-30
  Filled 2018-01-29: qty 1

## 2018-01-29 MED ORDER — LIDOCAINE HCL (PF) 1 % IJ SOLN
INTRAMUSCULAR | Status: AC
  Administered 2018-01-29: 13:00:00
  Filled 2018-01-29: qty 30

## 2018-01-29 MED ORDER — LIDOCAINE HCL (PF) 1 % IJ SOLN
INTRAMUSCULAR | Status: AC
  Filled 2018-01-29: qty 30

## 2018-01-29 NOTE — Procedures (Signed)
PROCEDURE SUMMARY:  Successful image-guided right thoracentesis. Yielded 800 milliliters of blood-tinged fluid. Patient tolerated procedure well. No immediate complications.  Specimen was sent for labs. CXR ordered.  Claris Pong Louk PA-C 01/29/2018 11:32 AM

## 2018-01-29 NOTE — Progress Notes (Signed)
Mr. Nazari feels a little bit better.  However, he is getting short of breath again.  I am sure that his pleural fluid is coming back.  Both sets of pleural effusions were negative for obvious malignancy.  However, I still am incredibly concerned about him having cancer.  He may have mesothelioma.  This may be a primary lung cancer.  Again, his kidney cancer can certainly come back although I would think this would be a little unusual for her to come back so quickly.  He got 2 units of blood yesterday.  This made him feel better.  His appetite is improved.  His labs shows white cell count to be 18.8.  Hemoglobin 10.9.  Platelet count 205K.  No electrolytes were done today.  He is not as tachycardic.  I totally agree with Dr. Roxan Hockey of thoracic surgery that he needs a VATS procedure.  Again, there is a pleural-based mass on the right pleura.  I would favor pleurodesis instead of a Pleurx catheter to help with the pleural fluid reaccumulation.  Hopefully, he will be able to have the VATS procedure early in the week.  His 2 daughters were with him this morning.  I talked to all of them.  They all understand what is going on and they agree that the VATS procedure is necessary.  Lattie Haw, MD  Psalm 56:4

## 2018-01-29 NOTE — Progress Notes (Signed)
PROGRESS NOTE    Barry Curry  GLO:[id number] DOB: Dec 09, 1940 DOA: 01/21/2018 PCP: [name], PA-C    Brief Narrative:  Barry Curry a 77 y.o.malewith medical history significant ofhypertension, hyperlipidemia, GERD, depression, anxiety, atrial fibrillation not on anticoagulant, CAD, PVC, aortic stenosis, RBBB, iron deficiency anemia, CKD, clear renal cell cancer (left nephrectomy), melanoma, OSA who presents with shortness of breath.  3 weeks prior had a right thoracentesis at [facility] with 670 cc of bloody fluid removed.  At this admission on 01/22/2018 patient had a ultrasound-guided right thoracentesis with 2 L of bloody fluid removed.  Pleural fluid sent for Gram stain, culture, cytology.  Pulmonology consulted to assess for recurrence of pleural effusion.  Exudative pleural fluid.  Gram stain negative.  Cytology still pending. He underwent repeat thoracentesis on 7/31 for recurrent pleural effusion,  with cytology showing reactive mesothelial cells.     Assessment & Plan:   Principal Problem:   Recurrent right pleural effusion Active Problems:   Essential hypertension   GERD   Acute respiratory failure with hypoxia (HCC)   PAF (paroxysmal atrial fibrillation) (HCC)   Clear cell carcinoma of kidney, left (HCC)   Anxiety   CAD (coronary artery disease)   Iron deficiency anemia   Elevated troponin   CKD (chronic kidney disease), stage III (HCC)   Malnutrition of moderate degree   Acute respiratory failure with hypoxia secondary to recurrent pleural effusion, possibly malignant effusion  - IR guided thoracentesis three times in the last  30 days.  - exudative, gram stain negative, culture negative and cytology showing reactive mesothelial cells  - [place] oxygen to keep sats greater than 90%. Currently on 2 lit of [place] oxygen.  His breathing is more shallow today and plan for thoracentesis today.   He is afebrile and has persistent leukocytosis.  His  lactic acid two days ago was wnl.  Pro calcitonin level is negative, indicative of non infective process. Will discontinue the cefepime after thoracentesis.  Requested cardio thoracic surgery for pleural catheter vs VATS with pleural biopsy.  Awaiting TCTS recommendations on the VATS with pleural biopsy and catheter placement with or without pleurodesis.   Paroxysmal atrial fibrillation:  Rate controlled. On metoprolol and dronedarone.   Iron deficiency anemia/ acute blood loss anemia:  IV iron and aranesp ordered by oncology.  He underwent 2 units of prbc transfusion and his repeat hemoglobin is at 10.9.  Pt reports transfusion helped his energy.   Hypertension:  Well controlled.   CAD: no chest pain or sob. Resume BB.   GERD stable.   Mild  Acute on Stage 3 CKD.  Creatinine at 1.48, repeat BMP today.   Leukocytosis: suspect reactive. Pro calcitonin is negative.  Afebrile, no dysuria symptoms. Urine culture report grew 20,000 pseudomonas.  Blood cultures have been negative so far.  Pleural fluid culture negative.  Cytology from pleural fluid analysis shows reactive mesothelial cells.     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  Dr Marin Olp on board.  Follow up with PET/CT scan for biopsy of the adrenal nodules and pulmonary nodules.    Moderate Protein Calorie malnutrition:  - nutrition consulted.     DVT prophylaxis: SCD'S Code Status: full code.  Family Communication: daughter at bedside.  Disposition Plan: pending clinical improvement and VATS next week.     Consultants:   Oncology  Tcts.    Procedures: IR thoracentesis on 7/31, 8/3  Antimicrobials: cefepime.  Subjective: On 2 lit of [place] oxygen.  No chest pain , has sob and having difficulty completing a sentence.  Coughing when talking.   Objective: Vitals:   01/29/18 0600 01/29/18 0700 01/29/18 0725 01/29/18 0800  BP:   99/70   Pulse: 84  (!) 101     Resp: (!) 35 (!) 46 16 (!) 27  Temp:   98.9 F (37.2 C)   TempSrc:   Oral   SpO2:   96%   Weight:      Height:        Intake/Output Summary (Last 24 hours) at 01/29/2018 0828 Last data filed at 01/29/2018 0115 Gross per 24 hour  Intake 1155.65 ml  Output 1000 ml  Net 155.65 ml   Filed Weights   01/21/18 1107  Weight: 92.1 kg (203 lb)    Examination:  General exam: tachypnea on 2 lit of [place] oxygen. Not in distress.  Respiratory system: diminished on the right side, air entry fair on the left.  Cardiovascular system: S1 & S2 heard,irregular, murmer present.  Gastrointestinal system: Abdomen is soft non tender non distended bowel sounds good.  Central nervous system: Alert and oriented, non focal.  Extremities: no cyanosis or clubbing. No pedal edema.  Skin: No rashes, lesions or ulcers Psychiatry: . Mood & affect appropriate.     Data Reviewed: I have personally reviewed following labs and imaging studies  CBC: Recent Labs  Lab 01/25/18 0224 01/26/18 0216 01/26/18 1846 01/27/18 0224 01/28/18 0310 01/29/18 0215  WBC 10.7* 22.9*  --  14.6* 16.9* 18.8*  NEUTROABS 8.4* 20.9*  --  11.9* 13.6* 15.3*  HGB 8.7* 9.1* 8.9* 8.9* 8.8* 10.9*  HCT 27.7* 29.0* 28.1* 27.9* 27.8* 34.1*  MCV 85.8 85.5  --  84.5 84.8 86.1  PLT 178 216  --  217 207 035   Basic Metabolic Panel: Recent Labs  Lab 01/24/18 0752 01/25/18 0224 01/26/18 0216 01/27/18 0224 01/28/18 0310  NA 135 137 137 137 138  K 4.3 4.1 4.4 4.2 4.2  CL 103 107 107 107 106  CO2 19* 23 23 25 25   GLUCOSE 86 81 149* 92 100*  BUN 18 19 21 22  25*  CREATININE 1.29* 1.35* 1.29* 1.29* 1.48*  CALCIUM 8.8* 8.4* 8.7* 8.5* 8.3*  MG  --   --   --  1.7  --    GFR: Estimated Creatinine Clearance: 45.9 mL/min (A) (by C-G formula based on SCr of 1.48 mg/dL (H)). Liver Function Tests: Recent Labs  Lab 01/24/18 0752 01/25/18 0224 01/26/18 0216 01/27/18 0224 01/28/18 0310  AST 22 17 19 21 21   ALT 14 12 15 15 15   ALKPHOS  73 66 67 65 71  BILITOT 0.8 0.6 0.7 0.5 0.5  PROT 5.5* 5.3* 5.5* 4.9* 5.0*  ALBUMIN 2.9* 2.6* 2.7* 2.4* 2.4*   No results for input(s): LIPASE, AMYLASE in the last 168 hours. No results for input(s): AMMONIA in the last 168 hours. Coagulation Profile: No results for input(s): INR, PROTIME in the last 168 hours. Cardiac Enzymes: Recent Labs  Lab 01/22/18 1001  TROPONINI <0.03   BNP (last 3 results) No results for input(s): PROBNP in the last 8760 hours. HbA1C: No results for input(s): HGBA1C in the last 72 hours. CBG: Recent Labs  Lab 01/26/18 1937 01/28/18 1702  GLUCAP 128* 128*   Lipid Profile: No results for input(s): CHOL, HDL, LDLCALC, TRIG, CHOLHDL, LDLDIRECT in the last 72 hours. Thyroid Function Tests: No results for input(s): TSH, T4TOTAL, FREET4,  T3FREE, THYROIDAB in the last 72 hours. Anemia Panel: No results for input(s): VITAMINB12, FOLATE, FERRITIN, TIBC, IRON, RETICCTPCT in the last 72 hours. Sepsis Labs: Recent Labs  Lab 01/26/18 0650 01/26/18 0935 01/28/18 0310  PROCALCITON <0.10  --  0.10  LATICACIDVEN 1.6 1.8  --     Recent Results (from the past 240 hour(s))  Culture, blood (Routine x 2)     Status: None   Collection Time: 01/21/18 11:40 AM  Result Value Ref Range Status   Specimen Description   Final    BLOOD RIGHT ARM Performed at [facility], [address] [place], [place] [address]    Special Requests   Final    BOTTLES DRAWN AEROBIC AND ANAEROBIC Blood Culture adequate volume Performed at [facility], [address], [place], [place] [address]    Culture   Final    NO GROWTH 5 DAYS Performed at [facility], [place] [address] [place], [place] [address]    Report Status 01/26/2018 FINAL  Final  Culture, blood (Routine x 2)     Status: None   Collection Time: 01/21/18 11:50 AM  Result Value Ref Range Status   Specimen Description   Final    BLOOD LEFT ARM Performed at [facility], [address] [place], [place] [address]    Special Requests   Final    BOTTLES DRAWN AEROBIC AND ANAEROBIC Blood Culture results may not be optimal due to an inadequate volume of blood received in culture bottles Performed at [facility], [address] [place], [place] [address]    Culture   Final    NO GROWTH 5 DAYS Performed at [facility], [place] [address] [place], [place] [address]    Report Status 01/26/2018 FINAL  Final  Urine culture     Status: Abnormal   Collection Time: 01/21/18  2:32 PM  Result Value Ref Range Status   Specimen Description   Final    URINE, CLEAN CATCH Performed at [facility], [address] [place], [place] [address]    Special Requests   Final    NONE Performed at [facility], [address] [place], [place] [address]    Culture 20,000 COLONIES/mL PSEUDOMONAS AERUGINOSA (A)  Final   Report Status 01/24/2018 FINAL  Final   Organism ID, Bacteria PSEUDOMONAS AERUGINOSA (A)  Final      Susceptibility   Pseudomonas aeruginosa - MIC*    CEFTAZIDIME 2 SENSITIVE Sensitive     CIPROFLOXACIN 1 SENSITIVE Sensitive     GENTAMICIN 4 SENSITIVE Sensitive     IMIPENEM 2 SENSITIVE Sensitive     PIP/TAZO <=4 SENSITIVE Sensitive     CEFEPIME 8 SENSITIVE Sensitive     * 20,000 COLONIES/mL PSEUDOMONAS AERUGINOSA  MRSA PCR Screening     Status: None   Collection Time: 01/21/18  8:47 PM  Result Value Ref Range Status   MRSA by PCR NEGATIVE NEGATIVE Final    Comment:        The GeneXpert MRSA Assay (FDA approved for NASAL specimens only), is one component of a comprehensive MRSA colonization surveillance program. It is not intended to diagnose MRSA infection nor to guide or monitor treatment for MRSA infections. Performed at [facility], [place] [address] [place],  [address]   Culture, body fluid-bottle     Status: None   Collection Time:  01/22/18  2:59 PM  Result Value Ref Range Status   Specimen  Description PLEURAL RIGHT  Final   Special Requests NONE  Final   Culture   Final    NO GROWTH 5 DAYS Performed at [facility], [address] [place], [place] [address]    Report Status 01/27/2018 FINAL  Final  Gram stain     Status: None   Collection Time: 01/22/18  2:59 PM  Result Value Ref Range Status   Specimen Description PLEURAL RIGHT  Final   Special Requests NONE  Final   Gram Stain   Final    FEW WBC PRESENT, PREDOMINANTLY MONONUCLEAR NO ORGANISMS SEEN Performed at [facility], [place] [address] [place], [place] [address]    Report Status 01/22/2018 FINAL  Final         Radiology Studies: Dg Chest Port 1 View  Result Date: 01/28/2018 CLINICAL DATA:  Short of breath EXAM: PORTABLE CHEST 1 VIEW COMPARISON:  01/26/2018 FINDINGS: Progressive right pleural effusion and right lower lobe airspace disease. Left lung remains clear. Heart size and vascularity normal. Bilateral shoulder replacement.  Dual lead pacemaker unchanged. IMPRESSION: Interval progression of right lower lobe effusion and right lower lobe airspace disease. Electronically Signed   By: Franchot Gallo M.D.   On: 01/28/2018 11:24        Scheduled Meds: . dronedarone  400 mg Oral BID WC  . feeding supplement (ENSURE ENLIVE)  237 mL Oral BID BM  . ferrous sulfate  325 mg Oral Q breakfast  . fluticasone  2 spray Each Nare QHS  . folic acid  2 mg Oral Daily  . lipase/protease/amylase  72,000 Units Oral BID AC  . mouth rinse  15 mL Mouth Rinse BID  . megestrol  400 mg Oral BID  . metoprolol succinate  25 mg Oral Daily  . pantoprazole  40 mg Oral Daily  . polyethylene glycol  17 g Oral Daily  . senna-docusate  2 tablet Oral BID  . tamsulosin  0.4 mg Oral QPC supper  . vitamin B-6  25 mg Oral BID   Continuous Infusions: . ceFEPime (MAXIPIME) IV 2 g (01/28/18 1520)     LOS: 7 days    Time spent: 35 minutes.     Hosie Poisson, MD [facility] Pager [phone]  If 7PM-7AM,  please contact night-coverage [email or web address] Password [facility] 01/29/2018, 8:28 AM

## 2018-01-30 LAB — CBC WITH DIFFERENTIAL/PLATELET
Abs Immature Granulocytes: 0.5 10*3/uL — ABNORMAL HIGH (ref 0.0–0.1)
BASOS ABS: 0 10*3/uL (ref 0.0–0.1)
Basophils Relative: 0 %
Eosinophils Absolute: 0.2 10*3/uL (ref 0.0–0.7)
Eosinophils Relative: 1 %
HEMATOCRIT: 32.6 % — AB (ref 39.0–52.0)
HEMOGLOBIN: 10.6 g/dL — AB (ref 13.0–17.0)
IMMATURE GRANULOCYTES: 3 %
LYMPHS ABS: 0.7 10*3/uL (ref 0.7–4.0)
LYMPHS PCT: 4 %
MCH: 27.7 pg (ref 26.0–34.0)
MCHC: 32.5 g/dL (ref 30.0–36.0)
MCV: 85.3 fL (ref 78.0–100.0)
Monocytes Absolute: 1.6 10*3/uL — ABNORMAL HIGH (ref 0.1–1.0)
Monocytes Relative: 9 %
NEUTROS ABS: 13.8 10*3/uL — AB (ref 1.7–7.7)
NEUTROS PCT: 83 %
Platelets: 216 10*3/uL (ref 150–400)
RBC: 3.82 MIL/uL — AB (ref 4.22–5.81)
RDW: 18.2 % — ABNORMAL HIGH (ref 11.5–15.5)
WBC: 16.8 10*3/uL — AB (ref 4.0–10.5)

## 2018-01-30 LAB — COMPREHENSIVE METABOLIC PANEL
ALBUMIN: 2.1 g/dL — AB (ref 3.5–5.0)
ALK PHOS: 69 U/L (ref 38–126)
ALT: 14 U/L (ref 0–44)
ANION GAP: 9 (ref 5–15)
AST: 17 U/L (ref 15–41)
BILIRUBIN TOTAL: 0.8 mg/dL (ref 0.3–1.2)
BUN: 29 mg/dL — AB (ref 8–23)
CALCIUM: 8 mg/dL — AB (ref 8.9–10.3)
CO2: 25 mmol/L (ref 22–32)
CREATININE: 1.6 mg/dL — AB (ref 0.61–1.24)
Chloride: 103 mmol/L (ref 98–111)
GFR calc Af Amer: 46 mL/min — ABNORMAL LOW (ref >60)
GFR calc non Af Amer: 40 mL/min — ABNORMAL LOW (ref >60)
GLUCOSE: 95 mg/dL (ref 70–99)
POTASSIUM: 3.8 mmol/L (ref 3.5–5.1)
Sodium: 137 mmol/L (ref 135–145)
Total Protein: 4.9 g/dL — ABNORMAL LOW (ref 6.5–8.1)

## 2018-01-30 MED ORDER — LEVALBUTEROL HCL 1.25 MG/0.5ML IN NEBU
1.2500 mg | INHALATION_SOLUTION | Freq: Two times a day (BID) | RESPIRATORY_TRACT | Status: DC
  Administered 2018-01-31 (×2): 1.25 mg via RESPIRATORY_TRACT
  Filled 2018-01-30 (×3): qty 0.5

## 2018-01-30 MED ORDER — LEVALBUTEROL HCL 1.25 MG/0.5ML IN NEBU
1.2500 mg | INHALATION_SOLUTION | Freq: Three times a day (TID) | RESPIRATORY_TRACT | Status: DC
  Administered 2018-01-30 (×2): 1.25 mg via RESPIRATORY_TRACT
  Filled 2018-01-30 (×2): qty 0.5

## 2018-01-30 NOTE — Progress Notes (Signed)
Barry Curry had 800 cc of fluid removed yesterday.  He said that earlier this morning, his breathing felt a whole lot better.  Now, he feels a little bit tight in the chest.  He has some discomfort overall on the right chest wall.  He has a lower heart rate right now.  His monitor shows that his heart rate is is in the 80s.  He might benefit from some nebulizers.  He is getting them as needed.  I think it would help to do the nebulizers on schedule.  His appetite might be improving a little bit.  We still need to see when the VATS procedure is going to happen.  Cardiothoracic surgery has not yet seen him this weekend.  His labs shows white cell count to be 16.8.  Hemoglobin 10.6.  Platelet count 216,000.  His creatinine is 1.6.  Potassium 3.8.  Albumin is 2.1.  On his exam, he does have some slight wheezing.  May be, an incentive spirometer might help him.  I will order one.  We now just have to wait the decision from cardiothoracic surgery regarding the VATS procedure.  I still feel that we are looking at a malignancy.  I do not know if this is his renal cell that has come back quickly or if this is another form of cancer.  I very much appreciate the great care and compassion that he has been shown by all the staff up on [address].  Lattie Haw, MD  Numbers 6:24-26

## 2018-01-30 NOTE — Progress Notes (Signed)
PROGRESS NOTE    Barry Curry  [name]:[id number] DOB: 06/02/1941 DOA: 01/21/2018 PCP: [name], PA-C    Brief Narrative:  Barry Curry a 77 y.o.malewith medical history significant ofhypertension, hyperlipidemia, GERD, depression, anxiety, atrial fibrillation not on anticoagulant, CAD, PVC, aortic stenosis, RBBB, iron deficiency anemia, CKD, clear renal cell cancer (left nephrectomy), melanoma, OSA who presents with shortness of breath.  3 weeks prior had a right thoracentesis at [facility] with 670 cc of bloody fluid removed.  At this admission on 01/22/2018 patient had a ultrasound-guided right thoracentesis with 2 L of bloody fluid removed.  Pleural fluid sent for Gram stain, culture, cytology.  Pulmonology consulted to assess for recurrence of pleural effusion.  Exudative pleural fluid.  Gram stain negative.  Cytology still pending. He underwent repeat thoracentesis on 7/31 for recurrent pleural effusion,  with cytology showing reactive mesothelial cells.     Assessment & Plan:   Principal Problem:   Recurrent right pleural effusion Active Problems:   Essential hypertension   GERD   Acute respiratory failure with hypoxia (HCC)   PAF (paroxysmal atrial fibrillation) (HCC)   Clear cell carcinoma of kidney, left (HCC)   Anxiety   CAD (coronary artery disease)   Iron deficiency anemia   Elevated troponin   CKD (chronic kidney disease), stage III (HCC)   Malnutrition of moderate degree   Acute respiratory failure with hypoxia secondary to recurrent pleural effusion, possibly malignant effusion  - IR guided thoracentesis 4 times in the last  30 days.  - exudative, gram stain negative, culture negative and cytology showing reactive mesothelial cells  - [place] oxygen to keep sats greater than 90%. Currently on 2 lit of [place] oxygen.    He is afebrile and has persistent leukocytosis.  Pro calcitonin level is negative, indicative of non infective process but in view  of the consolidative process and persistent leukocytosis and reactive mesothelial cell on cytology, and after discussing with Dr Tommy [name] with ID, will plan for 5 days of IV cefepime for possible HCAP.  Requested cardio thoracic surgery for  VATS with pleural biopsy, tentatively scheduled for Tuesday. .    Paroxysmal atrial fibrillation:  Rate controlled. On metoprolol and dronedarone.   Iron deficiency anemia/ acute blood loss anemia:  IV iron and aranesp ordered by oncology.  He underwent 2 units of prbc transfusion and his repeat hemoglobin is at 10.9.  No changes in medications.   Hypertension:  Well controlled.   CAD: no chest pain or sob. Resume BB.   GERD stable.   Mild  Acute on Stage 3 CKD.  Creatinine at 1.6 today. No changes in meds.   Leukocytosis: suspect reactive. Pro calcitonin is negative.  Afebrile, no dysuria symptoms. Urine culture report grew 20,000 pseudomonas.  Blood cultures have been negative so far.  Pleural fluid culture negative.  Cytology from pleural fluid analysis shows reactive mesothelial cells.     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  Dr Marin Olp on board.  Follow up with PET/CT scan for biopsy of the adrenal nodules and pulmonary nodules.    Moderate Protein Calorie malnutrition:  - nutrition consulted.     DVT prophylaxis: SCD'S Code Status: full code.  Family Communication: none at bedside.  Disposition Plan: pending clinical improvement and VATS next week.     Consultants:   Oncology  Tcts.    Procedures: IR thoracentesis on 7/31, 8/3  Antimicrobials: cefepime since 8/2  Subjective: On 2 lit of [place] oxygen.  No chest pain , breathing improved after thoracentesis yesterday.   Objective: Vitals:   01/30/18 0752 01/30/18 1140 01/30/18 1200 01/30/18 1413  BP: 95/60  95/60   Pulse:  88    Resp:  (!) 25    Temp:  97.9 F (36.6 C)    TempSrc:  Oral    SpO2:  99%  94%    Weight:      Height:        Intake/Output Summary (Last 24 hours) at 01/30/2018 1457 Last data filed at 01/30/2018 1000 Gross per 24 hour  Intake 360 ml  Output -  Net 360 ml   Filed Weights   01/21/18 1107  Weight: 92.1 kg (203 lb)    Examination:  General exam: tachypnea on 2 lit of [place] oxygen. Not in distress.  Respiratory system: air entry fair, still diminished on the right. No wheezing heard.  Cardiovascular system: S1 & S2 heard,irregular, murmer present.  Gastrointestinal system: Abdomen is soft NT ND BS+ Central nervous system: Alert and oriented, non focal.  Extremities: no cyanosis or clubbing. No pedal edema.  Skin: No rashes, lesions or ulcers Psychiatry: . Mood & affect appropriate.     Data Reviewed: I have personally reviewed following labs and imaging studies  CBC: Recent Labs  Lab 01/26/18 0216 01/26/18 1846 01/27/18 0224 01/28/18 0310 01/29/18 0215 01/30/18 0203  WBC 22.9*  --  14.6* 16.9* 18.8* 16.8*  NEUTROABS 20.9*  --  11.9* 13.6* 15.3* 13.8*  HGB 9.1* 8.9* 8.9* 8.8* 10.9* 10.6*  HCT 29.0* 28.1* 27.9* 27.8* 34.1* 32.6*  MCV 85.5  --  84.5 84.8 86.1 85.3  PLT 216  --  217 207 205 469   Basic Metabolic Panel: Recent Labs  Lab 01/25/18 0224 01/26/18 0216 01/27/18 0224 01/28/18 0310 01/30/18 0203  NA 137 137 137 138 137  K 4.1 4.4 4.2 4.2 3.8  CL 107 107 107 106 103  CO2 23 23 25 25 25   GLUCOSE 81 149* 92 100* 95  BUN 19 21 22  25* 29*  CREATININE 1.35* 1.29* 1.29* 1.48* 1.60*  CALCIUM 8.4* 8.7* 8.5* 8.3* 8.0*  MG  --   --  1.7  --   --    GFR: Estimated Creatinine Clearance: 42.4 mL/min (A) (by C-G formula based on SCr of 1.6 mg/dL (H)). Liver Function Tests: Recent Labs  Lab 01/25/18 0224 01/26/18 0216 01/27/18 0224 01/28/18 0310 01/30/18 0203  AST 17 19 21 21 17   ALT 12 15 15 15 14   ALKPHOS 66 67 65 71 69  BILITOT 0.6 0.7 0.5 0.5 0.8  PROT 5.3* 5.5* 4.9* 5.0* 4.9*  ALBUMIN 2.6* 2.7* 2.4* 2.4* 2.1*   No results for  input(s): LIPASE, AMYLASE in the last 168 hours. No results for input(s): AMMONIA in the last 168 hours. Coagulation Profile: No results for input(s): INR, PROTIME in the last 168 hours. Cardiac Enzymes: No results for input(s): CKTOTAL, CKMB, CKMBINDEX, TROPONINI in the last 168 hours. BNP (last 3 results) No results for input(s): PROBNP in the last 8760 hours. HbA1C: No results for input(s): HGBA1C in the last 72 hours. CBG: Recent Labs  Lab 01/26/18 1937 01/28/18 1702  GLUCAP 128* 128*   Lipid Profile: No results for input(s): CHOL, HDL, LDLCALC, TRIG, CHOLHDL, LDLDIRECT in the last 72 hours. Thyroid Function Tests: No results for input(s): TSH, T4TOTAL, FREET4, T3FREE, THYROIDAB in the last 72 hours. Anemia Panel: No results for input(s): VITAMINB12,  FOLATE, FERRITIN, TIBC, IRON, RETICCTPCT in the last 72 hours. Sepsis Labs: Recent Labs  Lab 01/26/18 0650 01/26/18 0935 01/28/18 0310  PROCALCITON <0.10  --  0.10  LATICACIDVEN 1.6 1.8  --     Recent Results (from the past 240 hour(s))  Culture, blood (Routine x 2)     Status: None   Collection Time: 01/21/18 11:40 AM  Result Value Ref Range Status   Specimen Description   Final    BLOOD RIGHT ARM Performed at [facility], [address] [place], [place] [address]    Special Requests   Final    BOTTLES DRAWN AEROBIC AND ANAEROBIC Blood Culture adequate volume Performed at [facility], [address] [place], [place] [address]    Culture   Final    NO GROWTH 5 DAYS Performed at [facility], [place] [address], [place], [place] [address]    Report Status 01/26/2018 FINAL  Final  Culture, blood (Routine x 2)     Status: None   Collection Time: 01/21/18 11:50 AM  Result Value Ref Range Status   Specimen Description   Final    BLOOD LEFT ARM Performed at [facility], [address] [place], [place] [address]    Special Requests   Final    BOTTLES DRAWN AEROBIC AND  ANAEROBIC Blood Culture results may not be optimal due to an inadequate volume of blood received in culture bottles Performed at [facility], [address] [place], [place] [address]    Culture   Final    NO GROWTH 5 DAYS Performed at [facility], [place] [address] [place], [place] [address]    Report Status 01/26/2018 FINAL  Final  Urine culture     Status: Abnormal   Collection Time: 01/21/18  2:32 PM  Result Value Ref Range Status   Specimen Description   Final    URINE, CLEAN CATCH Performed at [facility], [address] [place], [place] [address]    Special Requests   Final    NONE Performed at [facility], [address] [place], [place] [address]    Culture 20,000 COLONIES/mL PSEUDOMONAS AERUGINOSA (A)  Final   Report Status 01/24/2018 FINAL  Final   Organism ID, Bacteria PSEUDOMONAS AERUGINOSA (A)  Final      Susceptibility   Pseudomonas aeruginosa - MIC*    CEFTAZIDIME 2 SENSITIVE Sensitive     CIPROFLOXACIN 1 SENSITIVE Sensitive     GENTAMICIN 4 SENSITIVE Sensitive     IMIPENEM 2 SENSITIVE Sensitive     PIP/TAZO <=4 SENSITIVE Sensitive     CEFEPIME 8 SENSITIVE Sensitive     * 20,000 COLONIES/mL PSEUDOMONAS AERUGINOSA  MRSA PCR Screening     Status: None   Collection Time: 01/21/18  8:47 PM  Result Value Ref Range Status   MRSA by PCR NEGATIVE NEGATIVE Final    Comment:        The GeneXpert MRSA Assay (FDA approved for NASAL specimens only), is one component of a comprehensive MRSA colonization surveillance program. It is not intended to diagnose MRSA infection nor to guide or monitor treatment for MRSA infections. Performed at [facility], [place] [address], [place], [place] [address]   Culture, body fluid-bottle     Status: None   Collection Time: 01/22/18  2:59 PM  Result Value Ref Range Status   Specimen Description  PLEURAL RIGHT  Final   Special Requests NONE  Final   Culture   Final    NO GROWTH 5  DAYS Performed at [facility], [place] [address] [place], [place] [address]    Report Status 01/27/2018 FINAL  Final  Gram stain     Status: None   Collection Time: 01/22/18  2:59 PM  Result Value Ref Range Status   Specimen Description PLEURAL RIGHT  Final   Special Requests NONE  Final   Gram Stain   Final    FEW WBC PRESENT, PREDOMINANTLY MONONUCLEAR NO ORGANISMS SEEN Performed at [facility], [place] [address] [place], [place] [address]    Report Status 01/22/2018 FINAL  Final         Radiology Studies: Dg Chest 1 View  Result Date: 01/29/2018 CLINICAL DATA:  Status post right thoracentesis EXAM: CHEST  1 VIEW COMPARISON:  01/28/2018 FINDINGS: Right-sided pleural effusion has reduced significantly. Some residual pleural thickening is noted laterally. No pneumothorax is seen. Cardiac shadow is stable. Left lung remains clear. IMPRESSION: Reduction in right-sided pleural effusion following thoracentesis. No pneumothorax is noted. Electronically Signed   By: Inez Catalina M.D.   On: 01/29/2018 11:11   US Thoracentesis Asp Pleural Space W/img Guide  Result Date: 01/29/2018 INDICATION: Patient with history of shortness of breath and recurrence right pleural effusion. Request is made for diagnostic and therapeutic right thoracentesis. EXAM: ULTRASOUND GUIDED DIAGNOSTIC AND THERAPEUTIC RIGHT THORACENTESIS MEDICATIONS: 10 mL of 2% lidocaine COMPLICATIONS: None immediate. PROCEDURE: An ultrasound guided thoracentesis was thoroughly discussed with the patient and questions answered. The benefits, risks, alternatives and complications were also discussed. The patient understands and wishes to proceed with the procedure. Written consent was obtained. Ultrasound was performed to localize and mark an adequate pocket of fluid in the right chest. The area was then prepped and draped in the normal sterile fashion. 2% Lidocaine was used for local anesthesia. Under ultrasound guidance a 6 Fr  Safe-T-Centesis catheter was introduced. Thoracentesis was performed. The catheter was removed and a dressing applied. FINDINGS: A total of approximately 800 mL of blood-tinged fluid was removed. Samples were sent to the laboratory as requested by the clinical team. IMPRESSION: Successful ultrasound guided right thoracentesis yielding 800 mL of pleural fluid. Read by: Earley Abide, PA-C Electronically Signed   By: Jacqulynn Cadet M.D.   On: 01/29/2018 11:34        Scheduled Meds: . dronedarone  400 mg Oral BID WC  . feeding supplement (ENSURE ENLIVE)  237 mL Oral BID BM  . ferrous sulfate  325 mg Oral Q breakfast  . fluticasone  2 spray Each Nare QHS  . folic acid  2 mg Oral Daily  . levalbuterol  1.25 mg Nebulization Q8H  . lipase/protease/amylase  72,000 Units Oral BID AC  . mouth rinse  15 mL Mouth Rinse BID  . megestrol  400 mg Oral BID  . metoprolol succinate  25 mg Oral Daily  . pantoprazole  40 mg Oral Daily  . polyethylene glycol  17 g Oral Daily  . senna-docusate  2 tablet Oral BID  . tamsulosin  0.4 mg Oral QPC supper  . vitamin B-6  25 mg Oral BID   Continuous Infusions: . ceFEPime (MAXIPIME) IV Stopped (01/29/18 1900)     LOS: 8 days    Time spent: 35 minutes.     Hosie Poisson, MD [facility] Pager [phone]  If 7PM-7AM, please contact night-coverage [email or web address] Password [email or web address]  01/30/2018, 2:57 PM

## 2018-01-30 NOTE — Progress Notes (Signed)
  Subjective:  Says his breathing is better since thoracentesis yesterday. Some pains in right chest  Objective: Vital signs in last 24 hours: Temp:  [97.6 F (36.4 C)-98.7 F (37.1 C)] 97.9 F (36.6 C) (08/04 1140) Pulse Rate:  [53-99] 88 (08/04 1140) Cardiac Rhythm: Normal sinus rhythm;Atrial paced;Bundle branch block (08/04 0753) Resp:  [22-36] 25 (08/04 1140) BP: (87-95)/(57-68) 95/60 (08/04 1200) SpO2:  [92 %-99 %] 94 % (08/04 1413)  Hemodynamic parameters for last 24 hours:    Intake/Output from previous day: 08/03 0701 - 08/04 0700 In: 600 [P.O.:600] Out: 425 [Urine:425] Intake/Output this shift: Total I/O In: 120 [P.O.:120] Out: -   General appearance: alert and cooperative Heart: regular rate and rhythm, S1, S2 normal, no murmur, click, rub or gallop Lungs: clear to auscultation bilaterally  Lab Results: Recent Labs    01/29/18 0215 01/30/18 0203  WBC 18.8* 16.8*  HGB 10.9* 10.6*  HCT 34.1* 32.6*  PLT 205 216   BMET:  Recent Labs    01/28/18 0310 01/30/18 0203  NA 138 137  K 4.2 3.8  CL 106 103  CO2 25 25  GLUCOSE 100* 95  BUN 25* 29*  CREATININE 1.48* 1.60*  CALCIUM 8.3* 8.0*    PT/INR: No results for input(s): LABPROT, INR in the last 72 hours. ABG No results found for: PHART, HCO3, TCO2, ACIDBASEDEF, O2SAT CBG (last 3)  Recent Labs    01/28/18 1702  GLUCAP 128*    Assessment/Plan:  Recurrent right pleural effusion s/p thoracentesis x 4. Had 800 cc removed yesterday and cytology pending. All previous cytologies have been negative for malignancy. Chest CT shows several pleural based masses. If cytology from yesterday does not show anything then I think proceeding with right VATS for biopsy of pleural based masses and drainage of any residual effusion and talc pleurodesis is indicated. I discussed all of this with patient and his daughter and they agree. I would tentatively plan for VATS on Tuesday.  LOS: 8 days    Gaye Pollack 01/30/2018

## 2018-01-31 LAB — CBC WITH DIFFERENTIAL/PLATELET
Abs Immature Granulocytes: 0.3 10*3/uL — ABNORMAL HIGH (ref 0.0–0.1)
Basophils Absolute: 0 10*3/uL (ref 0.0–0.1)
Basophils Relative: 0 %
EOS ABS: 0.1 10*3/uL (ref 0.0–0.7)
Eosinophils Relative: 1 %
HEMATOCRIT: 34 % — AB (ref 39.0–52.0)
Hemoglobin: 10.8 g/dL — ABNORMAL LOW (ref 13.0–17.0)
IMMATURE GRANULOCYTES: 2 %
Lymphocytes Relative: 5 %
Lymphs Abs: 0.6 10*3/uL — ABNORMAL LOW (ref 0.7–4.0)
MCH: 27.1 pg (ref 26.0–34.0)
MCHC: 31.8 g/dL (ref 30.0–36.0)
MCV: 85.2 fL (ref 78.0–100.0)
MONOS PCT: 9 %
Monocytes Absolute: 1.3 10*3/uL — ABNORMAL HIGH (ref 0.1–1.0)
NEUTROS ABS: 11.7 10*3/uL — AB (ref 1.7–7.7)
NEUTROS PCT: 83 %
Platelets: 232 10*3/uL (ref 150–400)
RBC: 3.99 MIL/uL — ABNORMAL LOW (ref 4.22–5.81)
RDW: 18.6 % — AB (ref 11.5–15.5)
WBC: 14 10*3/uL — AB (ref 4.0–10.5)

## 2018-01-31 LAB — COMPREHENSIVE METABOLIC PANEL
ALT: 16 U/L (ref 0–44)
AST: 21 U/L (ref 15–41)
Albumin: 2.1 g/dL — ABNORMAL LOW (ref 3.5–5.0)
Alkaline Phosphatase: 70 U/L (ref 38–126)
Anion gap: 7 (ref 5–15)
BILIRUBIN TOTAL: 0.8 mg/dL (ref 0.3–1.2)
BUN: 24 mg/dL — AB (ref 8–23)
CO2: 24 mmol/L (ref 22–32)
CREATININE: 1.47 mg/dL — AB (ref 0.61–1.24)
Calcium: 8.1 mg/dL — ABNORMAL LOW (ref 8.9–10.3)
Chloride: 107 mmol/L (ref 98–111)
GFR calc Af Amer: 51 mL/min — ABNORMAL LOW (ref >60)
GFR, EST NON AFRICAN AMERICAN: 44 mL/min — AB (ref >60)
Glucose, Bld: 91 mg/dL (ref 70–99)
POTASSIUM: 4 mmol/L (ref 3.5–5.1)
Sodium: 138 mmol/L (ref 135–145)
TOTAL PROTEIN: 5 g/dL — AB (ref 6.5–8.1)

## 2018-01-31 LAB — TYPE AND SCREEN
ABO/RH(D): A POS
Antibody Screen: NEGATIVE

## 2018-01-31 LAB — GLUCOSE, CAPILLARY: Glucose-Capillary: 106 mg/dL — ABNORMAL HIGH (ref 70–99)

## 2018-01-31 MED ORDER — TALC (STERITALC) POWDER FOR INTRAPLEURAL USE: 4.0000 g | Freq: Once | INTRAVENOUS | Status: DC

## 2018-01-31 MED ORDER — FENTANYL CITRATE (PF) 100 MCG/2ML IJ SOLN: 25.0000 ug | INTRAMUSCULAR | Status: DC | PRN

## 2018-01-31 MED ORDER — OXYCODONE HCL 5 MG/5ML PO SOLN: 5.0000 mg | Freq: Once | ORAL | Status: DC | PRN

## 2018-01-31 MED ORDER — LEVOFLOXACIN IN D5W 750 MG/150ML IV SOLN: 750.0000 mg | INTRAVENOUS | Status: DC

## 2018-01-31 MED ORDER — ONDANSETRON HCL 4 MG/2ML IJ SOLN: 4.0000 mg | Freq: Once | INTRAMUSCULAR | Status: DC | PRN

## 2018-01-31 MED ORDER — OXYCODONE HCL 5 MG PO TABS: 5.0000 mg | ORAL_TABLET | Freq: Once | ORAL | Status: DC | PRN

## 2018-01-31 NOTE — Plan of Care (Signed)
Discussed plan of care with patient.  Emphasized pain management and the importance of using the call button when assistance is needed.  Some teach back displayed.

## 2018-01-31 NOTE — Progress Notes (Signed)
Pharmacy Antibiotic Note  Barry Curry is a 77 y.o. male admitted on 01/21/2018 with respiratory failure 2/2 pleural effusion. Now with new RLL consolidation on CXR and persistent leukocytosis, concern for possible HCAP. Pharmacy has been consulted for cefepime dosing. WBC 14, currently AF. Scr 1.47 (BL ~1.3-1.5), estimated CrCl ~45 mL/min.  Plan for VATS per CVTS tomorrow. Five days of cefepime recommend per ID per Dr. Karleen Hampshire. Renal function stable. Continue current dose. D4 of cefepime now.   Plan: Cefepime 2g IV q24h F/u ID work-up, clinical status, C&S, de-escalation, LOT  Height: 6' (182.9 cm) Weight: 203 lb (92.1 kg) IBW/kg (Calculated) : 77.6  Temp (24hrs), Avg:98.3 F (36.8 C), Min:97.5 F (36.4 C), Max:99.1 F (37.3 C)  Recent Labs  Lab 01/26/18 0216 01/26/18 0650 01/26/18 0935 01/27/18 0224 01/28/18 0310 01/29/18 0215 01/30/18 0203 01/31/18 0255  WBC 22.9*  --   --  14.6* 16.9* 18.8* 16.8* 14.0*  CREATININE 1.29*  --   --  1.29* 1.48*  --  1.60* 1.47*  LATICACIDVEN  --  1.6 1.8  --   --   --   --   --     Estimated Creatinine Clearance: 46.2 mL/min (A) (by C-G formula based on SCr of 1.47 mg/dL (H)).    Allergies  Allergen Reactions  . Penicillins Rash    Has patient had a PCN reaction causing immediate rash, facial/tongue/throat swelling, SOB or lightheadedness with hypotension: No Has patient had a PCN reaction causing severe rash involving mucus membranes or skin necrosis: No Has patient had a PCN reaction that required hospitalization: No Has patient had a PCN reaction occurring within the last 10 years: No If all of the above answers are "NO", then may proceed with Cephalosporin use.    Onnie Boer, PharmD, BCIDP, AAHIVP, CPP Infectious Disease Pharmacist Pager: [phone] 01/31/2018 9:00 AM

## 2018-01-31 NOTE — Progress Notes (Signed)
PROGRESS NOTE    Barry Curry  HDQ:[id number] DOB: Jun 26, 1941 DOA: 01/21/2018 PCP: [name], PA-C    Brief Narrative:  Barry Curry a 77 y.o.malewith medical history significant ofhypertension, hyperlipidemia, GERD, depression, anxiety, atrial fibrillation not on anticoagulant, CAD, PVC, aortic stenosis, RBBB, iron deficiency anemia, CKD, clear renal cell cancer (left nephrectomy), melanoma, OSA who presents with shortness of breath.  3 weeks prior had a right thoracentesis at [facility] with 670 cc of bloody fluid removed.  At this admission on 01/22/2018 patient had a ultrasound-guided right thoracentesis with 2 L of bloody fluid removed.  Pleural fluid sent for Gram stain, culture, cytology.  Pulmonology consulted to assess for recurrence of pleural effusion.  Exudative pleural fluid.  Gram stain negative.  Cytology still pending. He underwent repeat thoracentesis on 7/31 for recurrent pleural effusion,  with cytology showing reactive mesothelial cells.     Assessment & Plan:   Principal Problem:   Recurrent right pleural effusion Active Problems:   Essential hypertension   GERD   Acute respiratory failure with hypoxia (HCC)   PAF (paroxysmal atrial fibrillation) (HCC)   Clear cell carcinoma of kidney, left (HCC)   Anxiety   CAD (coronary artery disease)   Iron deficiency anemia   Elevated troponin   CKD (chronic kidney disease), stage III (HCC)   Malnutrition of moderate degree   Acute respiratory failure with hypoxia secondary to recurrent pleural effusion, possibly malignant effusion  - IR guided thoracentesis 4 times in the last  30 days.  - exudative, gram stain negative, culture negative and cytology showing reactive mesothelial cells , not sure if the pleural fluid from thoracentesis on 8/3 sent for cytology.  - [place] oxygen to keep sats greater than 90%. Currently on RA,   He is afebrile and has persistent leukocytosis.  Pro calcitonin level  is negative, indicative of non infective process but in view of the consolidative process and persistent leukocytosis and reactive mesothelial cell on cytology, and after discussing with Dr Tommy [name] with ID, will plan for 5 days of IV cefepime for possible HCAP.  Requested cardio thoracic surgery for  VATS with pleural biopsy, tentatively scheduled for Tuesday.  No new complaints from the patient.    Paroxysmal atrial fibrillation:  Rate controlled. On metoprolol and dronedarone.   Iron deficiency anemia/ acute blood loss anemia:  IV iron and aranesp ordered by oncology.  He underwent 2 units of prbc transfusion and his repeat hemoglobin is at 10.9.  No changes in medications. Hemoglobin continues to be stable around 10.   Hypertension:  Well controlled.   CAD: no chest pain or sob. Resume BB.   GERD stable.   Mild  Acute on Stage 3 CKD.  Creatinine at 1.4. Stable.   Leukocytosis: suspect reactive. Pro calcitonin is negative.  Afebrile, no dysuria symptoms. Urine culture report grew 20,000 pseudomonas.  Blood cultures have been negative so far.  Pleural fluid culture negative.  Cytology from pleural fluid analysis shows reactive mesothelial cells.     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  Dr Marin Olp on board.  Follow up with PET/CT scan for biopsy of the adrenal nodules and pulmonary nodules.    Moderate Protein Calorie malnutrition:  - nutrition consulted.   Constipation:  Start the patient on miralax and dulcolax.   DVT prophylaxis: SCD'S Code Status: full code.  Family Communication: none at bedside.  Disposition Plan: pending clinical improvement and VATS  tomorrow.    Consultants:   Oncology  Tcts.    Procedures: IR thoracentesis on 7/31, 8/3  Antimicrobials: cefepime since 8/2  Subjective:  no chest pain or sob. Reports constipation.   Objective: Vitals:   01/31/18 0714 01/31/18 0825 01/31/18 0929  01/31/18 1134  BP:   (!) 90/58   Pulse:  99  88  Resp:  (!) 30  (!) 36  Temp:  97.9 F (36.6 C)  98.7 F (37.1 C)  TempSrc:  Oral  Tympanic  SpO2: 95%   96%  Weight:      Height:       No intake or output data in the 24 hours ending 01/31/18 1459 Filed Weights   01/21/18 1107  Weight: 92.1 kg (203 lb)    Examination:  General exam: alert and comfortable on RA.  Respiratory system: clear to auscultation, though some diminished air entry on the right.  Cardiovascular system: S1 & S2 heard,irregular, murmer present.  Gastrointestinal system: Abdomen is soft non tender non distended bowel sounds good.  Central nervous system: Alert and oriented, non focal.  Extremities: no cyanosis or clubbing. No pedal edema.  Skin: No rashes, lesions or ulcers Psychiatry: . Mood & affect appropriate.     Data Reviewed: I have personally reviewed following labs and imaging studies  CBC: Recent Labs  Lab 01/27/18 0224 01/28/18 0310 01/29/18 0215 01/30/18 0203 01/31/18 0255  WBC 14.6* 16.9* 18.8* 16.8* 14.0*  NEUTROABS 11.9* 13.6* 15.3* 13.8* 11.7*  HGB 8.9* 8.8* 10.9* 10.6* 10.8*  HCT 27.9* 27.8* 34.1* 32.6* 34.0*  MCV 84.5 84.8 86.1 85.3 85.2  PLT 217 207 205 216 147   Basic Metabolic Panel: Recent Labs  Lab 01/26/18 0216 01/27/18 0224 01/28/18 0310 01/30/18 0203 01/31/18 0255  NA 137 137 138 137 138  K 4.4 4.2 4.2 3.8 4.0  CL 107 107 106 103 107  CO2 23 25 25 25 24   GLUCOSE 149* 92 100* 95 91  BUN 21 22 25* 29* 24*  CREATININE 1.29* 1.29* 1.48* 1.60* 1.47*  CALCIUM 8.7* 8.5* 8.3* 8.0* 8.1*  MG  --  1.7  --   --   --    GFR: Estimated Creatinine Clearance: 46.2 mL/min (A) (by C-G formula based on SCr of 1.47 mg/dL (H)). Liver Function Tests: Recent Labs  Lab 01/26/18 0216 01/27/18 0224 01/28/18 0310 01/30/18 0203 01/31/18 0255  AST 19 21 21 17 21   ALT 15 15 15 14 16   ALKPHOS 67 65 71 69 70  BILITOT 0.7 0.5 0.5 0.8 0.8  PROT 5.5* 4.9* 5.0* 4.9* 5.0*    ALBUMIN 2.7* 2.4* 2.4* 2.1* 2.1*   No results for input(s): LIPASE, AMYLASE in the last 168 hours. No results for input(s): AMMONIA in the last 168 hours. Coagulation Profile: No results for input(s): INR, PROTIME in the last 168 hours. Cardiac Enzymes: No results for input(s): CKTOTAL, CKMB, CKMBINDEX, TROPONINI in the last 168 hours. BNP (last 3 results) No results for input(s): PROBNP in the last 8760 hours. HbA1C: No results for input(s): HGBA1C in the last 72 hours. CBG: Recent Labs  Lab 01/26/18 1937 01/28/18 1702  GLUCAP 128* 128*   Lipid Profile: No results for input(s): CHOL, HDL, LDLCALC, TRIG, CHOLHDL, LDLDIRECT in the last 72 hours. Thyroid Function Tests: No results for input(s): TSH, T4TOTAL, FREET4, T3FREE, THYROIDAB in the last 72 hours. Anemia Panel: No results for input(s): VITAMINB12, FOLATE, FERRITIN, TIBC, IRON, RETICCTPCT in the last 72 hours. Sepsis Labs: Recent Labs  Lab 01/26/18 0650 01/26/18 0935 01/28/18 0310  PROCALCITON <0.10  --  0.10  LATICACIDVEN 1.6 1.8  --     Recent Results (from the past 240 hour(s))  MRSA PCR Screening     Status: None   Collection Time: 01/21/18  8:47 PM  Result Value Ref Range Status   MRSA by PCR NEGATIVE NEGATIVE Final    Comment:        The GeneXpert MRSA Assay (FDA approved for NASAL specimens only), is one component of a comprehensive MRSA colonization surveillance program. It is not intended to diagnose MRSA infection nor to guide or monitor treatment for MRSA infections. Performed at [facility], [place] [address] [place], [place] [address]   Culture, body fluid-bottle     Status: None   Collection Time: 01/22/18  2:59 PM  Result Value Ref Range Status   Specimen Description PLEURAL RIGHT  Final   Special Requests NONE  Final   Culture   Final    NO GROWTH 5 DAYS Performed at [place] [address] [place], [place] [address]    Report Status 01/27/2018 FINAL  Final  Gram  stain     Status: None   Collection Time: 01/22/18  2:59 PM  Result Value Ref Range Status   Specimen Description PLEURAL RIGHT  Final   Special Requests NONE  Final   Gram Stain   Final    FEW WBC PRESENT, PREDOMINANTLY MONONUCLEAR NO ORGANISMS SEEN Performed at [facility], [place] [address], [place], [place] [address]    Report Status 01/22/2018 FINAL  Final         Radiology Studies: No results found.      Scheduled Meds: . dronedarone  400 mg Oral BID WC  . feeding supplement (ENSURE ENLIVE)  237 mL Oral BID BM  . ferrous sulfate  325 mg Oral Q breakfast  . fluticasone  2 spray Each Nare QHS  . folic acid  2 mg Oral Daily  . levalbuterol  1.25 mg Nebulization BID  . lipase/protease/amylase  72,000 Units Oral BID AC  . mouth rinse  15 mL Mouth Rinse BID  . megestrol  400 mg Oral BID  . metoprolol succinate  25 mg Oral Daily  . pantoprazole  40 mg Oral Daily  . polyethylene glycol  17 g Oral Daily  . senna-docusate  2 tablet Oral BID  . tamsulosin  0.4 mg Oral QPC supper  . vitamin B-6  25 mg Oral BID   Continuous Infusions: . ceFEPime (MAXIPIME) IV 2 g (01/31/18 1353)     LOS: 9 days    Time spent: 35 minutes.     Hosie Poisson, MD [facility] Pager [phone]  If 7PM-7AM, please contact night-coverage [email or web address] Password [email or web address] 01/31/2018, 2:59 PM

## 2018-01-31 NOTE — Progress Notes (Signed)
Pharmacy Antibiotic Note  Barry Curry is a 77 y.o. male admitted on 01/21/2018 with surgical prophylaxis.  Pharmacy has been consulted for Levofloxacin dosing.  Plan: Levofloxacin 750 mg IV x 1 - 60 min before the scheduled VATS procedure  Height: 6' (182.9 cm) Weight: 203 lb (92.1 kg) IBW/kg (Calculated) : 77.6  Temp (24hrs), Avg:98.3 F (36.8 C), Min:97.5 F (36.4 C), Max:99.1 F (37.3 C)  Recent Labs  Lab 01/26/18 0216 01/26/18 0650 01/26/18 0935 01/27/18 0224 01/28/18 0310 01/29/18 0215 01/30/18 0203 01/31/18 0255  WBC 22.9*  --   --  14.6* 16.9* 18.8* 16.8* 14.0*  CREATININE 1.29*  --   --  1.29* 1.48*  --  1.60* 1.47*  LATICACIDVEN  --  1.6 1.8  --   --   --   --   --     Estimated Creatinine Clearance: 46.2 mL/min (A) (by C-G formula based on SCr of 1.47 mg/dL (H)).    Allergies  Allergen Reactions  . Penicillins Rash    Has patient had a PCN reaction causing immediate rash, facial/tongue/throat swelling, SOB or lightheadedness with hypotension: No Has patient had a PCN reaction causing severe rash involving mucus membranes or skin necrosis: No Has patient had a PCN reaction that required hospitalization: No Has patient had a PCN reaction occurring within the last 10 years: No If all of the above answers are "NO", then may proceed with Cephalosporin use.      Thank you for allowing pharmacy to be a part of this patient's care.  Alanda Slim, PharmD, [facility] Clinical Pharmacist Please see AMION for all Pharmacists' Contact Phone Numbers 01/31/2018, 9:34 PM

## 2018-01-31 NOTE — Progress Notes (Signed)
Procedure(s) (LRB): VIDEO ASSISTED THORACOSCOPY (Right) PLEURAL BIOPSY (Right) DRAINAGE OF PLEURAL EFFUSION (Right) TALC PLEURADESIS (Right) Subjective:  Says he is weak but breathing is ok. Ambulating to bathroom   Objective: Vital signs in last 24 hours: Temp:  [97.5 F (36.4 C)-99.1 F (37.3 C)] 98.7 F (37.1 C) (08/05 1134) Pulse Rate:  [84-99] 88 (08/05 1134) Cardiac Rhythm: Normal sinus rhythm;Atrial paced;Bundle branch block (08/05 1605) Resp:  [24-36] 36 (08/05 1134) BP: (90-110)/(58-70) 90/58 (08/05 0929) SpO2:  [95 %-96 %] 96 % (08/05 1134)  Hemodynamic parameters for last 24 hours:    Intake/Output from previous day: 08/04 0701 - 08/05 0700 In: 240 [P.O.:240] Out: -  Intake/Output this shift: No intake/output data recorded.  General appearance: alert and cooperative Heart: regular rate and rhythm, S1, S2 normal, no murmur, click, rub or gallop Lungs: diminished breath sounds right base  Lab Results: Recent Labs    01/30/18 0203 01/31/18 0255  WBC 16.8* 14.0*  HGB 10.6* 10.8*  HCT 32.6* 34.0*  PLT 216 232   BMET:  Recent Labs    01/30/18 0203 01/31/18 0255  NA 137 138  K 3.8 4.0  CL 103 107  CO2 25 24  GLUCOSE 95 91  BUN 29* 24*  CREATININE 1.60* 1.47*  CALCIUM 8.0* 8.1*    PT/INR: No results for input(s): LABPROT, INR in the last 72 hours. ABG No results found for: PHART, HCO3, TCO2, ACIDBASEDEF, O2SAT CBG (last 3)  No results for input(s): GLUCAP in the last 72 hours.  Assessment/Plan:  Cytology from 8/3 thoracentesis still pending. Will plan to proceed with right VATS for biopsy of pleural masses, drainage of effusion and talc pleurodesis if indicated tomorrow. I discussed the procedure with the patient and his daughter including alternatives, benefits and risks and they agree to proceed.  LOS: 9 days    Gaye Pollack 01/31/2018

## 2018-01-31 NOTE — Progress Notes (Signed)
CSW continuing to follow and assist with disposition when stable  Jorge Ny, [place] Social Worker [phone]

## 2018-01-31 NOTE — Anesthesia Preprocedure Evaluation (Addendum)
Anesthesia Evaluation  Patient identified by MRN, date of birth, ID band Patient awake    Reviewed: Allergy & Precautions, NPO status , Patient's Chart, lab work & pertinent test results, reviewed documented beta blocker date and time   History of Anesthesia Complications (+) Emergence Delirium and history of anesthetic complications  Airway Mallampati: II  TM Distance: <3 FB Neck ROM: Limited    Dental no notable dental hx.    Pulmonary former smoker,   Pleural effusion    Pulmonary exam normal breath sounds clear to auscultation       Cardiovascular hypertension, Pt. on home beta blockers and Pt. on medications + CAD  + dysrhythmias Atrial Fibrillation + Valvular Problems/Murmurs AS  Rhythm:Regular Rate:Normal + Systolic murmurs  '19 TTE - Mild LVH. EF 60% to 65%. Moderate AS. Mean   gradient: 32 mm Hg. Peak gradient: 64 mm Hg. Valve area (VTI): 1.12 cm^2. Valve area (Vmax): 1.01 cm^2. Valve area (Vmean): 1.02 cm^2. Trivial MR. Mildly dilated LA and RA. Mild TR. PASP 25 mmHg     Neuro/Psych PSYCHIATRIC DISORDERS Anxiety Depression negative neurological ROS     GI/Hepatic Neg liver ROS, GERD  ,  Endo/Other  negative endocrine ROS  Renal/GU Renal InsufficiencyRenal disease Left renal cancer s/p left nephrectomy     negative genitourinary   Musculoskeletal  (+) Arthritis ,   Abdominal   Peds  Hematology negative hematology ROS (+) anemia ,   Anesthesia Other Findings   Reproductive/Obstetrics                            Anesthesia Physical  Anesthesia Plan  ASA: III  Anesthesia Plan: General   Post-op Pain Management:    Induction: Intravenous  PONV Risk Score and Plan: 3 and Ondansetron, Dexamethasone and Treatment may vary due to age or medical condition  Airway Management Planned: Double Lumen EBT  Additional Equipment: Arterial line, CVP and Ultrasound Guidance Line  Placement  Intra-op Plan:   Post-operative Plan: Possible Post-op intubation/ventilation  Informed Consent: I have reviewed the patients History and Physical, chart, labs and discussed the procedure including the risks, benefits and alternatives for the proposed anesthesia with the patient or authorized representative who has indicated his/her understanding and acceptance.   Dental advisory given  Plan Discussed with: CRNA and Anesthesiologist  Anesthesia Plan Comments:         Anesthesia Quick Evaluation

## 2018-02-01 ENCOUNTER — Inpatient Hospital Stay (HOSPITAL_COMMUNITY): Payer: Medicare Other

## 2018-02-01 ENCOUNTER — Inpatient Hospital Stay (HOSPITAL_COMMUNITY): Payer: Medicare Other | Admitting: Certified Registered Nurse Anesthetist

## 2018-02-01 ENCOUNTER — Encounter (HOSPITAL_COMMUNITY)
Admission: EM | Disposition: A | Discharge status: Skilled Nursing Facility | Payer: Self-pay | Source: Home / Self Care | Attending: Surgery

## 2018-02-01 ENCOUNTER — Encounter (HOSPITAL_COMMUNITY): Payer: Self-pay | Admitting: Certified Registered Nurse Anesthetist

## 2018-02-01 DIAGNOSIS — Z9889 Other specified postprocedural states: Secondary | ICD-10-CM

## 2018-02-01 HISTORY — PX: PLEURAL BIOPSY: SHX5082

## 2018-02-01 HISTORY — PX: VIDEO ASSISTED THORACOSCOPY: SHX5073

## 2018-02-01 HISTORY — PX: PLEURAL EFFUSION DRAINAGE: SHX5099

## 2018-02-01 HISTORY — PX: TALC PLEURODESIS: SHX2506

## 2018-02-01 LAB — BLOOD GAS, ARTERIAL
Acid-base deficit: 4.6 mmol/L — ABNORMAL HIGH (ref 0.0–2.0)
Bicarbonate: 25.5 mmol/L (ref 20.0–28.0)
DRAWN BY: 331761
FIO2: 1
O2 Saturation: 98.3 %
PATIENT TEMPERATURE: 98.6
pCO2 arterial: 104 mmHg (ref 32.0–48.0)
pH, Arterial: 7.019 — CL (ref 7.350–7.450)
pO2, Arterial: 220 mmHg — ABNORMAL HIGH (ref 83.0–108.0)

## 2018-02-01 LAB — POCT I-STAT 3, ART BLOOD GAS (G3+)
ACID-BASE DEFICIT: 3 mmol/L — AB (ref 0.0–2.0)
Acid-base deficit: 2 mmol/L (ref 0.0–2.0)
Acid-base deficit: 5 mmol/L — ABNORMAL HIGH (ref 0.0–2.0)
Acid-base deficit: 6 mmol/L — ABNORMAL HIGH (ref 0.0–2.0)
Acid-base deficit: 4 mmol/L — ABNORMAL HIGH (ref 0.0–2.0)
BICARBONATE: 24.7 mmol/L (ref 20.0–28.0)
BICARBONATE: 20.8 mmol/L (ref 20.0–28.0)
Bicarbonate: 22.3 mmol/L (ref 20.0–28.0)
Bicarbonate: 22 mmol/L (ref 20.0–28.0)
Bicarbonate: 21 mmol/L (ref 20.0–28.0)
O2 SAT: 94 %
O2 Saturation: 100 %
O2 Saturation: 98 %
O2 Saturation: 98 %
O2 Saturation: 100 %
PCO2 ART: 48.7 mmHg — AB (ref 32.0–48.0)
PCO2 ART: 46.3 mmHg (ref 32.0–48.0)
PCO2 ART: 43.9 mmHg (ref 32.0–48.0)
PH ART: 7.29 — AB (ref 7.350–7.450)
PH ART: 7.309 — AB (ref 7.350–7.450)
PH ART: 7.425 (ref 7.350–7.450)
PO2 ART: 227 mmHg — AB (ref 83.0–108.0)
PO2 ART: 128 mmHg — AB (ref 83.0–108.0)
PO2 ART: 117 mmHg — AB (ref 83.0–108.0)
Patient temperature: 98.1
TCO2: 26 mmol/L (ref 22–32)
TCO2: 24 mmol/L (ref 22–32)
TCO2: 22 mmol/L (ref 22–32)
TCO2: 23 mmol/L (ref 22–32)
TCO2: 22 mmol/L (ref 22–32)
pCO2 arterial: 51.3 mmHg — ABNORMAL HIGH (ref 32.0–48.0)
pCO2 arterial: 31.7 mmHg — ABNORMAL LOW (ref 32.0–48.0)
pH, Arterial: 7.269 — ABNORMAL LOW (ref 7.350–7.450)
pH, Arterial: 7.258 — ABNORMAL LOW (ref 7.350–7.450)
pO2, Arterial: 80 mmHg — ABNORMAL LOW (ref 83.0–108.0)
pO2, Arterial: 330 mmHg — ABNORMAL HIGH (ref 83.0–108.0)

## 2018-02-01 LAB — COMPREHENSIVE METABOLIC PANEL
ALBUMIN: 2.3 g/dL — AB (ref 3.5–5.0)
ALK PHOS: 80 U/L (ref 38–126)
ALT: 19 U/L (ref 0–44)
ANION GAP: 11 (ref 5–15)
AST: 25 U/L (ref 15–41)
BILIRUBIN TOTAL: 0.9 mg/dL (ref 0.3–1.2)
BUN: 20 mg/dL (ref 8–23)
CALCIUM: 8.2 mg/dL — AB (ref 8.9–10.3)
CO2: 23 mmol/L (ref 22–32)
Chloride: 102 mmol/L (ref 98–111)
Creatinine, Ser: 1.49 mg/dL — ABNORMAL HIGH (ref 0.61–1.24)
GFR calc Af Amer: 50 mL/min — ABNORMAL LOW (ref >60)
GFR calc non Af Amer: 44 mL/min — ABNORMAL LOW (ref >60)
GLUCOSE: 85 mg/dL (ref 70–99)
Potassium: 4.3 mmol/L (ref 3.5–5.1)
SODIUM: 136 mmol/L (ref 135–145)
TOTAL PROTEIN: 5.6 g/dL — AB (ref 6.5–8.1)

## 2018-02-01 LAB — POCT I-STAT 7, (LYTES, BLD GAS, ICA,H+H)
Bicarbonate: 27.1 mmol/L (ref 20.0–28.0)
CALCIUM ION: 1.21 mmol/L (ref 1.15–1.40)
HEMATOCRIT: 31 % — AB (ref 39.0–52.0)
HEMOGLOBIN: 10.5 g/dL — AB (ref 13.0–17.0)
O2 Saturation: 99 %
PCO2 ART: 57.6 mmHg — AB (ref 32.0–48.0)
PH ART: 7.281 — AB (ref 7.350–7.450)
POTASSIUM: 4.6 mmol/L (ref 3.5–5.1)
SODIUM: 138 mmol/L (ref 135–145)
TCO2: 29 mmol/L (ref 22–32)
pO2, Arterial: 171 mmHg — ABNORMAL HIGH (ref 83.0–108.0)

## 2018-02-01 LAB — BASIC METABOLIC PANEL
ANION GAP: 9 (ref 5–15)
BUN: 21 mg/dL (ref 8–23)
CHLORIDE: 108 mmol/L (ref 98–111)
CO2: 21 mmol/L — AB (ref 22–32)
Calcium: 7.7 mg/dL — ABNORMAL LOW (ref 8.9–10.3)
Creatinine, Ser: 1.33 mg/dL — ABNORMAL HIGH (ref 0.61–1.24)
GFR calc non Af Amer: 50 mL/min — ABNORMAL LOW (ref >60)
GFR, EST AFRICAN AMERICAN: 58 mL/min — AB (ref >60)
Glucose, Bld: 152 mg/dL — ABNORMAL HIGH (ref 70–99)
Potassium: 4.4 mmol/L (ref 3.5–5.1)
Sodium: 138 mmol/L (ref 135–145)

## 2018-02-01 LAB — GLUCOSE, CAPILLARY
GLUCOSE-CAPILLARY: 205 mg/dL — AB (ref 70–99)
GLUCOSE-CAPILLARY: 179 mg/dL — AB (ref 70–99)
Glucose-Capillary: 122 mg/dL — ABNORMAL HIGH (ref 70–99)
Glucose-Capillary: 131 mg/dL — ABNORMAL HIGH (ref 70–99)

## 2018-02-01 LAB — CBC
HEMATOCRIT: 30.7 % — AB (ref 39.0–52.0)
Hemoglobin: 9.7 g/dL — ABNORMAL LOW (ref 13.0–17.0)
MCH: 28 pg (ref 26.0–34.0)
MCHC: 31.6 g/dL (ref 30.0–36.0)
MCV: 88.7 fL (ref 78.0–100.0)
PLATELETS: 256 10*3/uL (ref 150–400)
RBC: 3.46 MIL/uL — ABNORMAL LOW (ref 4.22–5.81)
RDW: 19.1 % — ABNORMAL HIGH (ref 11.5–15.5)
WBC: 25.5 10*3/uL — AB (ref 4.0–10.5)

## 2018-02-01 SURGERY — VIDEO ASSISTED THORACOSCOPY
Anesthesia: General | Laterality: Right

## 2018-02-01 MED ORDER — PHENYLEPHRINE 40 MCG/ML (10ML) SYRINGE FOR IV PUSH (FOR BLOOD PRESSURE SUPPORT)
PREFILLED_SYRINGE | INTRAVENOUS | Status: AC
  Filled 2018-02-01: qty 10

## 2018-02-01 MED ORDER — FENTANYL CITRATE (PF) 250 MCG/5ML IJ SOLN
INTRAMUSCULAR | Status: DC | PRN
  Administered 2018-02-01: 50 ug via INTRAVENOUS
  Administered 2018-02-01: 25 ug via INTRAVENOUS
  Administered 2018-02-01: 50 ug via INTRAVENOUS
  Administered 2018-02-01: 75 ug via INTRAVENOUS
  Administered 2018-02-01 (×2): 50 ug via INTRAVENOUS

## 2018-02-01 MED ORDER — SUGAMMADEX SODIUM 200 MG/2ML IV SOLN
INTRAVENOUS | Status: DC | PRN
  Administered 2018-02-01: 200 mg via INTRAVENOUS

## 2018-02-01 MED ORDER — DIPHENHYDRAMINE HCL 50 MG/ML IJ SOLN: 12.5000 mg | Freq: Four times a day (QID) | INTRAMUSCULAR | Status: DC | PRN

## 2018-02-01 MED ORDER — BISACODYL 5 MG PO TBEC: 10.0000 mg | DELAYED_RELEASE_TABLET | Freq: Every day | ORAL | Status: DC

## 2018-02-01 MED ORDER — TALC (STERITALC) POWDER FOR INTRAPLEURAL USE
INTRAPLEURAL | Status: DC | PRN
  Administered 2018-02-01: 4 g via INTRAPLEURAL

## 2018-02-01 MED ORDER — DEXTROSE-NACL 5-0.9 % IV SOLN
INTRAVENOUS | Status: DC
  Administered 2018-02-01: 75 mL/h via INTRAVENOUS
  Administered 2018-02-01 – 2018-02-02 (×2): via INTRAVENOUS

## 2018-02-01 MED ORDER — PROPOFOL 10 MG/ML IV BOLUS
INTRAVENOUS | Status: DC | PRN
  Administered 2018-02-01: 90 mg via INTRAVENOUS
  Administered 2018-02-01: 130 mg via INTRAVENOUS

## 2018-02-01 MED ORDER — FENTANYL CITRATE (PF) 100 MCG/2ML IJ SOLN
50.0000 ug | INTRAMUSCULAR | Status: AC | PRN
  Administered 2018-02-01: 50 ug via INTRAVENOUS

## 2018-02-01 MED ORDER — ACETAMINOPHEN 160 MG/5ML PO SOLN
1000.0000 mg | Freq: Four times a day (QID) | ORAL | Status: DC
  Administered 2018-02-01 – 2018-02-02 (×2): 1000 mg via ORAL
  Filled 2018-02-01 (×2): qty 40.6

## 2018-02-01 MED ORDER — CHLORHEXIDINE GLUCONATE 0.12% ORAL RINSE (MEDLINE KIT)
15.0000 mL | Freq: Two times a day (BID) | OROMUCOSAL | Status: DC
  Administered 2018-02-01 – 2018-02-02 (×3): 15 mL via OROMUCOSAL

## 2018-02-01 MED ORDER — LACTATED RINGERS IV SOLN
INTRAVENOUS | Status: DC | PRN
  Administered 2018-02-01: 08:00:00 via INTRAVENOUS

## 2018-02-01 MED ORDER — MORPHINE SULFATE 2 MG/ML IV SOLN
INTRAVENOUS | Status: DC
  Administered 2018-02-01: 15:00:00 via INTRAVENOUS
  Filled 2018-02-01 (×2): qty 30

## 2018-02-01 MED ORDER — FENTANYL CITRATE (PF) 250 MCG/5ML IJ SOLN
INTRAMUSCULAR | Status: AC
  Filled 2018-02-01: qty 5

## 2018-02-01 MED ORDER — ONDANSETRON HCL 4 MG/2ML IJ SOLN
INTRAMUSCULAR | Status: AC
  Filled 2018-02-01: qty 2

## 2018-02-01 MED ORDER — MIDAZOLAM HCL 2 MG/2ML IJ SOLN
INTRAMUSCULAR | Status: AC
  Filled 2018-02-01: qty 2

## 2018-02-01 MED ORDER — VASOPRESSIN 20 UNIT/ML IV SOLN
INTRAVENOUS | Status: DC | PRN
  Administered 2018-02-01 (×2): 1 [IU] via INTRAVENOUS
  Administered 2018-02-01: 2 [IU] via INTRAVENOUS

## 2018-02-01 MED ORDER — POTASSIUM CHLORIDE 10 MEQ/50ML IV SOLN: 10.0000 meq | Freq: Every day | INTRAVENOUS | Status: DC | PRN

## 2018-02-01 MED ORDER — PROPOFOL 10 MG/ML IV BOLUS
INTRAVENOUS | Status: AC
  Filled 2018-02-01: qty 20

## 2018-02-01 MED ORDER — LIDOCAINE 2% (20 MG/ML) 5 ML SYRINGE
INTRAMUSCULAR | Status: AC
  Filled 2018-02-01: qty 5

## 2018-02-01 MED ORDER — ROCURONIUM BROMIDE 10 MG/ML (PF) SYRINGE
PREFILLED_SYRINGE | INTRAVENOUS | Status: AC
  Filled 2018-02-01: qty 10

## 2018-02-01 MED ORDER — EPHEDRINE SULFATE 50 MG/ML IJ SOLN
INTRAMUSCULAR | Status: AC
  Filled 2018-02-01: qty 1

## 2018-02-01 MED ORDER — ONDANSETRON HCL 4 MG/2ML IJ SOLN: 4.0000 mg | Freq: Four times a day (QID) | INTRAMUSCULAR | Status: DC | PRN

## 2018-02-01 MED ORDER — SODIUM CHLORIDE 0.9 % IV SOLN
2.0000 g | INTRAVENOUS | Status: DC
  Filled 2018-02-01: qty 2

## 2018-02-01 MED ORDER — DIPHENHYDRAMINE HCL 12.5 MG/5ML PO ELIX: 12.5000 mg | ORAL_SOLUTION | Freq: Four times a day (QID) | ORAL | Status: DC | PRN

## 2018-02-01 MED ORDER — PHENYLEPHRINE HCL 10 MG/ML IJ SOLN
INTRAMUSCULAR | Status: DC | PRN
  Administered 2018-02-01 (×5): 160 ug via INTRAVENOUS
  Administered 2018-02-01: 100 ug via INTRAVENOUS
  Administered 2018-02-01: 80 ug via INTRAVENOUS
  Administered 2018-02-01: 120 ug via INTRAVENOUS
  Administered 2018-02-01: 160 ug via INTRAVENOUS
  Administered 2018-02-01: 100 ug via INTRAVENOUS

## 2018-02-01 MED ORDER — ALBUTEROL SULFATE HFA 108 (90 BASE) MCG/ACT IN AERS
INHALATION_SPRAY | RESPIRATORY_TRACT | Status: DC | PRN
  Administered 2018-02-01: 6 via RESPIRATORY_TRACT

## 2018-02-01 MED ORDER — LEVALBUTEROL HCL 0.63 MG/3ML IN NEBU
0.6300 mg | INHALATION_SOLUTION | Freq: Four times a day (QID) | RESPIRATORY_TRACT | Status: DC
  Administered 2018-02-01 – 2018-02-02 (×6): 0.63 mg via RESPIRATORY_TRACT
  Filled 2018-02-01 (×6): qty 3

## 2018-02-01 MED ORDER — GLYCOPYRROLATE PF 0.2 MG/ML IJ SOSY
PREFILLED_SYRINGE | INTRAMUSCULAR | Status: AC
  Filled 2018-02-01: qty 1

## 2018-02-01 MED ORDER — MIDAZOLAM HCL 2 MG/2ML IJ SOLN
INTRAMUSCULAR | Status: DC | PRN
  Administered 2018-02-01 (×2): 1 mg via INTRAVENOUS

## 2018-02-01 MED ORDER — LACTATED RINGERS IV SOLN
INTRAVENOUS | Status: DC | PRN
  Administered 2018-02-01 (×3): via INTRAVENOUS

## 2018-02-01 MED ORDER — ROCURONIUM BROMIDE 100 MG/10ML IV SOLN
INTRAVENOUS | Status: DC | PRN
  Administered 2018-02-01: 50 mg via INTRAVENOUS

## 2018-02-01 MED ORDER — METOCLOPRAMIDE HCL 5 MG/ML IJ SOLN
10.0000 mg | Freq: Four times a day (QID) | INTRAMUSCULAR | Status: AC
  Administered 2018-02-01 – 2018-02-02 (×4): 10 mg via INTRAVENOUS
  Filled 2018-02-01 (×4): qty 2

## 2018-02-01 MED ORDER — TRAMADOL HCL 50 MG PO TABS
50.0000 mg | ORAL_TABLET | Freq: Four times a day (QID) | ORAL | Status: DC | PRN
  Administered 2018-02-03 – 2018-02-06 (×4): 50 mg via ORAL
  Filled 2018-02-01 (×4): qty 1

## 2018-02-01 MED ORDER — VASOPRESSIN 20 UNIT/ML IV SOLN
INTRAVENOUS | Status: AC
  Filled 2018-02-01: qty 1

## 2018-02-01 MED ORDER — DEXAMETHASONE SODIUM PHOSPHATE 10 MG/ML IJ SOLN
INTRAMUSCULAR | Status: AC
  Filled 2018-02-01: qty 1

## 2018-02-01 MED ORDER — SODIUM BICARBONATE 8.4 % IV SOLN
50.0000 meq | Freq: Once | INTRAVENOUS | Status: AC
  Administered 2018-02-01: 50 meq via INTRAVENOUS
  Filled 2018-02-01: qty 100

## 2018-02-01 MED ORDER — OXYCODONE HCL 5 MG PO TABS: 5.0000 mg | ORAL_TABLET | ORAL | Status: DC | PRN

## 2018-02-01 MED ORDER — LIDOCAINE 2% (20 MG/ML) 5 ML SYRINGE
INTRAMUSCULAR | Status: DC | PRN
  Administered 2018-02-01: 80 mg via INTRAVENOUS

## 2018-02-01 MED ORDER — ACETAMINOPHEN 500 MG PO TABS
1000.0000 mg | ORAL_TABLET | Freq: Four times a day (QID) | ORAL | Status: DC
  Administered 2018-02-01: 1000 mg via ORAL
  Filled 2018-02-01: qty 2

## 2018-02-01 MED ORDER — PHENYLEPHRINE 40 MCG/ML (10ML) SYRINGE FOR IV PUSH (FOR BLOOD PRESSURE SUPPORT)
PREFILLED_SYRINGE | INTRAVENOUS | Status: DC | PRN
  Administered 2018-02-01 (×3): 120 ug via INTRAVENOUS
  Administered 2018-02-01: 40 ug via INTRAVENOUS

## 2018-02-01 MED ORDER — INSULIN ASPART 100 UNIT/ML ~~LOC~~ SOLN
0.0000 [IU] | SUBCUTANEOUS | Status: DC
  Administered 2018-02-01: 8 [IU] via SUBCUTANEOUS
  Administered 2018-02-01: 2 [IU] via SUBCUTANEOUS
  Administered 2018-02-01: 4 [IU] via SUBCUTANEOUS
  Administered 2018-02-01 – 2018-02-02 (×3): 2 [IU] via SUBCUTANEOUS

## 2018-02-01 MED ORDER — FENTANYL CITRATE (PF) 100 MCG/2ML IJ SOLN
INTRAMUSCULAR | Status: AC
  Administered 2018-02-01: 50 ug via INTRAVENOUS
  Filled 2018-02-01: qty 2

## 2018-02-01 MED ORDER — DEXMEDETOMIDINE HCL IN NACL 400 MCG/100ML IV SOLN
0.4000 ug/kg/h | INTRAVENOUS | Status: DC
  Administered 2018-02-01: .5 ug/kg/h via INTRAVENOUS
  Administered 2018-02-01: 0.4 ug/kg/h via INTRAVENOUS
  Administered 2018-02-02: 0.5 ug/kg/h via INTRAVENOUS
  Filled 2018-02-01 (×2): qty 100

## 2018-02-01 MED ORDER — ALBUMIN HUMAN 5 % IV SOLN
INTRAVENOUS | Status: DC | PRN
  Administered 2018-02-01 (×2): via INTRAVENOUS

## 2018-02-01 MED ORDER — PROPOFOL 10 MG/ML IV BOLUS
INTRAVENOUS | Status: DC | PRN
  Administered 2018-02-01: 10 mg via INTRAVENOUS

## 2018-02-01 MED ORDER — ORAL CARE MOUTH RINSE
15.0000 mL | OROMUCOSAL | Status: DC
  Administered 2018-02-01 – 2018-02-02 (×7): 15 mL via OROMUCOSAL

## 2018-02-01 MED ORDER — SUCCINYLCHOLINE CHLORIDE 200 MG/10ML IV SOSY
PREFILLED_SYRINGE | INTRAVENOUS | Status: AC
  Filled 2018-02-01: qty 10

## 2018-02-01 MED ORDER — NALOXONE HCL 0.4 MG/ML IJ SOLN: 0.4000 mg | INTRAMUSCULAR | Status: DC | PRN

## 2018-02-01 MED ORDER — SENNOSIDES-DOCUSATE SODIUM 8.6-50 MG PO TABS
1.0000 | ORAL_TABLET | Freq: Every day | ORAL | Status: DC
  Administered 2018-02-01 – 2018-02-05 (×2): 1 via ORAL
  Filled 2018-02-01: qty 1

## 2018-02-01 MED ORDER — SODIUM CHLORIDE 0.9% FLUSH: 9.0000 mL | INTRAVENOUS | Status: DC | PRN

## 2018-02-01 MED ORDER — SODIUM CHLORIDE 0.9 % IV SOLN
INTRAVENOUS | Status: DC | PRN
  Administered 2018-02-01: 50 ug/min via INTRAVENOUS

## 2018-02-01 MED ORDER — TALC (STERITALC) POWDER FOR INTRAPLEURAL USE
INTRAPLEURAL | Status: AC
  Filled 2018-02-01: qty 4

## 2018-02-01 MED ORDER — DEXAMETHASONE SODIUM PHOSPHATE 10 MG/ML IJ SOLN
INTRAMUSCULAR | Status: DC | PRN
  Administered 2018-02-01: 10 mg via INTRAVENOUS

## 2018-02-01 MED ORDER — ROCURONIUM BROMIDE 10 MG/ML (PF) SYRINGE
PREFILLED_SYRINGE | INTRAVENOUS | Status: DC | PRN
  Administered 2018-02-01: 70 mg via INTRAVENOUS
  Administered 2018-02-01: 60 mg via INTRAVENOUS

## 2018-02-01 MED ORDER — PHENYLEPHRINE HCL-NACL 10-0.9 MG/250ML-% IV SOLN
0.0000 ug/min | INTRAVENOUS | Status: DC
  Administered 2018-02-01: 20 ug/min via INTRAVENOUS
  Administered 2018-02-01: 170 ug/min via INTRAVENOUS
  Administered 2018-02-01: 150 ug/min via INTRAVENOUS
  Administered 2018-02-01: 110 ug/min via INTRAVENOUS
  Administered 2018-02-02: 20 ug/min via INTRAVENOUS
  Administered 2018-02-02: 70 ug/min via INTRAVENOUS
  Administered 2018-02-02: 40 ug/min via INTRAVENOUS
  Administered 2018-02-02: 30 ug/min via INTRAVENOUS
  Administered 2018-02-02: 50 ug/min via INTRAVENOUS
  Filled 2018-02-01 (×11): qty 250

## 2018-02-01 MED ORDER — FENTANYL CITRATE (PF) 100 MCG/2ML IJ SOLN
50.0000 ug | INTRAMUSCULAR | Status: DC | PRN
  Administered 2018-02-01 – 2018-02-02 (×3): 50 ug via INTRAVENOUS
  Filled 2018-02-01 (×3): qty 2

## 2018-02-01 MED ORDER — 0.9 % SODIUM CHLORIDE (POUR BTL) OPTIME
TOPICAL | Status: DC | PRN
  Administered 2018-02-01: 2000 mL

## 2018-02-01 SURGICAL SUPPLY — 76 items
ADH SKN CLS APL DERMABOND .7 (GAUZE/BANDAGES/DRESSINGS) ×1
APL SRG 22X2 LUM MLBL SLNT (VASCULAR PRODUCTS) ×2
APL SWBSTK 6 STRL LF DISP (MISCELLANEOUS) ×1
APPLICATOR COTTON TIP 6 STRL (MISCELLANEOUS) ×1 IMPLANT
APPLICATOR COTTON TIP 6IN STRL (MISCELLANEOUS) ×3
APPLICATOR TIP EXT COSEAL (VASCULAR PRODUCTS) ×6 IMPLANT
CANISTER SUCT 3000ML PPV (MISCELLANEOUS) ×6 IMPLANT
CATH ROBINSON RED A/P 16FR (CATHETERS) ×3 IMPLANT
CATH ROBINSON RED A/P 18FR (CATHETERS) IMPLANT
CATH THORACIC 28FR (CATHETERS) IMPLANT
CATH THORACIC 36FR (CATHETERS) ×3 IMPLANT
CATH THORACIC 36FR RT ANG (CATHETERS) IMPLANT
CLIP VESOCCLUDE MED 6/CT (CLIP) ×3 IMPLANT
CONT SPEC 4OZ CLIKSEAL STRL BL (MISCELLANEOUS) ×12 IMPLANT
COVER SURGICAL LIGHT HANDLE (MISCELLANEOUS) IMPLANT
DERMABOND ADVANCED (GAUZE/BANDAGES/DRESSINGS) ×2
DERMABOND ADVANCED .7 DNX12 (GAUZE/BANDAGES/DRESSINGS) ×1 IMPLANT
DRAIN CHANNEL 28F RND 3/8 FF (WOUND CARE) IMPLANT
DRAIN CHANNEL 32F RND 10.7 FF (WOUND CARE) IMPLANT
DRAPE LAPAROSCOPIC ABDOMINAL (DRAPES) ×3 IMPLANT
ELECT BLADE 4.0 EZ CLEAN MEGAD (MISCELLANEOUS) ×3
ELECT REM PT RETURN 9FT ADLT (ELECTROSURGICAL) ×3
ELECTRODE BLDE 4.0 EZ CLN MEGD (MISCELLANEOUS) ×1 IMPLANT
ELECTRODE REM PT RTRN 9FT ADLT (ELECTROSURGICAL) ×1 IMPLANT
GAUZE SPONGE 4X4 12PLY STRL (GAUZE/BANDAGES/DRESSINGS) ×3 IMPLANT
GAUZE SPONGE 4X4 12PLY STRL LF (GAUZE/BANDAGES/DRESSINGS) ×3 IMPLANT
GLOVE BIO SURGEON STRL SZ7 (GLOVE) ×9 IMPLANT
GLOVE BIO SURGEON STRL SZ7.5 (GLOVE) ×9 IMPLANT
GLOVE BIOGEL PI IND STRL 6.5 (GLOVE) ×3 IMPLANT
GLOVE BIOGEL PI IND STRL 7.0 (GLOVE) ×3 IMPLANT
GLOVE BIOGEL PI INDICATOR 6.5 (GLOVE) ×6
GLOVE BIOGEL PI INDICATOR 7.0 (GLOVE) ×6
GLOVE EUDERMIC 7 POWDERFREE (GLOVE) ×6 IMPLANT
GOWN STRL REUS W/ TWL LRG LVL3 (GOWN DISPOSABLE) ×4 IMPLANT
GOWN STRL REUS W/ TWL XL LVL3 (GOWN DISPOSABLE) ×1 IMPLANT
GOWN STRL REUS W/TWL LRG LVL3 (GOWN DISPOSABLE) ×12
GOWN STRL REUS W/TWL XL LVL3 (GOWN DISPOSABLE) ×2
KIT BASIN OR (CUSTOM PROCEDURE TRAY) ×3 IMPLANT
KIT TURNOVER KIT B (KITS) ×3 IMPLANT
NS IRRIG 1000ML POUR BTL (IV SOLUTION) ×6 IMPLANT
PACK CHEST (CUSTOM PROCEDURE TRAY) ×3 IMPLANT
PAD ARMBOARD 7.5X6 YLW CONV (MISCELLANEOUS) ×6 IMPLANT
SEALANT SURG COSEAL 4ML (VASCULAR PRODUCTS) ×3 IMPLANT
SEALANT SURG COSEAL 8ML (VASCULAR PRODUCTS) ×3 IMPLANT
SOLUTION ANTI FOG 6CC (MISCELLANEOUS) ×3 IMPLANT
SUT PROLENE 3 0 SH DA (SUTURE) IMPLANT
SUT PROLENE 4 0 RB 1 (SUTURE)
SUT PROLENE 4-0 RB1 .5 CRCL 36 (SUTURE) IMPLANT
SUT SILK  1 MH (SUTURE) ×4
SUT SILK 1 MH (SUTURE) ×2 IMPLANT
SUT SILK 2 0SH CR/8 30 (SUTURE) IMPLANT
SUT VIC AB 1 CTX 36 (SUTURE) ×2
SUT VIC AB 1 CTX36XBRD ANBCTR (SUTURE) ×1 IMPLANT
SUT VIC AB 2 TP1 27 (SUTURE) IMPLANT
SUT VIC AB 2-0 CT1 27 (SUTURE) ×2
SUT VIC AB 2-0 CT1 TAPERPNT 27 (SUTURE) ×1 IMPLANT
SUT VIC AB 2-0 CTX 36 (SUTURE) IMPLANT
SUT VIC AB 2-0 UR6 27 (SUTURE) ×3 IMPLANT
SUT VIC AB 3-0 MH 27 (SUTURE) IMPLANT
SUT VIC AB 3-0 SH 27 (SUTURE)
SUT VIC AB 3-0 SH 27X BRD (SUTURE) IMPLANT
SUT VIC AB 3-0 X1 27 (SUTURE) ×3 IMPLANT
SUT VICRYL 2 TP 1 (SUTURE) ×3 IMPLANT
SYR BULB IRRIGATION 50ML (SYRINGE) IMPLANT
SYSTEM SAHARA CHEST DRAIN ATS (WOUND CARE) ×3 IMPLANT
TAPE CLOTH SURG 4X10 WHT LF (GAUZE/BANDAGES/DRESSINGS) ×3 IMPLANT
TIP APPLICATOR SPRAY EXTEND 16 (VASCULAR PRODUCTS) IMPLANT
TOWEL GREEN STERILE (TOWEL DISPOSABLE) ×3 IMPLANT
TOWEL GREEN STERILE FF (TOWEL DISPOSABLE) ×6 IMPLANT
TRAP SPECIMEN MUCOUS 40CC (MISCELLANEOUS) ×3 IMPLANT
TRAY FOLEY MTR SLVR 14FR STAT (SET/KITS/TRAYS/PACK) IMPLANT
TRAY FOLEY W/BAG SLVR 16FR (SET/KITS/TRAYS/PACK) ×2
TRAY FOLEY W/BAG SLVR 16FR ST (SET/KITS/TRAYS/PACK) ×1 IMPLANT
TROCAR XCEL BLADELESS 5X75MML (TROCAR) ×3 IMPLANT
TUNNELER SHEATH ON-Q 11GX8 DSP (PAIN MANAGEMENT) IMPLANT
WATER STERILE IRR 1000ML POUR (IV SOLUTION) ×6 IMPLANT

## 2018-02-01 NOTE — Anesthesia Procedure Notes (Signed)
Procedure Name: Intubation Date/Time: 02/01/2018 7:52 AM Performed by: Marsa Aris, CRNA Pre-anesthesia Checklist: Patient identified, Emergency Drugs available, Suction available, Patient being monitored and Timeout performed Patient Re-evaluated:Patient Re-evaluated prior to induction Oxygen Delivery Method: Circle system utilized Preoxygenation: Pre-oxygenation with 100% oxygen Induction Type: IV induction Ventilation: Mask ventilation without difficulty Laryngoscope Size: Glidescope and 3 Grade View: Grade I Endobronchial tube: Double lumen EBT, EBT position confirmed by fiberoptic bronchoscope, EBT position confirmed by auscultation and Bronchial Blocker placed under direct vision and 41 Fr Number of attempts: 1 Airway Equipment and Method: Stylet,  Video-laryngoscopy and Fiberoptic brochoscope Placement Confirmation: ETT inserted through vocal cords under direct vision,  positive ETCO2,  CO2 detector and breath sounds checked- equal and bilateral Tube secured with: Tape Dental Injury: Teeth and Oropharynx as per pre-operative assessment  Difficulty Due To: Difficult Airway- due to anterior larynx Comments: Recommend Glidescope for anterior larynx. Dentition same as pre-procedure, C-spine neutrality maintained

## 2018-02-01 NOTE — Op Note (Signed)
CARDIOTHORACIC SURGERY OPERATIVE NOTE:  BERK PILOT [id number] 02/01/2018   Preoperative Dx:  Right pleural masses with recurrent right pleural effusion  Postoperative Dx: Same   Procedure: Right video-assisted thoracoscopy, Right muscle-sparing thoracotomy for evacuation of complex bloody right pleural effusion, biopsy of pleural implants, talc pleurodesis.  Surgeon: Dr. Gaye Pollack   Assistant: Jadene Pierini, PA-C  Anesthesia: GET   Clinical History:   The patient is a 77 year old gentleman with a complicated medical history including nephrectomy for clear cell carcinoma, melanoma, atrial fibrillation, moderate to severe aortic stenosis with a mean gradient of 32 mmHg and a dimensionless index of 0.24 by recent echo, normal LV systolic function with ejection fraction of 60 to 65%, hypertension, hyperlipidemia, arthritis, and depression who developed shortness of breath in late June.  He had a CT scan of the chest on 12/26/2017 that showed a moderate size right pleural effusion with no chest wall nodule or mass.  He has had 4 thoracenteses since.  He had his first thoracentesis on July 1 draining 670 cc.  He had some improvement that but then developed recurrent dyspnea and was treated with antibiotics without improvement.  A CT scan of the chest on 01/21/2018 showed recurrent effusion and pleural nodules.  He had 2 L of pleural effusion drained by thoracentesis on 727 with incomplete drainage and then another 2 L drained on 01/26/2018.  Cytology on both specimens were negative.  He had a for thoracentesis on 01/27/2018 draining 800 cc.  Cytology on this is still pending.  He has been followed by Dr. Marin Olp and he felt that obtaining a biopsy of the pleural-based masses was important to establish a diagnosis for this patient.  I felt that this could probably be performed by video-assisted thoracoscopy and would allow drainage of any residual effusion and talc pleurodesis if needed.  I discussed  the operative procedure with the patient and his daughters including alternatives, benefits, and risks including but not limited to bleeding, infection, injury to the lung, prolonged air leak, respiratory failure, and the possibility that this may not yield an absolute diagnosis.  Understood and agreed to proceed.  Operative procedure:   The patient was seen in the preoperative holding area. The proper patient, proper operative side, proper operation were confirmed after reviewing his history and chest x-ray. The right side of the chest was signed by me. Preoperative intravenous antibiotics were given. He was taken back to the operating room and placed on table in the supine position. After induction of general endotracheal anesthesia using a double-lumen tube, a foley catheter was placed in the bladder using sterile technique. Lower extremity sequential compression devices were used. The patient was positioned in the left lateral decubitus position with the right side up.  The right side of the chest was prepped with Betadine soap and solution and draped in the usual sterile manner. A timeout was taken and the proper patient, proper operation, and proper operative side were confirmed with nursing and anesthesia staff. A 1 cm incision was made in the midaxillary line at about the eighth intercostal space. The right pleural space was entered bluntly with a hemostat and an 8 mm trocar was inserted. The 30 thoracoscope was inserted and the pleural space inspected.  There was a bloody pleural effusion present.  This was evacuated.  It then became evident that the pleural space was filled with bloody fibrinous exudate as well as some hematoma.  A second 1 cm incision was made in the posterior  axillary line at the 6th intercostal space for insertion of instruments.  Unfortunately this was not a free space due to the fibrinous exudate present.  It was apparent that a small thoracotomy would be necessary to allow  evacuation of the hematoma and biopsy of the pleural masses.  Then a short lateral muscle-sparing thoracotomy was performed and the pleural spaced entered through the 7th ICS. The bloody fibrinous material filling the pleural space was removed. There was some blood clot present and this was removed. Examination of the pleural space showed multiple tumor implants on the visceral and parietal pleura. A biopsy was taken for frozen section and Dr. 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 Several other nodules were biopsied for permanent section. The chest was irrigated with saline. Talc pleurodesis was performed with 4 grams of sterile talc. A 36 F chest tube was placed through a separate incision and positioned posteriorly up to the apex. The ribs were approximated with #2 Vicryl pericostal sutures. The muscles were returned to their normal anatomic position. Subcutaneous tissue was closed with continuous 2-0 Vicryl suture and skin with continuous 3-0 Vicryl subcuticular closure.  The chest tubes were connected to Pleur-evac suction. The sponge needle and instrument counts were correct according to the scrub nurse. The patient was then turned into the supine position and extubated but said he felt like he couldn't breath, was very anxious and desaturated so he was reintubated and  transported to the intensive care unit in stable condition.

## 2018-02-01 NOTE — Progress Notes (Signed)
Patient interviewed in the pre-op area, daughter at bedside. Patient able to confirm name, DOB, procedure, laterality, allergies and npo status. Patient denies any pain at this time. Patient does complain of SOB.   Leatha Gilding, RN

## 2018-02-01 NOTE — Anesthesia Procedure Notes (Signed)
Procedure Name: Intubation Date/Time: 02/01/2018 10:29 AM Performed by: Marsa Aris, CRNA Pre-anesthesia Checklist: Patient identified, Emergency Drugs available, Suction available and Patient being monitored Patient Re-evaluated:Patient Re-evaluated prior to induction Oxygen Delivery Method: Circle system utilized Preoxygenation: Pre-oxygenation with 100% oxygen Induction Type: IV induction Ventilation: Mask ventilation without difficulty Laryngoscope Size: Glidescope and 4 Grade View: Grade I Tube type: Subglottic suction tube Tube size: 7.5 mm Number of attempts: 1 Airway Equipment and Method: Stylet and Video-laryngoscopy Placement Confirmation: ETT inserted through vocal cords under direct vision,  positive ETCO2 and breath sounds checked- equal and bilateral Secured at: 24 cm Tube secured with: Tape Dental Injury: Teeth and Oropharynx as per pre-operative assessment  Difficulty Due To: Difficulty was anticipated and Difficult Airway- due to anterior larynx Future Recommendations: Recommend- induction with short-acting agent, and alternative techniques readily available

## 2018-02-01 NOTE — Anesthesia Procedure Notes (Signed)
Procedure Name: Intubation Date/Time: 02/01/2018 3:47 PM Performed by: White, Amedeo Plenty, CRNA Pre-anesthesia Checklist: Patient identified, Emergency Drugs available, Suction available and Patient being monitored Patient Re-evaluated:Patient Re-evaluated prior to induction Oxygen Delivery Method: Circle System Utilized Preoxygenation: Pre-oxygenation with 100% oxygen Induction Type: IV induction Ventilation: Mask ventilation without difficulty and Oral airway inserted - appropriate to patient size Laryngoscope Size: Glidescope and 4 Grade View: Grade I Tube type: Oral Tube size: 7.5 mm Number of attempts: 1 Airway Equipment and Method: Stylet and Oral airway Placement Confirmation: ETT inserted through vocal cords under direct vision,  positive ETCO2 and breath sounds checked- equal and bilateral Secured at: 24 cm Tube secured with: Tape Dental Injury: Teeth and Oropharynx as per pre-operative assessment

## 2018-02-01 NOTE — Brief Op Note (Signed)
02/01/2018  10:07 AM  PATIENT:  Barry Curry  77 y.o. male  PRE-OPERATIVE DIAGNOSIS:  biopsy of pleural masses  POST-OPERATIVE DIAGNOSIS:  biopsy of pleural masses  PROCEDURE:  Procedure(s): VIDEO ASSISTED THORACOSCOPY (Right). Right muscle sparing thoracotomy PLEURAL BIOPSY (Right) DRAINAGE OF PLEURAL EFFUSION (Right)/Hemothorax TALC PLEURADESIS (Right)  SURGEON:  Surgeon(s) and Role:    * Felicie Kocher, Fernande Boyden, MD - Primary  PHYSICIAN ASSISTANT: Jadene Pierini, PA-c    ANESTHESIA:   general  EBL:  100 mL   BLOOD ADMINISTERED:none  DRAINS: one 13 F Chest Tube(s) in the right  pleural space   LOCAL MEDICATIONS USED:  NONE  SPECIMEN:  Source of Specimen:  right pleural fluid for cytology. biopsies of pleural implants for pathology.   Frozen section: spindle cell tumor.  DISPOSITION OF SPECIMEN:  PATHOLOGY  COUNTS:  YES  TOURNIQUET:  * No tourniquets in log *  DICTATION: .Note written in EPIC  PLAN OF CARE: Admit to inpatient   PATIENT DISPOSITION:  PACU - hemodynamically stable.   Delay start of Pharmacological VTE agent (>24hrs) due to surgical blood loss or risk of bleeding: yes

## 2018-02-01 NOTE — Procedures (Signed)
Extubation Procedure Note  Patient Details:   Name: JOSEMIGUEL GRIES DOB: [birth year] MRN: [id number]   Airway Documentation:  Airway 7.5 mm (Active)  Secured at (cm) 24 cm 02/01/2018 12:00 AM   Vent end date: 02/01/18 Vent end time: 1440   Evaluation  O2 sats: [place] after extubation  Complications: Complications of Patient was reintubated 1 hour after extubation. Patient did not tolerate procedure well. Bilateral Breath Sounds: Clear   Yes   Patient was extubated to a 4L  initially. Patient's SAT's began to decline & he was placed on 100% NRB. Patient began to have extreme anxiety immediately following extubation. Patient developed increased work of breathing & was eventually became unresponsive to sternal rub. ABG obtained by this RT & results were critical for Ph & CO2. Dr. Cyndia Bent was called by RN and made aware. Anesthesia was called to the bedside to reintubate the patient. Mechanics before extubation: NIF: -20, VC: 1.2L, positive cuff leak.   Tyquasia Pant, Eddie North 02/01/2018, 2:40 PM

## 2018-02-01 NOTE — Anesthesia Procedure Notes (Signed)
Arterial Line Insertion Start/End8/11/2017 7:00 AM Performed by: White, Amedeo Plenty, Immunologist, CRNA  Preanesthetic checklist: patient identified, IV checked, risks and benefits discussed, surgical consent, monitors and equipment checked and pre-op evaluation Lidocaine 1% used for infiltration Right, radial was placed Catheter size: 20 G Hand hygiene performed  and maximum sterile barriers used  Allen's test indicative of satisfactory collateral circulation Attempts: 2 Procedure performed without using ultrasound guided technique. Following insertion, dressing applied and Biopatch. Post procedure assessment: normal  Patient tolerated the procedure well with no immediate complications.

## 2018-02-01 NOTE — Progress Notes (Signed)
PROGRESS NOTE    Barry FERRARA  Curry:[id number] DOB: 03-26-1941 DOA: 01/21/2018 PCP: [name], PA-C    Brief Narrative:  Barry Curry a 77 y.o.malewith medical history significant ofhypertension, hyperlipidemia, GERD, depression, anxiety, atrial fibrillation not on anticoagulant, CAD, PVC, aortic stenosis, RBBB, iron deficiency anemia, CKD, clear renal cell cancer (left nephrectomy), melanoma, OSA who presents with shortness of breath.  3 weeks prior had a right thoracentesis at [facility] with 670 cc of bloody fluid removed.  At this admission on 01/22/2018 patient had a ultrasound-guided right thoracentesis with 2 L of bloody fluid removed.  Pleural fluid sent for Gram stain, culture, cytology.  Pulmonology consulted to assess for recurrence of pleural effusion.  Exudative pleural fluid.  Gram stain negative.  Cytology still pending. He underwent repeat thoracentesis on 7/31 for recurrent pleural effusion,  with cytology showing reactive mesothelial cells.     Assessment & Plan:   Principal Problem:   Recurrent right pleural effusion Active Problems:   Essential hypertension   GERD   Acute respiratory failure with hypoxia (HCC)   PAF (paroxysmal atrial fibrillation) (HCC)   Clear cell carcinoma of kidney, left (HCC)   Anxiety   CAD (coronary artery disease)   Iron deficiency anemia   Elevated troponin   CKD (chronic kidney disease), stage III (HCC)   Malnutrition of moderate degree   Acute respiratory failure with hypoxia secondary to recurrent pleural effusion, possibly malignant effusion  - IR guided thoracentesis 4 times in the last one month. - exudative, gram stain negative, culture negative and cytology showing reactive mesothelial cells  - [place] oxygen to keep sats greater than 90%. Currently on 2 lit of [place] oxygen.    He is afebrile and has persistent leukocytosis.  Pro calcitonin level is negative, indicative of non infective process but in view  of the consolidative process and persistent leukocytosis and reactive mesothelial cell on cytology, and after discussing with Dr Tommy [name] with ID, will plan for 5 days of IV cefepime for possible HCAP. Please d/c cefepime after today's dose.  Requested cardio thoracic surgery for  VATS with pleural biopsy, .  Today he underwent VATS on the right with right pleural biopsy, drainage of pleural effusion and talc pleurodesis. He was transferred to ICU post op and intubated.    Paroxysmal atrial fibrillation:  Rate controlled. On metoprolol and dronedarone.   Iron deficiency anemia/ acute blood loss anemia:  IV iron and aranesp ordered by oncology.  He underwent 2 units of prbc transfusion and his repeat hemoglobin is at 10.9.  No changes in medications.   Hypertension:  Well controlled.   CAD: no chest pain or sob. Resume BB.   GERD stable.   Mild  Acute on Stage 3 CKD.  Creatinine back to baseline today.   Leukocytosis: suspect reactive. Pro calcitonin is negative.  Afebrile, no dysuria symptoms. Urine culture report grew 20,000 pseudomonas.  Blood cultures have been negative so far.  Pleural fluid culture negative.  Cytology from pleural fluid analysis shows reactive mesothelial cells.     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  Dr Marin Olp on board.  Follow up with PET/CT scan for biopsy of the adrenal nodules and pulmonary nodules.    Moderate Protein Calorie malnutrition:  - nutrition consulted.  Supplementation added.     DVT prophylaxis: SCD'S Code Status: full code.  Family Communication: none at bedside.  Disposition Plan: pending clinical improvement  Consultants:   Oncology  Tcts.   Procedures: IR thoracentesis on 7/31, 8/3 VATS, with pleural biopsy, talc pleurodesis.  Antimicrobials: cefepime since 8/2 for 5 days. D/c after today's dose.   Subjective: Intubated.   Objective: Vitals:   02/01/18 0724  02/01/18 0725 02/01/18 0726 02/01/18 0727  BP:      Pulse: (!) 103 (!) 102 (!) 101 (!) 101  Resp:      Temp:      TempSrc:      SpO2: 97% 98% 98% 98%  Weight:      Height:        Intake/Output Summary (Last 24 hours) at 02/01/2018 1052 Last data filed at 02/01/2018 0935 Gross per 24 hour  Intake 250 ml  Output 300 ml  Net -50 ml   Filed Weights   01/21/18 1107  Weight: 92.1 kg (203 lb)    Examination:  General exam: not in distress. Pt intubated.  Respiratory system: air entry fair.  Cardiovascular system: S1 & S2 heard,irregular, murmer present.  Gastrointestinal system: Abdomen is soft , bowel sounds good.  Central nervous system: sedated.  Extremities: no cyanosis or clubbing. No pedal edema.  Skin: No rashes, lesions or ulcers Psychiatry: cannot be assessed.     Data Reviewed: I have personally reviewed following labs and imaging studies  CBC: Recent Labs  Lab 01/27/18 0224 01/28/18 0310 01/29/18 0215 01/30/18 0203 01/31/18 0255  WBC 14.6* 16.9* 18.8* 16.8* 14.0*  NEUTROABS 11.9* 13.6* 15.3* 13.8* 11.7*  HGB 8.9* 8.8* 10.9* 10.6* 10.8*  HCT 27.9* 27.8* 34.1* 32.6* 34.0*  MCV 84.5 84.8 86.1 85.3 85.2  PLT 217 207 205 216 818   Basic Metabolic Panel: Recent Labs  Lab 01/27/18 0224 01/28/18 0310 01/30/18 0203 01/31/18 0255 02/01/18 0302  NA 137 138 137 138 136  K 4.2 4.2 3.8 4.0 4.3  CL 107 106 103 107 102  CO2 25 25 25 24 23   GLUCOSE 92 100* 95 91 85  BUN 22 25* 29* 24* 20  CREATININE 1.29* 1.48* 1.60* 1.47* 1.49*  CALCIUM 8.5* 8.3* 8.0* 8.1* 8.2*  MG 1.7  --   --   --   --    GFR: Estimated Creatinine Clearance: 45.6 mL/min (A) (by C-G formula based on SCr of 1.49 mg/dL (H)). Liver Function Tests: Recent Labs  Lab 01/27/18 0224 01/28/18 0310 01/30/18 0203 01/31/18 0255 02/01/18 0302  AST 21 21 17 21 25   ALT 15 15 14 16 19   ALKPHOS 65 71 69 70 80  BILITOT 0.5 0.5 0.8 0.8 0.9  PROT 4.9* 5.0* 4.9* 5.0* 5.6*  ALBUMIN 2.4* 2.4* 2.1* 2.1*  2.3*   No results for input(s): LIPASE, AMYLASE in the last 168 hours. No results for input(s): AMMONIA in the last 168 hours. Coagulation Profile: No results for input(s): INR, PROTIME in the last 168 hours. Cardiac Enzymes: No results for input(s): CKTOTAL, CKMB, CKMBINDEX, TROPONINI in the last 168 hours. BNP (last 3 results) No results for input(s): PROBNP in the last 8760 hours. HbA1C: No results for input(s): HGBA1C in the last 72 hours. CBG: Recent Labs  Lab 01/26/18 1937 01/28/18 1702 01/31/18 2252  GLUCAP 128* 128* 106*   Lipid Profile: No results for input(s): CHOL, HDL, LDLCALC, TRIG, CHOLHDL, LDLDIRECT in the last 72 hours. Thyroid Function Tests: No results for input(s): TSH, T4TOTAL, FREET4, T3FREE, THYROIDAB in the last 72 hours. Anemia Panel: No results for input(s): VITAMINB12, FOLATE, FERRITIN, TIBC, IRON, RETICCTPCT in the last 72 hours. Sepsis  Labs: Recent Labs  Lab 01/26/18 0650 01/26/18 0935 01/28/18 0310  PROCALCITON <0.10  --  0.10  LATICACIDVEN 1.6 1.8  --     Recent Results (from the past 240 hour(s))  Culture, body fluid-bottle     Status: None   Collection Time: 01/22/18  2:59 PM  Result Value Ref Range Status   Specimen Description PLEURAL RIGHT  Final   Special Requests NONE  Final   Culture   Final    NO GROWTH 5 DAYS Performed at [facility], [address], [place], [place] [address]    Report Status 01/27/2018 FINAL  Final  Gram stain     Status: None   Collection Time: 01/22/18  2:59 PM  Result Value Ref Range Status   Specimen Description PLEURAL RIGHT  Final   Special Requests NONE  Final   Gram Stain   Final    FEW WBC PRESENT, PREDOMINANTLY MONONUCLEAR NO ORGANISMS SEEN Performed at [facility], [place] [address] [place], [place] [address]    Report Status 01/22/2018 FINAL  Final         Radiology Studies: No results found.      Scheduled Meds: . dronedarone  400 mg Oral BID WC  . feeding  supplement (ENSURE ENLIVE)  237 mL Oral BID BM  . ferrous sulfate  325 mg Oral Q breakfast  . fluticasone  2 spray Each Nare QHS  . folic acid  2 mg Oral Daily  . levalbuterol  1.25 mg Nebulization BID  . lipase/protease/amylase  72,000 Units Oral BID AC  . mouth rinse  15 mL Mouth Rinse BID  . megestrol  400 mg Oral BID  . metoprolol succinate  25 mg Oral Daily  . morphine   Intravenous Q4H  . pantoprazole  40 mg Oral Daily  . polyethylene glycol  17 g Oral Daily  . senna-docusate  2 tablet Oral BID  . tamsulosin  0.4 mg Oral QPC supper  . vitamin B-6  25 mg Oral BID   Continuous Infusions: . ceFEPime (MAXIPIME) IV 0 g (01/31/18 1733)  . levofloxacin (LEVAQUIN) IV       LOS: 10 days    Time spent: 35 minutes.     Hosie Poisson, MD [facility] Pager [phone]  If 7PM-7AM, please contact night-coverage [email or web address] Password [email or web address] 02/01/2018, 10:52 AM

## 2018-02-01 NOTE — Progress Notes (Signed)
Patient ID: Barry Curry, male   DOB: 1941-05-11, 77 y.o.   MRN: [id number] TCTS Evening Rounds:  He was extubated this afternoon but was very anxious and tachypneic. He was put on low dose Precedex for pain control but became obtunded and ABG showed rise in PCO2 to 104. He was intubated by anesthesia without incident. He is hemodynamically stable on low dose neo this evening. Sedated on Precedex on vent.  CXR looks stable. CT output low.  CBC    Component Value Date/Time   WBC 25.5 (H) 02/01/2018 1115   RBC 3.46 (L) 02/01/2018 1115   HGB 9.7 (L) 02/01/2018 1115   HGB 10.5 (L) 01/12/2018 1504   HCT 30.7 (L) 02/01/2018 1115   PLT 256 02/01/2018 1115   PLT 253 01/12/2018 1504   MCV 88.7 02/01/2018 1115   MCH 28.0 02/01/2018 1115   MCHC 31.6 02/01/2018 1115   RDW 19.1 (H) 02/01/2018 1115   LYMPHSABS 0.6 (L) 01/31/2018 0255   MONOABS 1.3 (H) 01/31/2018 0255   EOSABS 0.1 01/31/2018 0255   BASOSABS 0.0 01/31/2018 0255   BMET    Component Value Date/Time   NA 138 02/01/2018 1151   K 4.4 02/01/2018 1151   CL 108 02/01/2018 1151   CO2 21 (L) 02/01/2018 1151   GLUCOSE 152 (H) 02/01/2018 1151   BUN 21 02/01/2018 1151   CREATININE 1.33 (H) 02/01/2018 1151   CREATININE 1.60 (H) 01/12/2018 1504   CALCIUM 7.7 (L) 02/01/2018 1151   GFRNONAA 50 (L) 02/01/2018 1151   GFRAA 58 (L) 02/01/2018 1151   Will plan to keep on vent overnight and wean to extubate in am. Discussed status and plans with daughter at bedside.

## 2018-02-01 NOTE — Plan of Care (Signed)
Discussed plan of care with patient.  Focused on pain management and using the call button if assistance is needed.

## 2018-02-01 NOTE — Transfer of Care (Signed)
Immediate Anesthesia Transfer of Care Note  Patient: ALESSANDRO GRIEP  Procedure(s) Performed: VIDEO ASSISTED THORACOSCOPY (Right ) PLEURAL BIOPSY (Right ) DRAINAGE OF PLEURAL EFFUSION (Right ) TALC PLEURADESIS (Right )  Patient Location: SICU  Anesthesia Type:General  Level of Consciousness: sedated and Patient remains intubated per anesthesia plan  Airway & Oxygen Therapy: Patient remains intubated per anesthesia plan and Patient placed on Ventilator (see vital sign flow sheet for setting)  Post-op Assessment: Report given to RN and Post -op Vital signs reviewed and stable  Post vital signs: Reviewed and stable  Last Vitals:  Vitals Value Taken Time  BP 132/58 02/01/2018 10:49 AM  Temp    Pulse    Resp 15 02/01/2018 10:52 AM  SpO2    Vitals shown include unvalidated device data.  Last Pain:  Vitals:   02/01/18 0400  TempSrc:   PainSc: Asleep      Patients Stated Pain Goal: 0 (56/86/16 8372)  Complications: No apparent anesthesia complications

## 2018-02-01 NOTE — Progress Notes (Addendum)
Pt extubated WNL parameters per Dr. Cyndia Bent. Shortly after he became very tachypeic and anxious, requiring an increased in 02. Placed on non-rebreather  For mouth breathing, he was restless and anxious the entire time. Started low dose precedex per [place]le, morphine PCA initiated w/o continuous dose, only PCA & the pt did not press button. Soon became hard to arouse, obtunded, and his ABG showed severe respiratory acidosis. Anesthesia notified and proceeded with intubation.

## 2018-02-01 NOTE — Progress Notes (Signed)
PT Cancellation Note  Patient Details Name: Barry Curry MRN: [id number] DOB: 1941/06/01   Cancelled Treatment:    Reason Eval/Treat Not Completed: Patient at procedure or test/unavailable. Will follow-up for PT treatment as schedule permits.  Mabeline Caras, PT, DPT Acute Rehab Services  Pager: [place] 02/01/2018, 7:35 AM

## 2018-02-01 NOTE — Anesthesia Postprocedure Evaluation (Signed)
Anesthesia Post Note  Patient: Barry Curry  Procedure(s) Performed: VIDEO ASSISTED THORACOSCOPY (Right ) PLEURAL BIOPSY (Right ) DRAINAGE OF PLEURAL EFFUSION (Right ) TALC PLEURADESIS (Right )     Patient location during evaluation: ICU Anesthesia Type: General Level of consciousness: sedated and patient remains intubated per anesthesia plan Pain management: pain level controlled Vital Signs Assessment: post-procedure vital signs reviewed and stable Respiratory status: patient re-intubated and patient on ventilator - see flowsheet for VS Cardiovascular status: stable (On vasopressors) Postop Assessment: no apparent nausea or vomiting Anesthetic complications: no Comments: Patient extubated at end of procedure, but was dyspneic and tachypneic following extubation. Patient complaining he was short of breath. Also requiring high doses of phenylephrine to maintain adequate MAP. Decision made to reintubate patient prior to leaving the operating room, brought to ICU for weaning and further optimization.    Last Vitals:  Vitals:   02/01/18 1123 02/01/18 1200  BP:  (!) 117/58  Pulse:  (!) 116  Resp:  12  Temp: 36.7 C   SpO2:  99%    Last Pain:  Vitals:   02/01/18 1123  TempSrc: Oral  PainSc:                  [name]

## 2018-02-01 NOTE — Progress Notes (Signed)
23 cc's of morphine (including PCA tubing) wasted after discontinuation of PCA. Witness: Hiram Gash, RN.

## 2018-02-02 ENCOUNTER — Inpatient Hospital Stay (HOSPITAL_COMMUNITY): Payer: Medicare Other

## 2018-02-02 ENCOUNTER — Encounter (HOSPITAL_COMMUNITY): Payer: Self-pay | Admitting: Surgery

## 2018-02-02 ENCOUNTER — Inpatient Hospital Stay: Payer: Self-pay

## 2018-02-02 DIAGNOSIS — E440 Moderate protein-calorie malnutrition: Secondary | ICD-10-CM

## 2018-02-02 DIAGNOSIS — N189 Chronic kidney disease, unspecified: Secondary | ICD-10-CM

## 2018-02-02 DIAGNOSIS — Z9889 Other specified postprocedural states: Secondary | ICD-10-CM

## 2018-02-02 DIAGNOSIS — N179 Acute kidney failure, unspecified: Secondary | ICD-10-CM

## 2018-02-02 LAB — POCT I-STAT 3, ART BLOOD GAS (G3+)
ACID-BASE DEFICIT: 5 mmol/L — AB (ref 0.0–2.0)
Acid-base deficit: 3 mmol/L — ABNORMAL HIGH (ref 0.0–2.0)
Bicarbonate: 20.1 mmol/L (ref 20.0–28.0)
Bicarbonate: 18.8 mmol/L — ABNORMAL LOW (ref 20.0–28.0)
O2 Saturation: 99 %
O2 Saturation: 99 %
Patient temperature: 99.6
Patient temperature: 98.7
TCO2: 21 mmol/L — ABNORMAL LOW (ref 22–32)
TCO2: 20 mmol/L — AB (ref 22–32)
pCO2 arterial: 27.5 mmHg — ABNORMAL LOW (ref 32.0–48.0)
pCO2 arterial: 30.4 mmHg — ABNORMAL LOW (ref 32.0–48.0)
pH, Arterial: 7.474 — ABNORMAL HIGH (ref 7.350–7.450)
pH, Arterial: 7.4 (ref 7.350–7.450)
pO2, Arterial: 121 mmHg — ABNORMAL HIGH (ref 83.0–108.0)
pO2, Arterial: 143 mmHg — ABNORMAL HIGH (ref 83.0–108.0)

## 2018-02-02 LAB — CBC
HEMATOCRIT: 28.4 % — AB (ref 39.0–52.0)
HEMOGLOBIN: 9.1 g/dL — AB (ref 13.0–17.0)
MCH: 27.7 pg (ref 26.0–34.0)
MCHC: 32 g/dL (ref 30.0–36.0)
MCV: 86.3 fL (ref 78.0–100.0)
Platelets: 246 10*3/uL (ref 150–400)
RBC: 3.29 MIL/uL — ABNORMAL LOW (ref 4.22–5.81)
RDW: 19.1 % — AB (ref 11.5–15.5)
WBC: 24.9 10*3/uL — AB (ref 4.0–10.5)

## 2018-02-02 LAB — BASIC METABOLIC PANEL
ANION GAP: 12 (ref 5–15)
BUN: 22 mg/dL (ref 8–23)
CHLORIDE: 106 mmol/L (ref 98–111)
CO2: 20 mmol/L — AB (ref 22–32)
Calcium: 7.9 mg/dL — ABNORMAL LOW (ref 8.9–10.3)
Creatinine, Ser: 1.17 mg/dL (ref 0.61–1.24)
GFR calc Af Amer: >60 mL/min (ref >60)
GFR calc non Af Amer: 58 mL/min — ABNORMAL LOW (ref >60)
GLUCOSE: 125 mg/dL — AB (ref 70–99)
Potassium: 4.6 mmol/L (ref 3.5–5.1)
Sodium: 138 mmol/L (ref 135–145)

## 2018-02-02 LAB — GLUCOSE, CAPILLARY
Glucose-Capillary: 111 mg/dL — ABNORMAL HIGH (ref 70–99)
Glucose-Capillary: 136 mg/dL — ABNORMAL HIGH (ref 70–99)
Glucose-Capillary: 115 mg/dL — ABNORMAL HIGH (ref 70–99)

## 2018-02-02 MED ORDER — CHLORHEXIDINE GLUCONATE 0.12 % MT SOLN
OROMUCOSAL | Status: AC
  Filled 2018-02-02: qty 15

## 2018-02-02 MED ORDER — ACETAMINOPHEN 10 MG/ML IV SOLN
1000.0000 mg | Freq: Four times a day (QID) | INTRAVENOUS | Status: AC
  Administered 2018-02-02 – 2018-02-03 (×4): 1000 mg via INTRAVENOUS
  Filled 2018-02-02 (×4): qty 100

## 2018-02-02 MED ORDER — SODIUM CHLORIDE 0.9% FLUSH
10.0000 mL | INTRAVENOUS | Status: DC | PRN
  Administered 2018-02-02: 10 mL
  Filled 2018-02-02: qty 40

## 2018-02-02 MED ORDER — CHLORHEXIDINE GLUCONATE CLOTH 2 % EX PADS
6.0000 | MEDICATED_PAD | Freq: Every day | CUTANEOUS | Status: DC
  Administered 2018-02-02 – 2018-02-06 (×5): 6 via TOPICAL

## 2018-02-02 MED ORDER — FAMOTIDINE IN NACL 20-0.9 MG/50ML-% IV SOLN
20.0000 mg | Freq: Two times a day (BID) | INTRAVENOUS | Status: DC
  Administered 2018-02-02 (×2): 20 mg via INTRAVENOUS
  Filled 2018-02-02 (×2): qty 50

## 2018-02-02 MED ORDER — SODIUM CHLORIDE 0.9% FLUSH
10.0000 mL | Freq: Two times a day (BID) | INTRAVENOUS | Status: DC
  Administered 2018-02-03 – 2018-02-07 (×7): 10 mL

## 2018-02-02 MED ORDER — SODIUM CHLORIDE 0.9 % IV SOLN
INTRAVENOUS | Status: DC
  Administered 2018-02-02: 18:00:00 via INTRAVENOUS

## 2018-02-02 MED ORDER — ORAL CARE MOUTH RINSE
15.0000 mL | Freq: Two times a day (BID) | OROMUCOSAL | Status: DC
  Administered 2018-02-02 – 2018-02-04 (×3): 15 mL via OROMUCOSAL

## 2018-02-02 MED ORDER — ENSURE ENLIVE PO LIQD
237.0000 mL | Freq: Three times a day (TID) | ORAL | Status: DC
  Administered 2018-02-02 – 2018-02-06 (×11): 237 mL via ORAL

## 2018-02-02 NOTE — Progress Notes (Signed)
RT note: RT instructed patient with use of Incentive Spirometer post extubation. Patient had 1250 times 5 with good effort.

## 2018-02-02 NOTE — Progress Notes (Signed)
1 Day Post-Op Procedure(s) (LRB): VIDEO ASSISTED THORACOSCOPY (Right) PLEURAL BIOPSY (Right) DRAINAGE OF PLEURAL EFFUSION (Right) TALC PLEURADESIS (Right) Subjective: Intubated on Precedex but responds  Objective: Vital signs in last 24 hours: Temp:  [98.1 F (36.7 C)-99.6 F (37.6 C)] 99.6 F (37.6 C) (08/07 0400) Pulse Rate:  [41-124] 73 (08/07 0702) Cardiac Rhythm: Normal sinus rhythm;Bundle branch block;Ventricular paced (08/07 0400) Resp:  [12-32] 20 (08/07 0702) BP: (75-131)/(49-99) 91/58 (08/07 0702) SpO2:  [97 %-100 %] 99 % (08/07 0702) FiO2 (%):  [30 %-100 %] 30 % (08/07 0446)  Hemodynamic parameters for last 24 hours:    Intake/Output from previous day: 08/06 0701 - 08/07 0700 In: 7946.5 [P.O.:1800; I.V.:5366.5; NG/GT:280; IV Piggyback:500] Out: 5102 [Urine:926; Emesis/NG output:200; Blood:100; Chest HENI:778] Intake/Output this shift: No intake/output data recorded.  General appearance: alert and cooperative Neurologic: intact Heart: regular rate and rhythm, S1, S2 normal, no murmur, click, rub or gallop Lungs: clear to auscultation bilaterally Wound: some serosanguinous drainage on dressings. chest tube output minimal. No air leak  Lab Results: Recent Labs    02/01/18 1115 02/02/18 0417  WBC 25.5* 24.9*  HGB 9.7* 9.1*  HCT 30.7* 28.4*  PLT 256 246   BMET:  Recent Labs    02/01/18 1151 02/02/18 0417  NA 138 138  K 4.4 4.6  CL 108 106  CO2 21* 20*  GLUCOSE 152* 125*  BUN 21 22  CREATININE 1.33* 1.17  CALCIUM 7.7* 7.9*    PT/INR: No results for input(s): LABPROT, INR in the last 72 hours. ABG    Component Value Date/Time   PHART 7.474 (H) 02/02/2018 0424   HCO3 20.1 02/02/2018 0424   TCO2 21 (L) 02/02/2018 0424   ACIDBASEDEF 3.0 (H) 02/02/2018 0424   O2SAT 99.0 02/02/2018 0424   CBG (last 3)  Recent Labs    02/01/18 1606 02/01/18 1928 02/01/18 2311  GLUCAP 205* 179* 131*   CXR: stable. Some haziness on right probably due to talc  and pleural reaction. May be small basilar ptx. Some subcutaneous air present.  Assessment/Plan: S/P Procedure(s) (LRB): VIDEO ASSISTED THORACOSCOPY (Right) PLEURAL BIOPSY (Right) DRAINAGE OF PLEURAL EFFUSION (Right) TALC PLEURADESIS (Right)  Hemodynamically stable on low dose neo and precedex. Will wean off precedex and wean vent to extubate this am. He seems very sensitive to narcotics so will use IV tylenol for pain for 24 hrs and stop all narcotics and sedating meds. Keep NPO. Use bipap as needed.  Continue chest tube to suction.  Discussed status and plans with family at bedside.   LOS: 11 days    Gaye Pollack 02/02/2018

## 2018-02-02 NOTE — Progress Notes (Signed)
Nutrition Follow-up  DOCUMENTATION CODES:   Non-severe (moderate) malnutrition in context of chronic illness  INTERVENTION:   Ensure Enlive po TID, each supplement provides 350 kcal and 20 grams of protein  NUTRITION DIAGNOSIS:   Moderate Malnutrition related to chronic illness(CKD, CAD, likely cancer recurrence) as evidenced by mild muscle depletion, mild fat depletion.  Ongoing  GOAL:   Patient will meet greater than or equal to 90% of their needs  Not meeting- awaiting diet advancement  MONITOR:   PO intake, Supplement acceptance, Labs, Weight trends  REASON FOR ASSESSMENT:   Consult Assessment of nutrition requirement/status  ASSESSMENT:   77 y/o male PMHx HTN/HLD, GERD, Depression, Anxiety, Afib, CAD, PVC, AS, RBB, IDA, CKD, RCC S/p L nephrectomy. Presents with SOB. Work up revealed R pleural effusion and new R mammary lymphadenopathy, pulm nodules, R adrenal nodules suspicious for metastatic diease. Admitted for thoracentsis and management of resp failure.    8/6- intubated after VATS, attempted same day extubated but had to be re-intubated 1 hr later 8/7- pt extubated  Spoke with daughter at bedside. Daughter reports prior to being intubated on 8/6 pt's appetite was surprisingly progressing. He ate an egg/saugage biscuit, pot roast, and drank two Ensures. Daughter reports even when he doesn't want to eat he will still drink Ensure. Suggested we increase these to TID to maximize calories and protein. Daughter amendable.   Pt is currently NPO s/p extubation. Discussed cortrak briefly but daughter would like to how he does once diet is advanced.   A recent wt has not been obtained since RD assessment on 7/27. Will need to obtain new weight to monitor trends.   Medications reviewed and include: ferrous sulfate, folic acid, creon BID, megace BID Labs reviewed.   Diet Order:   Diet Order           Diet NPO time specified  Diet effective now          EDUCATION  NEEDS:   Education needs have been addressed  Skin:  Skin Assessment: Reviewed RN Assessment  Last BM:  01/31/18  Height:   Ht Readings from Last 1 Encounters:  01/21/18 6' (1.829 m)    Weight:   Wt Readings from Last 1 Encounters:  01/21/18 203 lb (92.1 kg)    Ideal Body Weight:  80.91 kg  BMI:  Body mass index is 27.53 kg/m.  Estimated Nutritional Needs:   Kcal:  2100-2300 kcal  Protein:  105-120 grams  Fluid:  >/= 2.1 L/day    Mariana Single RD, LDN Clinical Nutrition Pager # - [phone]

## 2018-02-02 NOTE — Progress Notes (Signed)
Patient tolerated sips of clear liquids w/o difficulty or cough.  Patient ate 1/2 slice of cake brought in by family w/o difficulty.  NAD noted.  Diet advanced to cardiac per Dr. Cyndia Bent.

## 2018-02-02 NOTE — Progress Notes (Signed)
Patient ID: Barry Curry, male   DOB: 1941/04/18, 77 y.o.   MRN: [id number] EVENING ROUNDS NOTE :     [address]       [place] [address]             [phone]                 1 Day Post-Op Procedure(s) (LRB): VIDEO ASSISTED THORACOSCOPY (Right) PLEURAL BIOPSY (Right) DRAINAGE OF PLEURAL EFFUSION (Right) TALC PLEURADESIS (Right)  Total Length of Stay:  LOS: 11 days  BP 127/87 (BP Location: Right Arm)   Pulse 93   Temp 98.7 F (37.1 C) (Oral)   Resp 20   Ht 6' (1.829 m)   Wt 203 lb (92.1 kg)   SpO2 100%   BMI 27.53 kg/m   .Intake/Output      08/06 0701 - 08/07 0700 08/07 0701 - 08/08 0700   P.O. 1800    I.V. (mL/kg) 5396.4 (58.6) 1203.5 (13.1)   NG/GT 280 360   IV Piggyback 500 150   Total Intake(mL/kg) 7976.4 (86.6) 1713.5 (18.6)   Urine (mL/kg/hr) 926 (0.4) 315 (0.3)   Emesis/NG output 200    Blood 100    Chest Tube 545 135   Total Output 1771 450   Net +6205.4 +1263.5          . acetaminophen Stopped (02/02/18 1150)  . dextrose 5 % and 0.9% NaCl 50 mL/hr at 02/02/18 1700  . famotidine (PEPCID) IV Stopped (02/02/18 0831)  . phenylephrine (NEO-SYNEPHRINE) Adult infusion 20 mcg/min (02/02/18 1700)  . potassium chloride       Lab Results  Component Value Date   WBC 24.9 (H) 02/02/2018   HGB 9.1 (L) 02/02/2018   HCT 28.4 (L) 02/02/2018   PLT 246 02/02/2018   GLUCOSE 125 (H) 02/02/2018   CHOL 131 01/22/2018   TRIG 116 01/22/2018   HDL 31 (L) 01/22/2018   LDLCALC 77 01/22/2018   ALT 19 02/01/2018   AST 25 02/01/2018   NA 138 02/02/2018   K 4.6 02/02/2018   CL 106 02/02/2018   CREATININE 1.17 02/02/2018   BUN 22 02/02/2018   CO2 20 (L) 02/02/2018   INR 1.08 01/21/2018   HGBA1C 5.4 01/22/2018   Now extubated  Chest tube in place    Grace Isaac MD  Beeper [phone] Office [phone] 02/02/2018 5:21 PM

## 2018-02-02 NOTE — Progress Notes (Signed)
Pt. With vasopressors in Lt wrist PIV  Painful with flushing.  Assessed both arms with ultrasound.  Finding no suitable veins.

## 2018-02-02 NOTE — Progress Notes (Signed)
1915 Bedside shift report, pt A&Ox4, visiting with dgt. NAD, no complaints. CT to right lateral draining, dressing changed. Pt assessed, see flow sheet. Updated pt and dgt with POC. Fall precautions in place, [facility].

## 2018-02-02 NOTE — Progress Notes (Signed)
Peripherally Inserted Central Catheter/Midline Placement  The IV Nurse has discussed with the patient and/or persons authorized to consent for the patient, the purpose of this procedure and the potential benefits and risks involved with this procedure.  The benefits include less needle sticks, lab draws from the catheter, and the patient may be discharged home with the catheter. Risks include, but not limited to, infection, bleeding, blood clot (thrombus formation), and puncture of an artery; nerve damage and irregular heartbeat and possibility to perform a PICC exchange if needed/ordered by physician.  Alternatives to this procedure were also discussed.  Bard Power PICC patient education guide, fact sheet on infection prevention and patient information card has been provided to patient /or left at bedside.    PICC/Midline Placement Documentation  PICC Double Lumen 50/09/38 PICC Right Basilic 42 cm 0 cm (Active)       Barry Curry 02/02/2018, 4:53 PM

## 2018-02-02 NOTE — Progress Notes (Addendum)
PROGRESS NOTE    Barry Curry  YJE:[id number] DOB: 1941-05-08 DOA: 01/21/2018 PCP: [name], PA-C    Brief Narrative:  EDU ON a 77 y.o.malewith medical history significant ofhypertension, hyperlipidemia, GERD, depression, anxiety, atrial fibrillation not on anticoagulant, CAD, PVC, aortic stenosis, RBBB, iron deficiency anemia, CKD, clear renal cell cancer (left nephrectomy), melanoma, OSA who presented with shortness of breath. 3 weeks prior patient had a right thoracentesis at [facility] with 670 cc of bloody fluid removed. During this admission on 01/22/2018 patient had a ultrasound-guided right thoracentesis with 2 L of bloody fluid removed. Pleural fluid sent for Gram stain, culture, cytology. Pulmonology consulted to assess for recurrence of pleural effusion. Exudative pleural fluid noted. He underwent repeat thoracentesis on 7/31 for recurrent pleural effusion, with cytology showing reactive mesothelial cells.  TCTS consulted and patient underwent right video-assisted thoracoscopy, biopsy of pleural masses and talc pleurodesis 8/6.  He was extubated postprocedure but then became very anxious and tachypneic for which she was put on low-dose Precedex for pain control but became obtunded and hypercapnic for which she was reintubated and admitted to ICU.  Extubated 8/7.  Pleural fluid 8/3 shows atypical cells.   Assessment & Plan:   Principal Problem:   Recurrent right pleural effusion Active Problems:   Essential hypertension   GERD   Acute respiratory failure with hypoxia (HCC)   PAF (paroxysmal atrial fibrillation) (HCC)   Clear cell carcinoma of kidney, left (HCC)   Anxiety   CAD (coronary artery disease)   Iron deficiency anemia   Elevated troponin   CKD (chronic kidney disease), stage III (HCC)   Malnutrition of moderate degree   S/P thoracotomy   Acute respiratory failure with hypoxia secondary to recurrent pleural effusion, possibly  malignant effusion  - IR guided thoracentesis 4 times in the last one month. - exudative, gram stain negative, culture negative and cytology showing reactive mesothelial cells on 7/31 but atypical cells on 8/3. -Post VATS required reintubation on 8/6 and admission to ICU.  Since this morning, patient has been extubated.  Avoid narcotics.  Continue n.p.o. for now. - He is afebrile and has persistent leukocytosis.  - Pro calcitonin level is negative, indicative of non infective process but in view of the consolidative process and persistent leukocytosis and reactive mesothelial cell on cytology, as per discussion with ID, completed 5 days of IV cefepime for possible HCAP.  Pleural culture results from 7/27: Negative. Requested cardio thoracic surgery for  VATS with pleural biopsy, .  TCTS consulted and patient underwent right video-assisted thoracoscopy, biopsy of pleural masses and talc pleurodesis 8/6.  Pathology results pending.  Right chest tube in place. -Chest x-ray 8/7 personally reviewed.  Small right basal pneumothorax.   - Discussed with Dr. Cyndia Bent.  Paroxysmal atrial fibrillation:  Rate controlled. On dronedarone.  Metoprolol currently on hold.  Likely not anticoagulation candidate due to hemorrhagic pleural effusion.  Iron deficiency anemia/ acute blood loss anemia:  IV iron and aranesp ordered by oncology.  He underwent 2 units of prbc transfusion and his hemoglobin is stable in the 9 g range.  Essential hypertension:  Well controlled.   CAD:  no chest pain or sob. Resume BB.   GERD  stable.   Mild Acute on Stage 3 CKD.  Creatinine has normalized to 1.17 on 8/7.  Leukocytosis:  - suspect reactive. Pro calcitonin is negative.  Afebrile, no dysuria symptoms. Urine culture report grew 20,000 pseudomonas.  Blood cultures negative, final report. Pleural  fluid culture negative, final report.  Cytology from pleural fluid analysis shows reactive mesothelial cells on initial  testing 7/31 and atypical cells on 8/3.   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 Marin Olp does not think that this is metastatic renal cell carcinoma and more concerned about some kind of sarcoma or possibly mesothelioma.  Pathology may take several days to come back. Follow up with PET/CT scan for biopsy of the adrenal nodules and pulmonary nodules.   Moderate Protein Calorie malnutrition:  - nutrition consultation and input appreciated.  Continue nutritional supplements.   DVT prophylaxis: SCD'S Code Status: full code.  Family Communication: Discussed in detail with patient's daughter and sister at bedside.  Updated care and answered questions. Disposition Plan: Patient was transferred to ICU post procedure on 8/6.  Continue close monitoring and management in ICU for additional 24 hours.   Consultants:   Oncology  Tcts.   Procedures:  IR thoracentesis on 7/31,  8/6 VATS, with pleural biopsy, talc pleurodesis.  Right chest tube. Foley catheter. Oral ET tube-discontinued 8/7.  Antimicrobials: cefepime since 8/2 >8/6  Subjective: Intubated.  Seemed awake and alert this morning.  Nods appropriately to questions.  Denies dyspnea or pain.  Family and nursing at bedside.  Objective: Vitals:   02/02/18 0945 02/02/18 1000 02/02/18 1100 02/02/18 1130  BP:  (!) 122/59 (!) 130/56   Pulse: 94 95 80 70  Resp: (!) 25 (!) 25 19 (!) 24  Temp:    98.2 F (36.8 C)  TempSrc:    Oral  SpO2: 100% 100% 100% 100%  Weight:      Height:        Intake/Output Summary (Last 24 hours) at 02/02/2018 1220 Last data filed at 02/02/2018 1200 Gross per 24 hour  Intake 6764.25 ml  Output 1406 ml  Net 5358.25 ml   Filed Weights   01/21/18 1107  Weight: 92.1 kg (203 lb)    Examination:  General exam: Pleasant elderly male, moderately built and  nourished lying comfortably propped up in bed. Respiratory system: Oral ET tube and on ventilator this morning.  Harsh and slightly diminished breath sounds in right lung fields.  Right thoracotomy tube draining bloodstained fluid.  Left lungs clear to auscultation.  No increased work of breathing. Cardiovascular system: S1 and S2 heard, RRR.  No JVD or murmurs.  Trace bilateral upper extremity edema but no lower extremity edema.  Telemetry personally reviewed: Mostly SR, occasional on demand atrial pacing, occasional PVCs.  BBB morphology. Gastrointestinal system: Abdomen is nondistended, soft and nontender.  Normal bowel sounds heard.  No organomegaly or masses appreciated. Central nervous system: Alert and seems oriented.  No focal neurological deficits. Extremities: Moves all extremities symmetrically. Skin: No rashes, lesions or ulcers Psychiatry: cannot be assessed.     Data Reviewed: I have personally reviewed following labs and imaging studies  CBC: Recent Labs  Lab 01/27/18 0224 01/28/18 0310 01/29/18 0215 01/30/18 0203 01/31/18 0255 02/01/18 0849 02/01/18 1115 02/02/18 0417  WBC 14.6* 16.9* 18.8* 16.8* 14.0*  --  25.5* 24.9*  NEUTROABS 11.9* 13.6* 15.3* 13.8* 11.7*  --   --   --   HGB 8.9* 8.8* 10.9* 10.6* 10.8* 10.5* 9.7* 9.1*  HCT 27.9* 27.8* 34.1* 32.6* 34.0* 31.0* 30.7* 28.4*  MCV 84.5 84.8 86.1 85.3 85.2  --  88.7 86.3  PLT 217 207 205 216 232  --  256 076   Basic Metabolic Panel: Recent Labs  Lab 01/27/18 0224  01/30/18 0203 01/31/18 0255 02/01/18 0302 02/01/18 0849 02/01/18 1151 02/02/18 0417  NA 137   < > 137 138 136 138 138 138  K 4.2   < > 3.8 4.0 4.3 4.6 4.4 4.6  CL 107   < > 103 107 102  --  108 106  CO2 25   < > '25 24 23  '$ --  21* 20*  GLUCOSE 92   < > 95 91 85  --  152* 125*  BUN 22   < > 29* 24* 20  --  21 22  CREATININE 1.29*   < > 1.60* 1.47* 1.49*  --  1.33* 1.17  CALCIUM 8.5*   < > 8.0* 8.1* 8.2*  --  7.7* 7.9*  MG 1.7  --   --   --   --    --   --   --    < > = values in this interval not displayed.   GFR: Estimated Creatinine Clearance: 58 mL/min (by C-G formula based on SCr of 1.17 mg/dL). Liver Function Tests: Recent Labs  Lab 01/27/18 0224 01/28/18 0310 01/30/18 0203 01/31/18 0255 02/01/18 0302  AST '21 21 17 21 25  '$ ALT '15 15 14 16 19  '$ ALKPHOS 65 71 69 70 80  BILITOT 0.5 0.5 0.8 0.8 0.9  PROT 4.9* 5.0* 4.9* 5.0* 5.6*  ALBUMIN 2.4* 2.4* 2.1* 2.1* 2.3*   CBG: Recent Labs  Lab 02/01/18 1606 02/01/18 1928 02/01/18 2311 02/02/18 0314 02/02/18 0747  GLUCAP 205* 179* 131* 111* 136*      Radiology Studies: Dg Chest Port 1 View  Result Date: 02/02/2018 CLINICAL DATA:  Chest tube with pleural effusion. EXAM: PORTABLE CHEST 1 VIEW COMPARISON:  Yesterday FINDINGS: Right apical chest tube in stable position. Hazy opacity in the right mid and lower lung is unchanged. Lucency at the right costophrenic sulcus is likely pneumothorax. Extensive chest wall emphysema on the right is stable. Haziness of the lower left chest is likely pleural fluid and atelectasis. Endotracheal tube tip is at the clavicular heads. An orogastric tube reaches the stomach. Stable dual-chamber pacer leads from the left.  Cardiomegaly. IMPRESSION: 1. Stable right chest tube positioning and hazy right base opacity. Probable basal pneumothorax on the right. 2. Atelectasis and pleural fluid on the left. 3. Stable hardware positioning. Electronically Signed   By: Monte Fantasia M.D.   On: 02/02/2018 09:54   Dg Chest Port 1 View  Result Date: 02/01/2018 CLINICAL DATA:  Orogastric tube placement. EXAM: PORTABLE CHEST 1 VIEW COMPARISON:  Radiograph of same day. FINDINGS: Stable cardiomediastinal silhouette. Endotracheal tube is in grossly good position. Interval placement of orogastric tube with tip in expected position of proximal stomach. Left-sided pacemaker is unchanged in position. Right-sided chest tube is again noted without definite pneumothorax.  Stable right basilar subsegmental atelectasis is noted probable with small loculated right pleural effusion. Stable subcutaneous emphysema is seen over right lateral chest wall. Stable left basilar atelectasis and possible effusion is noted. Status post bilateral shoulder arthroplasties. IMPRESSION: Endotracheal and orogastric tube are in grossly good position. Stable position of right-sided chest tube is noted without definite pneumothorax. Stable right basilar subsegmental atelectasis is noted with probable small loculated right pleural effusion Electronically Signed   By: Marijo Conception, M.D.   On: 02/01/2018 19:42   Dg Chest [facility] 1 View  Result  Date: 02/01/2018 CLINICAL DATA:  S/P thoracotomy EXAM: PORTABLE CHEST 1 VIEW COMPARISON:  01/29/2018 and CT 01/21/2017 FINDINGS: Endotracheal tube is 4.9 centimeters above the carina. The patient has a RIGHT-sided chest tube, tip overlying the RIGHT lung apex. There is focal pleural based opacity at the RIGHT mid lung zone. Decreased RIGHT pleural effusion. There is moderate RIGHT chest wall emphysema. Suspect RIGHT basilar pneumothorax, probably small. The LEFT lung is clear. Shallow inflation. IMPRESSION: 1. Postoperative changes. 2. Moderate RIGHT subcutaneous emphysema. Suspect small RIGHT basilar pneumothorax. 3. Smaller RIGHT pleural effusion. Electronically Signed   By: Nolon Nations M.D.   On: 02/01/2018 11:42        Scheduled Meds: . chlorhexidine gluconate (MEDLINE KIT)  15 mL Mouth Rinse BID  . dronedarone  400 mg Oral BID WC  . feeding supplement (ENSURE ENLIVE)  237 mL Oral TID BM  . ferrous sulfate  325 mg Oral Q breakfast  . fluticasone  2 spray Each Nare QHS  . folic acid  2 mg Oral Daily  . insulin aspart  0-24 Units Subcutaneous Q4H  . levalbuterol  0.63 mg Nebulization Q6H  . lipase/protease/amylase  72,000 Units Oral BID AC  . mouth rinse  15 mL Mouth Rinse 10 times per day  . megestrol  400 mg Oral BID  . senna-docusate  1  tablet Oral QHS  . senna-docusate  2 tablet Oral BID  . tamsulosin  0.4 mg Oral QPC supper  . vitamin B-6  25 mg Oral BID   Continuous Infusions: . acetaminophen Stopped (02/02/18 1150)  . ceFEPime (MAXIPIME) IV 2 g (02/01/18 1837)  . dextrose 5 % and 0.9% NaCl 50 mL/hr at 02/02/18 1200  . famotidine (PEPCID) IV Stopped (02/02/18 0831)  . phenylephrine (NEO-SYNEPHRINE) Adult infusion 50 mcg/min (02/02/18 1219)  . potassium chloride       LOS: 11 days    Time spent: 45 minutes.    Vernell Leep, MD, FACP, [facility]. [facility] Pager [phone]  If 7PM-7AM, please contact night-coverage [email or web address] Password [facility] 02/02/2018, 12:39 PM

## 2018-02-02 NOTE — Progress Notes (Signed)
Mr. Barry Curry had his VATS procedure yesterday.  Biopsies were taken of a pleural mass.  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  I would not think that this is metastatic renal cell.  I would worry about this being some kind of sarcoma or possibly mesothelioma.  He is intubated right now.  Hopefully, he will be able to come off ventilation support in a day or so.  I would not expect to have the pathology back for 3 or 4 days.  Typically, for these spindle cell tumors, it can take pathology a while to identify what exactly this is.  I would think though that this is a sarcoma.  His labs shows white cell count to be 24.9.  Hemoglobin 9.1.  Platelet count 246,000.  His creatinine is 1.17.  I did speak with his daughter and sister.  I gave them an update from my perspective.  If this is a sarcoma, I really am not sure if we will be able to treat this.  I would send it off for molecular analysis so that we can determine if there is any type of targeted therapy that we could use.  Lattie Haw, MD  [address]

## 2018-02-02 NOTE — Progress Notes (Signed)
PT Cancellation Note  Patient Details Name: Barry Curry MRN: [id number] DOB: 15-Jan-1941   Cancelled Treatment:    Reason Eval/Treat Not Completed: Patient at procedure or test/unavailable Other staff in patients room, will cont to follow  Reinaldo Berber, PT, DPT Acute Rehab Services Pager: [phone]     Reinaldo Berber 02/02/2018, 5:56 PM

## 2018-02-02 NOTE — Procedures (Signed)
Extubation Procedure Note  Patient Details:   Name: Barry Curry DOB: [birth year] MRN: [id number]   Airway Documentation:  Airway 7.5 mm (Active)  Secured at (cm) 25 cm 02/02/2018  8:50 AM  Measured From Lips 02/02/2018  8:50 AM  Secured Location Right 02/02/2018  8:50 AM  Secured By [employer] 02/02/2018  8:50 AM  Tube Holder Repositioned Yes 02/02/2018  8:50 AM  Cuff Pressure (cm H2O) 28 cm H2O 02/02/2018  8:50 AM  Site Condition Dry 02/02/2018  8:50 AM   Vent end date: 02/01/18 Vent end time: 1440   Evaluation  O2 sats: stable throughout Complications: No apparent complications Patient did tolerate procedure well. Bilateral Breath Sounds: Clear   Yes  Patient had a NIF of -35 and a VC of 1.7 pre extubation. Patient oriented to time and place. Had good productive cough. He had Incentive spirometer of 1250 time 5.  Dimple Nanas 02/02/2018, 10:11 AM

## 2018-02-03 ENCOUNTER — Inpatient Hospital Stay (HOSPITAL_COMMUNITY): Payer: Medicare Other

## 2018-02-03 LAB — GLUCOSE, CAPILLARY
GLUCOSE-CAPILLARY: 111 mg/dL — AB (ref 70–99)
GLUCOSE-CAPILLARY: 129 mg/dL — AB (ref 70–99)
GLUCOSE-CAPILLARY: 86 mg/dL (ref 70–99)
Glucose-Capillary: 81 mg/dL (ref 70–99)
Glucose-Capillary: 135 mg/dL — ABNORMAL HIGH (ref 70–99)
Glucose-Capillary: 76 mg/dL (ref 70–99)
Glucose-Capillary: 84 mg/dL (ref 70–99)
Glucose-Capillary: 96 mg/dL (ref 70–99)

## 2018-02-03 LAB — COMPREHENSIVE METABOLIC PANEL
ALT: 17 U/L (ref 0–44)
ANION GAP: 8 (ref 5–15)
AST: 31 U/L (ref 15–41)
Albumin: 1.7 g/dL — ABNORMAL LOW (ref 3.5–5.0)
Alkaline Phosphatase: 56 U/L (ref 38–126)
BUN: 24 mg/dL — ABNORMAL HIGH (ref 8–23)
CO2: 22 mmol/L (ref 22–32)
Calcium: 7.4 mg/dL — ABNORMAL LOW (ref 8.9–10.3)
Chloride: 110 mmol/L (ref 98–111)
Creatinine, Ser: 1.17 mg/dL (ref 0.61–1.24)
GFR calc Af Amer: >60 mL/min (ref >60)
GFR calc non Af Amer: 58 mL/min — ABNORMAL LOW (ref >60)
GLUCOSE: 91 mg/dL (ref 70–99)
POTASSIUM: 4.3 mmol/L (ref 3.5–5.1)
SODIUM: 140 mmol/L (ref 135–145)
Total Bilirubin: 0.7 mg/dL (ref 0.3–1.2)
Total Protein: 4.1 g/dL — ABNORMAL LOW (ref 6.5–8.1)

## 2018-02-03 LAB — CBC
HEMATOCRIT: 24.9 % — AB (ref 39.0–52.0)
HEMOGLOBIN: 8 g/dL — AB (ref 13.0–17.0)
MCH: 27.7 pg (ref 26.0–34.0)
MCHC: 32.1 g/dL (ref 30.0–36.0)
MCV: 86.2 fL (ref 78.0–100.0)
Platelets: 178 10*3/uL (ref 150–400)
RBC: 2.89 MIL/uL — ABNORMAL LOW (ref 4.22–5.81)
RDW: 19.2 % — AB (ref 11.5–15.5)
WBC: 17 10*3/uL — ABNORMAL HIGH (ref 4.0–10.5)

## 2018-02-03 MED ORDER — METOPROLOL TARTRATE 12.5 MG HALF TABLET
12.5000 mg | ORAL_TABLET | Freq: Two times a day (BID) | ORAL | Status: DC
  Administered 2018-02-03 – 2018-02-07 (×9): 12.5 mg via ORAL
  Filled 2018-02-03 (×9): qty 1

## 2018-02-03 MED ORDER — FUROSEMIDE 10 MG/ML IJ SOLN
40.0000 mg | Freq: Once | INTRAMUSCULAR | Status: AC
Start: 2018-02-03 — End: 2018-02-03
  Administered 2018-02-03: 40 mg via INTRAVENOUS
  Filled 2018-02-03: qty 4

## 2018-02-03 MED ORDER — FAMOTIDINE 20 MG PO TABS
20.0000 mg | ORAL_TABLET | Freq: Two times a day (BID) | ORAL | Status: DC
  Administered 2018-02-03: 20 mg via ORAL
  Filled 2018-02-03: qty 1

## 2018-02-03 MED ORDER — ACETAMINOPHEN 500 MG PO TABS: 1000.0000 mg | ORAL_TABLET | Freq: Four times a day (QID) | ORAL | Status: DC | PRN

## 2018-02-03 MED ORDER — PANTOPRAZOLE SODIUM 40 MG PO TBEC
40.0000 mg | DELAYED_RELEASE_TABLET | Freq: Every day | ORAL | Status: DC
  Administered 2018-02-03 – 2018-02-07 (×5): 40 mg via ORAL
  Filled 2018-02-03 (×5): qty 1

## 2018-02-03 MED ORDER — LEVALBUTEROL HCL 0.63 MG/3ML IN NEBU
0.6300 mg | INHALATION_SOLUTION | Freq: Three times a day (TID) | RESPIRATORY_TRACT | Status: DC
  Administered 2018-02-03 – 2018-02-04 (×6): 0.63 mg via RESPIRATORY_TRACT
  Filled 2018-02-03 (×7): qty 3

## 2018-02-03 NOTE — Progress Notes (Signed)
RT at bedside, Pt's RR has been in the upper 30's and cardiology wanted pt on BIPAP for the night. RTplace pt on BIPAP but within a couple of minutes pt stated he did not like how the mask felt and jerked mask off his face.  Pt placed back on [place] 4L [place] and RR is 26 SPO2 is 98.  RT will continue to monitor.

## 2018-02-03 NOTE — Progress Notes (Signed)
Patient ID: SABRINA ARRIAGA, male   DOB: 1941-05-30, 77 y.o.   MRN: [id number] TCTS Evening Rounds  Hemodynamically stable  Some increased work of breathing tonight sats 99% on 4L [place]. Lungs sound ok  Will give him a dose of lasix since he has severe AS and has extra volume on board.  Will try a course of bipap.

## 2018-02-03 NOTE — Progress Notes (Signed)
PROGRESS NOTE    Barry Curry  IRW:[id number] DOB: Feb 08, 1941 DOA: 01/21/2018 PCP: [name], PA-C    Brief Narrative:  Barry Curry a 77 y.o.malewith medical history significant ofhypertension, hyperlipidemia, GERD, depression, anxiety, atrial fibrillation not on anticoagulant, CAD, PVC, aortic stenosis, RBBB, iron deficiency anemia, CKD, clear renal cell cancer (left nephrectomy), melanoma, OSA who presented with shortness of breath. 3 weeks prior patient had a right thoracentesis at [facility] with 670 cc of bloody fluid removed. During this admission on 01/22/2018 patient had a ultrasound-guided right thoracentesis with 2 L of bloody fluid removed. Pleural fluid sent for Gram stain, culture, cytology. Pulmonology consulted to assess for recurrence of pleural effusion. Exudative pleural fluid noted. He underwent repeat thoracentesis on 7/31 for recurrent pleural effusion, with cytology showing reactive mesothelial cells.  TCTS consulted and patient underwent right video-assisted thoracoscopy, biopsy of pleural masses and talc pleurodesis 8/6.  He was extubated postprocedure but then became very anxious and tachypneic for which she was put on low-dose Precedex for pain control but became obtunded and hypercapnic for which she was reintubated and admitted to ICU.  Extubated 8/7.  Pleural fluid 8/3 shows atypical cells.   Assessment & Plan:   Principal Problem:   Recurrent right pleural effusion Active Problems:   Essential hypertension   GERD   Acute respiratory failure with hypoxia (HCC)   PAF (paroxysmal atrial fibrillation) (HCC)   Clear cell carcinoma of kidney, left (HCC)   Anxiety   CAD (coronary artery disease)   Iron deficiency anemia   Elevated troponin   CKD (chronic kidney disease), stage III (HCC)   Malnutrition of moderate degree   S/P thoracotomy   Acute respiratory failure with hypoxia secondary to recurrent pleural effusion, possibly  malignant effusion  - IR guided thoracentesis 4 times in the last one month. - exudative, gram stain negative, culture negative and cytology showing reactive mesothelial cells on 7/31 but atypical cells on 8/3. -Post VATS required reintubation on 8/6 and admission to ICU.  Extubated 8/7 and saturating normally on room air.  Incentive spirometry.  Avoid narcotics (sensitive). - He is afebrile and has persistent leukocytosis.  - Pro calcitonin level is negative, indicative of non infective process but in view of the consolidative process and persistent leukocytosis and reactive mesothelial cell on cytology, as per discussion with ID, completed 5 days of IV cefepime for possible HCAP.  Pleural culture results from 7/27: Negative. - TCTS consulted and patient underwent right video-assisted thoracoscopy, biopsy of pleural masses and talc pleurodesis 8/6.  Pathology results pending.  Right chest tube in place. -Chest x-ray 8/7 personally reviewed.  Small right basal pneumothorax.   -As per TCTS, chest tube to continue for additional 24 hours.  Paroxysmal atrial fibrillation:  On dronedarone.  Metoprolol currently on hold.  Likely not anticoagulation candidate due to hemorrhagic pleural effusion.  Mild sinus tachycardia in the 100s-110s with occasional PVCs noted on 8/8.  Blood pressures okay.  Resume metoprolol.  Iron deficiency anemia/ acute blood loss anemia:  IV iron and aranesp ordered by oncology.  He underwent 2 units of prbc transfusion.  Hemoglobin has dropped from 9.1-8 over the last 24 hours.  Could be related to hemorrhagic fluid in chest tube.  Follow CBC in a.m. and transfuse if hemoglobin <7 g per DL.  Essential hypertension:  Mildly uncontrolled.  CAD:  no chest pain or sob. Resume BB.   GERD  stable.   Mild Acute on Stage 3 CKD.  Creatinine has normalized in the 1.1 range for the last 2 days.  Leukocytosis:  - suspect reactive. Pro calcitonin is negative.  Afebrile, no  dysuria symptoms. Urine culture report grew 20,000 pseudomonas.  Blood cultures negative, final report. Pleural fluid culture negative, final report.  Cytology from pleural fluid analysis shows reactive mesothelial cells on initial testing 7/31 and atypical cells on 8/3. Leukocytosis is better today.  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 Marin Olp does not think that this is metastatic renal cell carcinoma and more concerned about some kind of sarcoma or possibly mesothelioma.  Pathology may take several days to come back. Follow up with PET/CT scan for biopsy of the adrenal nodules and pulmonary nodules.   Moderate Protein Calorie malnutrition:  - nutrition consultation and input appreciated.  Continue nutritional supplements.   DVT prophylaxis: SCD'S Code Status: full code.  Family Communication: Discussed in detail with patient's sister at bedside.  Updated care and answered questions. Disposition Plan: Patient was transferred to ICU post procedure on 8/6. As per d/w family and RN, TCTS plans to continue monitoring in ICU for additional 24 hours.  Consultants:   Oncology  Tcts.   Procedures:  IR thoracentesis on 7/31,  8/6 VATS, with pleural biopsy, talc pleurodesis.  Right chest tube. Foley catheter -DC'd 8/8 Arterial line DC'd 8/8. Oral ET tube-discontinued 8/7. RUE PICC line 8/7>  Antimicrobials: cefepime since 8/2 >8/6  Subjective: Feels much better.  Dyspnea significantly improved.  Cough with intermittent greenish sputum.  No chest pain.  Appetite improved.  Objective: Vitals:   02/03/18 0600 02/03/18 0700 02/03/18 0733 02/03/18 0800  BP: (!) 146/74 (!) 158/93  112/60  Pulse: (!) 103 80  (!) 103  Resp: (!) 25 (!) 38  (!) 37  Temp:   98 F (36.7 C)   TempSrc:   Oral   SpO2: 97% 97%  97%  Weight:      Height:         Intake/Output Summary (Last 24 hours) at 02/03/2018 1041 Last data filed at 02/03/2018 0800 Gross per 24 hour  Intake 1876.98 ml  Output 1390 ml  Net 486.98 ml   Filed Weights   01/21/18 1107  Weight: 92.1 kg    Examination:  General exam: Pleasant elderly male, moderately built and nourished lying comfortably propped up in bed.  Looks much improved compared to yesterday morning. Respiratory system: Improved breath sounds in right lung fields.  Slightly diminished in right base with occasional crackles and slightly harsh.  Left lung fields clear to auscultation.  Right-sided chest tube draining bloody fluid.  No increased work of breathing. Cardiovascular system: S1 and S2 heard, regular and mild tachycardia.  No JVD or murmurs.  Trace bilateral upper extremity edema but no lower extremity edema (improving.  Telemetry personally reviewed: Sinus tachycardia in the 100-110's  Occasional PVCs. Gastrointestinal system: Abdomen is nondistended, soft and nontender.  Normal bowel sounds heard.  No organomegaly or masses appreciated.  Stable. Central nervous system: Alert and oriented.  No focal neurological deficits.  Stable. Extremities: Moves all extremities symmetrically. Skin: No rashes, lesions or ulcers Psychiatry: Pleasant and appropriate.    Data Reviewed: I have personally reviewed following labs and imaging studies  CBC: Recent Labs  Lab 01/28/18 0310 01/29/18 0215 01/30/18 8416 01/31/18 0255 02/01/18 6063 02/01/18 1115 02/02/18 0417 02/03/18  0400  WBC 16.9* 18.8* 16.8* 14.0*  --  25.5* 24.9* 17.0*  NEUTROABS 13.6* 15.3* 13.8* 11.7*  --   --   --   --   HGB 8.8* 10.9* 10.6* 10.8* 10.5* 9.7* 9.1* 8.0*  HCT 27.8* 34.1* 32.6* 34.0* 31.0* 30.7* 28.4* 24.9*  MCV 84.8 86.1 85.3 85.2  --  88.7 86.3 86.2  PLT 207 205 216 232  --  256 246 191   Basic Metabolic Panel: Recent Labs  Lab 01/31/18 0255 02/01/18 0302 02/01/18 0849 02/01/18 1151 02/02/18 0417  02/03/18 0400  NA 138 136 138 138 138 140  K 4.0 4.3 4.6 4.4 4.6 4.3  CL 107 102  --  108 106 110  CO2 24 23  --  21* 20* 22  GLUCOSE 91 85  --  152* 125* 91  BUN 24* 20  --  21 22 24*  CREATININE 1.47* 1.49*  --  1.33* 1.17 1.17  CALCIUM 8.1* 8.2*  --  7.7* 7.9* 7.4*   GFR: Estimated Creatinine Clearance: 58 mL/min (by C-G formula based on SCr of 1.17 mg/dL). Liver Function Tests: Recent Labs  Lab 01/28/18 0310 01/30/18 0203 01/31/18 0255 02/01/18 0302 02/03/18 0400  AST 21 17 21 25 31   ALT 15 14 16 19 17   ALKPHOS 71 69 70 80 56  BILITOT 0.5 0.8 0.8 0.9 0.7  PROT 5.0* 4.9* 5.0* 5.6* 4.1*  ALBUMIN 2.4* 2.1* 2.1* 2.3* 1.7*   CBG: Recent Labs  Lab 02/02/18 1725 02/02/18 1934 02/02/18 2337 02/03/18 0357 02/03/18 0730  GLUCAP 81 135* 111* 76 84      Radiology Studies: Dg Chest Port 1 View  Result Date: 02/02/2018 CLINICAL DATA:  Chest tube with pleural effusion. EXAM: PORTABLE CHEST 1 VIEW COMPARISON:  Yesterday FINDINGS: Right apical chest tube in stable position. Hazy opacity in the right mid and lower lung is unchanged. Lucency at the right costophrenic sulcus is likely pneumothorax. Extensive chest wall emphysema on the right is stable. Haziness of the lower left chest is likely pleural fluid and atelectasis. Endotracheal tube tip is at the clavicular heads. An orogastric tube reaches the stomach. Stable dual-chamber pacer leads from the left.  Cardiomegaly. IMPRESSION: 1. Stable right chest tube positioning and hazy right base opacity. Probable basal pneumothorax on the right. 2. Atelectasis and pleural fluid on the left. 3. Stable hardware positioning. Electronically Signed   By: Monte Fantasia M.D.   On: 02/02/2018 09:54   Dg Chest Port 1 View  Result Date: 02/01/2018 CLINICAL DATA:  Orogastric tube placement. EXAM: PORTABLE CHEST 1 VIEW COMPARISON:  Radiograph of same day. FINDINGS: Stable cardiomediastinal silhouette. Endotracheal tube is in grossly good position.  Interval placement of orogastric tube with tip in expected position of proximal stomach. Left-sided pacemaker is unchanged in position. Right-sided chest tube is again noted without definite pneumothorax. Stable right basilar subsegmental atelectasis is noted probable with small loculated right pleural effusion. Stable subcutaneous emphysema is seen over right lateral chest wall. Stable left basilar atelectasis and possible effusion is noted. Status post bilateral shoulder arthroplasties. IMPRESSION: Endotracheal and orogastric tube are in grossly good position. Stable position of right-sided chest tube is noted without definite pneumothorax. Stable right basilar subsegmental atelectasis is noted with probable small loculated right pleural effusion Electronically Signed   By: Marijo Conception, M.D.   On: 02/01/2018 19:42   Dg Chest Port 1 View  Result Date: 02/01/2018 CLINICAL DATA:  S/P thoracotomy EXAM: PORTABLE CHEST 1 VIEW COMPARISON:  01/29/2018 and CT 01/21/2017 FINDINGS: Endotracheal tube is 4.9 centimeters above the carina. The patient has a RIGHT-sided chest tube, tip overlying the RIGHT lung apex. There is focal pleural based opacity at the RIGHT mid lung zone. Decreased RIGHT pleural effusion. There is moderate RIGHT chest wall emphysema. Suspect RIGHT basilar pneumothorax, probably small. The LEFT lung is clear. Shallow inflation. IMPRESSION: 1. Postoperative changes. 2. Moderate RIGHT subcutaneous emphysema. Suspect small RIGHT basilar pneumothorax. 3. Smaller RIGHT pleural effusion. Electronically Signed   By: Nolon Nations M.D.   On: 02/01/2018 11:42   [place] Ekg Site Rite  Result Date: 02/02/2018 If Site Rite image not attached, placement could not be confirmed due to current cardiac rhythm.       Scheduled Meds: . Chlorhexidine Gluconate Cloth  6 each Topical Daily  . dronedarone  400 mg Oral BID WC  . famotidine  20 mg Oral BID  . feeding supplement (ENSURE ENLIVE)  237 mL Oral TID  BM  . ferrous sulfate  325 mg Oral Q breakfast  . fluticasone  2 spray Each Nare QHS  . folic acid  2 mg Oral Daily  . insulin aspart  0-24 Units Subcutaneous Q4H  . levalbuterol  0.63 mg Nebulization TID  . lipase/protease/amylase  72,000 Units Oral BID AC  . mouth rinse  15 mL Mouth Rinse BID  . megestrol  400 mg Oral BID  . senna-docusate  1 tablet Oral QHS  . senna-docusate  2 tablet Oral BID  . sodium chloride flush  10-40 mL Intracatheter Q12H  . tamsulosin  0.4 mg Oral QPC supper  . vitamin B-6  25 mg Oral BID   Continuous Infusions: . sodium chloride 10 mL/hr at 02/03/18 0614  . potassium chloride       LOS: 12 days    Time spent: 25 minutes.    Vernell Leep, MD, FACP, [facility]. [facility] Pager [phone]  If 7PM-7AM, please contact night-coverage [email or web address] Password [facility] 02/03/2018, 10:41 AM

## 2018-02-03 NOTE — Progress Notes (Signed)
2 Days Post-Op Procedure(s) (LRB): VIDEO ASSISTED THORACOSCOPY (Right) PLEURAL BIOPSY (Right) DRAINAGE OF PLEURAL EFFUSION (Right) TALC PLEURADESIS (Right) Subjective: Feeling better. Slept a little. Eating some.  Objective: Vital signs in last 24 hours: Temp:  [97.5 F (36.4 C)-99.8 F (37.7 C)] 99.8 F (37.7 C) (08/08 0355) Pulse Rate:  [44-118] 80 (08/08 0700) Cardiac Rhythm: Normal sinus rhythm (08/07 1230) Resp:  [16-43] 38 (08/08 0700) BP: (88-158)/(52-93) 158/93 (08/08 0700) SpO2:  [97 %-100 %] 97 % (08/08 0700) Arterial Line BP: (90-120)/(42-66) 115/52 (08/08 0600) FiO2 (%):  [40 %] 40 % (08/07 0915)  Hemodynamic parameters for last 24 hours:    Intake/Output from previous day: 08/07 0701 - 08/08 0700 In: 2703.2 [P.O.:480; I.V.:1363.6; NG/GT:360; IV Piggyback:499.6] Out: 1425 [Urine:1090; Chest Tube:335] Intake/Output this shift: No intake/output data recorded.  General appearance: alert and cooperative Heart: regular rate and rhythm, S1, S2 normal, no murmur, click, rub or gallop Lungs: good breath sounds bilat. Few crackles at right base Wound: dressing dry chest tube output serosanguinous, no air leak  Lab Results: Recent Labs    02/02/18 0417 02/03/18 0400  WBC 24.9* 17.0*  HGB 9.1* 8.0*  HCT 28.4* 24.9*  PLT 246 178   BMET:  Recent Labs    02/02/18 0417 02/03/18 0400  NA 138 140  K 4.6 4.3  CL 106 110  CO2 20* 22  GLUCOSE 125* 91  BUN 22 24*  CREATININE 1.17 1.17  CALCIUM 7.9* 7.4*    PT/INR: No results for input(s): LABPROT, INR in the last 72 hours. ABG    Component Value Date/Time   PHART 7.400 02/02/2018 0959   HCO3 18.8 (L) 02/02/2018 0959   TCO2 20 (L) 02/02/2018 0959   ACIDBASEDEF 5.0 (H) 02/02/2018 0959   O2SAT 99.0 02/02/2018 0959   CBG (last 3)  Recent Labs    02/02/18 1934 02/02/18 2337 02/03/18 0357  GLUCAP 135* 111* 76   CXR: stable aeration on right. Mild haziness from talc. Subcutaneous air stable. No  ptx.  Assessment/Plan: S/P Procedure(s) (LRB): VIDEO ASSISTED THORACOSCOPY (Right) PLEURAL BIOPSY (Right) DRAINAGE OF PLEURAL EFFUSION (Right) TALC PLEURADESIS (Right)  Hemodynamically stable overnight. Respiratory status stable on room air. Continue IS. Chest tube output 70 cc this am. Will keep tube in today and probably remove tomorrow. OOB, ambulate with PT. Pathology pending. Encourage po diet. Discussed status and plans with patient and daughter. Discussed operative findings with him.   LOS: 12 days    Gaye Pollack 02/03/2018

## 2018-02-03 NOTE — Progress Notes (Signed)
Pt was tachypenic in the upper 30's while falling asleep. Placed on BIPAP by RT, but after 1-2 min, pt refused to keep it on and jerked it off his face. He was placed back on 4L [place] and his RR was in the mid 20's, 98% SpO2. Will continue to monitor RR and oxygen sats.

## 2018-02-03 NOTE — Progress Notes (Signed)
Physical Therapy Treatment Patient Details Name: Barry Curry MRN: [id number] DOB: 28-Mar-1941 Today's Date: 02/03/2018    History of Present Illness 77 y.o. male with admitted with right pleural effusion with new lymphadenopathy s/p VATS 8/6. PMhx: 7/27 thoracentesis, HTN, HLD, GERD, depression, anxiety, Afib, CAD, PVC, AS, RBBB, renal CA, melenoma    PT Comments    Arrived to patient up in chair with friend and nurse, apprehensive but agreeable to try therapy. Prior to therapy in seated HR 130, SpO2 96 at RA, RR 22, BP 139/90 (calf). Pt received lopressor prior to session with RN clearing mobility. During activity BP 145/114 (125) and 163/108 (125) , RR 24-26, SpO2 93-73. Pt with afib and paired PVCs intermittent through treatment. Pt easily fatigued with knees buckling after ambulating 5 ft. No significant change in rate or rhythm throughout treatment. Pt educated for HEP and will continue to follow as medical status allows.      Follow Up Recommendations  SNF     Equipment Recommendations       Recommendations for Other Services       Precautions / Restrictions Precautions Precautions: Fall Precaution Comments: chair follow, chest tube Restrictions Weight Bearing Restrictions: No    Mobility  Bed Mobility               General bed mobility comments: in chair on arrival and end of sesion  Transfers Overall transfer level: Needs assistance   Transfers: Sit to/from Stand Sit to Stand: Min assist         General transfer comment: cues for hand placement. Min assist 2 trials from recliner  Ambulation/Gait Ambulation/Gait assistance: Min assist;+2 safety/equipment Gait Distance (Feet): 5 Feet Assistive device: Rolling walker (2 wheeled) Gait Pattern/deviations: Step-through pattern;Decreased stride length;Shuffle;Trunk flexed Gait velocity: decreased  Gait velocity interpretation: <1.31 ft/sec, indicative of household ambulator General Gait Details: close  chair follow with pt buckling after 5'. pt unable to attempt further trials due to fatigue. HR 133-148, SpO2 95% on RA   Stairs             Wheelchair Mobility    Modified Rankin (Stroke Patients Only)       Balance Overall balance assessment: Needs assistance   Sitting balance-Leahy Scale: Fair       Standing balance-Leahy Scale: Poor Standing balance comment: heavy UE support for balance                            Cognition Arousal/Alertness: Awake/alert Behavior During Therapy: WFL for tasks assessed/performed Overall Cognitive Status: Within Functional Limits for tasks assessed                                        Exercises General Exercises - Lower Extremity Long Arc Quad: AROM;15 reps;Both(L leg weaker than right, minimal resistance applied during long arc quads ) Hip Flexion/Marching: AROM;15 reps;Both    General Comments        Pertinent Vitals/Pain Pain Assessment: 0-10 Pain Score: 4  Pain Location: right chest Pain Descriptors / Indicators: Aching Pain Intervention(s): Limited activity within patient's tolerance;Repositioned    Home Living                      Prior Function            PT Goals (current goals can now be  found in the care plan section) Acute Rehab PT Goals Time For Goal Achievement: 02/17/18 Potential to Achieve Goals: Fair Progress towards PT goals: Not progressing toward goals - comment;Goals downgraded-see care plan(progressive decline in function )    Frequency    Min 2X/week      PT Plan Current plan remains appropriate    Co-evaluation              AM-PAC PT "6 Clicks" Daily Activity  Outcome Measure  Difficulty turning over in bed (including adjusting bedclothes, sheets and blankets)?: A Little Difficulty moving from lying on back to sitting on the side of the bed? : Unable Difficulty sitting down on and standing up from a chair with arms (e.g., wheelchair,  bedside commode, etc,.)?: Unable Help needed moving to and from a bed to chair (including a wheelchair)?: A Little Help needed walking in hospital room?: A Lot Help needed climbing 3-5 steps with a railing? : Total 6 Click Score: 11    End of Session Equipment Utilized During Treatment: Gait belt Activity Tolerance: Patient limited by fatigue;Treatment limited secondary to medical complications (Comment)(afib with pair of PVCs ) Patient left: in chair;with call bell/phone within reach;with family/visitor present Nurse Communication: (Pt received rate medication prior to treatment, and addressed HR with RN after ) PT Visit Diagnosis: Unsteadiness on feet (R26.81);Other abnormalities of gait and mobility (R26.89);Muscle weakness (generalized) (M62.81);Difficulty in walking, not elsewhere classified (R26.2)     Time: 4481-8563 PT Time Calculation (min) (ACUTE ONLY): 26 min  Charges:  $Therapeutic Exercise: 8-22 mins $Therapeutic Activity: 8-22 mins                     Samuella Bruin, [place]  Acute Rehab [phone]    Samuella Bruin 02/03/2018, 1:35 PM

## 2018-02-04 ENCOUNTER — Inpatient Hospital Stay (HOSPITAL_COMMUNITY): Payer: Medicare Other

## 2018-02-04 DIAGNOSIS — J910 Malignant pleural effusion: Secondary | ICD-10-CM

## 2018-02-04 DIAGNOSIS — C649 Malignant neoplasm of unspecified kidney, except renal pelvis: Secondary | ICD-10-CM

## 2018-02-04 DIAGNOSIS — C799 Secondary malignant neoplasm of unspecified site: Secondary | ICD-10-CM

## 2018-02-04 LAB — GLUCOSE, CAPILLARY
GLUCOSE-CAPILLARY: 117 mg/dL — AB (ref 70–99)
GLUCOSE-CAPILLARY: 124 mg/dL — AB (ref 70–99)
GLUCOSE-CAPILLARY: 73 mg/dL (ref 70–99)
GLUCOSE-CAPILLARY: 90 mg/dL (ref 70–99)
Glucose-Capillary: 83 mg/dL (ref 70–99)
Glucose-Capillary: 130 mg/dL — ABNORMAL HIGH (ref 70–99)
Glucose-Capillary: 92 mg/dL (ref 70–99)

## 2018-02-04 LAB — CBC
HEMATOCRIT: 26.7 % — AB (ref 39.0–52.0)
HEMOGLOBIN: 8.3 g/dL — AB (ref 13.0–17.0)
MCH: 27.2 pg (ref 26.0–34.0)
MCHC: 31.1 g/dL (ref 30.0–36.0)
MCV: 87.5 fL (ref 78.0–100.0)
Platelets: 191 10*3/uL (ref 150–400)
RBC: 3.05 MIL/uL — AB (ref 4.22–5.81)
RDW: 19.2 % — ABNORMAL HIGH (ref 11.5–15.5)
WBC: 17.8 10*3/uL — ABNORMAL HIGH (ref 4.0–10.5)

## 2018-02-04 LAB — PREPARE RBC (CROSSMATCH)

## 2018-02-04 MED ORDER — POTASSIUM CHLORIDE CRYS ER 20 MEQ PO TBCR
20.0000 meq | EXTENDED_RELEASE_TABLET | Freq: Two times a day (BID) | ORAL | Status: DC
  Administered 2018-02-04 – 2018-02-06 (×5): 20 meq via ORAL
  Filled 2018-02-04 (×5): qty 1

## 2018-02-04 MED ORDER — FUROSEMIDE 10 MG/ML IJ SOLN
40.0000 mg | Freq: Once | INTRAMUSCULAR | Status: AC
  Administered 2018-02-04: 40 mg via INTRAVENOUS
  Filled 2018-02-04: qty 4

## 2018-02-04 MED ORDER — PHENOL 1.4 % MT LIQD
1.0000 | OROMUCOSAL | Status: DC | PRN
  Administered 2018-02-04 – 2018-02-05 (×2): 1 via OROMUCOSAL
  Filled 2018-02-04: qty 177

## 2018-02-04 MED ORDER — FUROSEMIDE 40 MG PO TABS
40.0000 mg | ORAL_TABLET | Freq: Every day | ORAL | Status: DC
  Administered 2018-02-05: 40 mg via ORAL
  Filled 2018-02-04: qty 1

## 2018-02-04 MED ORDER — SODIUM CHLORIDE 0.9% IV SOLUTION
Freq: Once | INTRAVENOUS | Status: AC
  Administered 2018-02-04: 11:00:00 via INTRAVENOUS

## 2018-02-04 MED ORDER — FUROSEMIDE 40 MG PO TABS: 40.0000 mg | ORAL_TABLET | Freq: Every day | ORAL | Status: DC

## 2018-02-04 MED ORDER — MENTHOL 3 MG MT LOZG
1.0000 | LOZENGE | OROMUCOSAL | Status: DC | PRN
  Filled 2018-02-04: qty 9

## 2018-02-04 NOTE — Progress Notes (Signed)
CT surgery p.m. Rounds  Patient's pulmonary status more stable this p.m. after removal of right chest tube Some serous fluid-pleural fluid drained from chest tube site Change dressing as needed Repeat chest x-ray in a.m.

## 2018-02-04 NOTE — Progress Notes (Signed)
CSW will continue to follow and assist with disposition as needed  Jorge Ny, [place] Social Worker [phone]

## 2018-02-04 NOTE — Progress Notes (Signed)
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  I sent the specimen off for genetic analysis.  This probably will take about 10-14 days.  He is still quite weak.  He still has a chest tube in.  He is still quite tachycardic.  I talked to he and his family this morning.  I explained that this is metastatic renal cell cancer.  Whether or not we can treat him will really depend on his performance status.  It sounds like he is going to go to rehab to try to improve his status.  He has had slightly better appetite.  Hopefully, the Marinol is helping this.  He is quite anemic with a hemoglobin of 8.3.  He might need to be transfused.  This will likely help his cardiopulmonary status.  I would think that he would be in shape to go to rehab next week.  He still has the chest tube in.  He is quite tachycardic.  He is afebrile.  His blood pressure is 107/61.  Again, his hemoglobin letter to be watched closely.  I would have a very low threshold to transfuse him.  Lattie Haw, MD  Psalm 91:10

## 2018-02-04 NOTE — Progress Notes (Signed)
3 Days Post-Op Procedure(s) (LRB): VIDEO ASSISTED THORACOSCOPY (Right) PLEURAL BIOPSY (Right) DRAINAGE OF PLEURAL EFFUSION (Right) TALC PLEURADESIS (Right) Subjective: Feels better this am after diuresis. Did not tolerate Bipap due to claustrophobia. Sitting up in chair with family. Dr. Marin Olp saw him this am and discussed path with him.  Objective: Vital signs in last 24 hours: Temp:  [97.6 F (36.4 C)-99.5 F (37.5 C)] 98.1 F (36.7 C) (08/09 0635) Pulse Rate:  [59-133] 120 (08/09 0635) Cardiac Rhythm: Normal sinus rhythm (08/09 0400) Resp:  [13-43] 27 (08/09 0635) BP: (107-150)/(52-86) 107/61 (08/09 0635) SpO2:  [94 %-100 %] 98 % (08/09 0635) Weight:  [94.4 kg] 94.4 kg (08/09 0640)  Hemodynamic parameters for last 24 hours:    Intake/Output from previous day: 08/08 0701 - 08/09 0700 In: 710 [P.O.:630; I.V.:80] Out: 2360 [Urine:2240; Chest Tube:120] Intake/Output this shift: No intake/output data recorded.  General appearance: alert and cooperative Neurologic: intact Heart: irregularly irregular rhythm Lungs: improving breath sounds on right with few crackles in right base Extremities: edema mild peripheral edema Wound: dressing dry Chest tube output low, serous. No air leak  Lab Results: Recent Labs    02/03/18 0400 02/04/18 0306  WBC 17.0* 17.8*  HGB 8.0* 8.3*  HCT 24.9* 26.7*  PLT 178 191   BMET:  Recent Labs    02/02/18 0417 02/03/18 0400  NA 138 140  K 4.6 4.3  CL 106 110  CO2 20* 22  GLUCOSE 125* 91  BUN 22 24*  CREATININE 1.17 1.17  CALCIUM 7.9* 7.4*    PT/INR: No results for input(s): LABPROT, INR in the last 72 hours. ABG    Component Value Date/Time   PHART 7.400 02/02/2018 0959   HCO3 18.8 (L) 02/02/2018 0959   TCO2 20 (L) 02/02/2018 0959   ACIDBASEDEF 5.0 (H) 02/02/2018 0959   O2SAT 99.0 02/02/2018 0959   CBG (last 3)  Recent Labs    02/03/18 1934 02/03/18 2342 02/04/18 0403  GLUCAP 96 90 83   CXR: stable density on right  that may be due to atelectasis, talc, infiltrate.  Assessment/Plan: S/P Procedure(s) (LRB): VIDEO ASSISTED THORACOSCOPY (Right) PLEURAL BIOPSY (Right) DRAINAGE OF PLEURAL EFFUSION (Right) TALC PLEURADESIS (Right)  POD 3  He has been hemodynamically stable Atrial fib with rapid rate this am in the 120's. Continue his Multaq and Lopressor. DC chest tube  Improvement in respiratory status after diuresis last night. 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 Symptomatic anemia with Hgb 8.3. Will transfuse one unit of PRBC's and give a dose of lasix afterward. Continue Fe2+. Continue IS, ambulation with PT.  Will consult OT anticipating possible SNF at discharge.   LOS: 13 days    Gaye Pollack 02/04/2018

## 2018-02-04 NOTE — Discharge Summary (Signed)
Physician Discharge Summary       [address]       [place] [address]             [phone]    Patient ID: Barry Curry MRN: [id number] DOB/AGE: 10/12/1940 77 y.o.  Admit date: 01/21/2018 Discharge date: 02/07/2018  Admission Diagnoses: 1. Recurrent right pleural effusion 2. Right pleural masses  Discharge Diagnoses:  1. S/P thoracotomy 2. ABL anemia 3. Acute respiratory failure with hypoxia (HCC) 4. History of essential hypertension 5. History of GERD 6.  History of  PAF (paroxysmal atrial fibrillation) ([place]) 7. History of clear cell carcinoma of kidney, left ([place]) 8. History of anxiety 9. History of CAD (coronary artery disease) 10. History of CKD (chronic kidney disease), stage III ([place]) 11. Malnutrition of moderate degree 12. History of cancer (HCC)-Melanoma back of neck 13. History of cardiomyopathy ([place]) 14. History of hyperlipidemia 15. History of RBBB (right bundle branch block with left anterior fascicular block) 16. History of  PVC (premature ventricular contraction)   Consultants: Pulmonary and Oncology   Procedure (s):  Right video-assisted thoracoscopy, Right muscle-sparing thoracotomy for evacuation of complex bloody right pleural effusion, biopsy of pleural implants, talc pleurodesis by Dr. Cyndia Bent on 02/01/2018.  Pathology: Final pathology results pending  History of Presenting Illness: Barry Curry is a 77 yo man with a complicated medical history including nephrectomy for clear cell carcinoma, melanoma, atrial fibrillation, aortic stenosis, mitral stenosis, cardiomyopathy, hypertension, hyperlipidemia, arthritis, and depression. Developed shortness of breath in late June. CT chest 6/30 showed a moderate right pleural effusion, no chest wall nodule or mass. July 1 thoracentesis drained ~670 ml, some symptomatic improvement. Apparently not sent for cytology(at least not within our system). Developed recurrent dyspnea. Treated with  empiric doxycycline without benefit. Ct on 7.26 showed a recurrent effusion, right IM node and pleural nodules. Had 2 liters drained on 7/27, incompletely drained. Another 2 L drained 7/31. Cytology on both specimens still pending.   [name] with multiple medical problems including melanoma, clear cell renal cell cancer, atrial fibrillation, and moderate aortic stenosis presents with a recurrent right pleural effusion. The effusion is almost certainly malignant and appears to be rapidly progressive with nodularity of the pleura noted on recent CT not visible on CT from a month ago.  He is a relatively high risk surgical candidate given his cardiac issues and general frailty.   Cytology was negative on the pleural fluid.  Dr. Roxan Hockey initially saw the patient in consultation;however, he had leave town so Dr. Cyndia Bent discussed the need for right VATS, biopsy of pleural implants, and talc pleurodesis. Potential risks, benefits, and complications of the surgery were discussed with the patient and he agreed to proceed with surgery. He underwent a right VATS, muscle sparing right thoracotomy, biopsy of pleural implants, and talc pleurodesis on 02/01/2018.  Brief Hospital Course:  The patient remained afebrile and hemodynamically stable. He was extubated the afternoon of 08/06;however he became anxious, had tachypnea, and hypercarbic. He was re intubated. He was put on Precedex and Neo Synephrine drips. He has a history of a fib and was in it post op. He was on Multaq and Lopressor. He was anemic post op and required a transfusion. A line and foley were removed early in the post operative course. Chest tube output gradually decreased.  Daily chest x rays were obtained and remained stable. Chest tube was removed on 02/04/2018. Patient is ambulating on room air. Patient  is tolerating a diet and has had a bowel movement. Wounds are clean and dry. Final chest X ray showed stable subcutaneous  emphysema right lateral chest wall and neck, no pneumothorax, patchy pleural and parenchymal densities throughout the right chest have minimally changed, left lung appears to be clear. Patient is felt surgically stable for discharge today.   We ask the SNF to please do the following: 1. Please obtain vital signs at least one time daily 2.Please weigh the patient daily. If he or she continues to gain weight, develops lower extremity edema,or has worsening shortness of breath, contact the office at [phone]. 3. Ambulate patient at least three times daily and please use sternal precautions.  Latest Vital Signs: Blood pressure 106/61, pulse 97, temperature 97.8 F (36.6 C), temperature source Oral, resp. rate (!) 31, height 6' (1.829 m), weight 91.9 kg, SpO2 95 %.  Physical Exam: General appearance: alert, cooperative and no distress Heart: irregularly irregular rhythm Lungs: diminished at right base and left lung is clear Abdomen: soft, non-tender; bowel sounds normal; no masses,  no organomegaly Extremities: extremities normal, atraumatic, no cyanosis or edema Wound: clean and dry  Discharge Condition: Stable and discharged to SNF ([facility])  Recent laboratory studies:  Lab Results  Component Value Date   WBC 15.7 (H) 02/07/2018   HGB 9.3 (L) 02/07/2018   HCT 29.4 (L) 02/07/2018   MCV 85.0 02/07/2018   PLT 224 02/07/2018   Lab Results  Component Value Date   NA 137 02/07/2018   K 5.0 02/07/2018   CL 103 02/07/2018   CO2 27 02/07/2018   CREATININE 1.37 (H) 02/07/2018   GLUCOSE 86 02/07/2018    Diagnostic Studies: Dg Chest 1 View  Result Date: 01/29/2018 CLINICAL DATA:  Status post right thoracentesis EXAM: CHEST  1 VIEW COMPARISON:  01/28/2018 FINDINGS: Right-sided pleural effusion has reduced significantly. Some residual pleural thickening is noted laterally. No pneumothorax is seen. Cardiac shadow is stable. Left lung remains clear. IMPRESSION: Reduction in right-sided  pleural effusion following thoracentesis. No pneumothorax is noted. Electronically Signed   By: Inez Catalina M.D.   On: 01/29/2018 11:11   Dg Chest 1 View  Result Date: 01/26/2018 CLINICAL DATA:  Status post right thoracentesis. EXAM: CHEST  1 VIEW COMPARISON:  01/26/2018 FINDINGS: Loculated right pleural effusion. No evidence of pneumothorax post thoracentesis. Chronic elevation of left hemidiaphragm. Stably enlarged cardiac silhouette. Mediastinal contours appear intact. 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 Low lung volumes. Lateral humeral prosthesis, partially visualized. Soft tissues are grossly normal. IMPRESSION: Loculated right pleural effusion. No evidence of pneumothorax post thoracentesis. Stably enlarged cardiac silhouette. Electronically Signed   By: Fidela Salisbury M.D.   On: 01/26/2018 13:13   Dg Chest 1 View  Result Date: 01/22/2018 CLINICAL DATA:  Post right thoracentesis EXAM: CHEST  1 VIEW COMPARISON:  01/21/2018 FINDINGS: Cardiac shadow is stable. Pacing device is again seen and stable. Left lung is clear. Small right-sided pleural effusion is noted. No pneumothorax is noted following thoracentesis. No acute bony abnormality is noted. Postsurgical changes are noted in the shoulder joints bilaterally. IMPRESSION: No pneumothorax following right thoracentesis. Minimal residual effusion is noted. Electronically Signed   By: Inez Catalina M.D.   On: 01/22/2018 15:08   Dg Chest 2 View  Result Date: 02/07/2018 CLINICAL DATA:  Evaluate for pneumothorax. Recent VATS procedure with pleural biopsy and talc pleurodesis. EXAM: CHEST - 2 VIEW COMPARISON:  02/05/2018 FINDINGS: There continues to be a large amount of subcutaneous gas  throughout the right side of the chest. The amount of subcutaneous gas is similar to the recent comparison examination. Lateral view demonstrates a small gas-filled collection along the posterior aspect of the right chest. This may represent a small  focus of loculated pleural air. No large pneumothorax. Patchy pleural and parenchymal densities throughout the right chest have minimally changed. Left lung appears to be clear. Difficult to exclude a small left effusion. Heart size is within normal limits and stable. Dual chamber cardiac pacemaker is again noted but the leads are incompletely visualized. Right arm PICC line tip in the lower SVC and stable. There are bilateral shoulder replacements. IMPRESSION: Scattered pleural and parenchymal densities throughout the right chest have minimally changed. Evidence for a small gas collection in the posterior right chest which may represent loculated pleural air. Overall, there is not a large pneumothorax. Stable subcutaneous emphysema along the right side of the chest and neck. Electronically Signed   By: Markus Daft M.D.   On: 02/07/2018 09:00   Dg Chest 2 View  Result Date: 01/21/2018 CLINICAL DATA:  77 year old male with recent pneumonia. Hypotension and weakness. History of renal cell carcinoma. EXAM: CHEST - 2 VIEW COMPARISON:  Chest radiograph 01/19/2018 and earlier. Chest CTA 12/26/2017. FINDINGS: Persistent small right pleural effusion with fluid tracking into the right minor fissure. Stable lung volumes. Mediastinal contours remain normal. Visualized tracheal air column is within normal limits. Stable left chest cardiac pacemaker. No pneumothorax, pulmonary edema, consolidation, or new pulmonary opacity. Stable visualized osseous structures. Bilateral humerus arthroplasty. Negative visible bowel gas pattern. IMPRESSION: 1. Persistent small right pleural effusion with fluid tracking into the minor fissure. 2. No other acute cardiopulmonary abnormality. Electronically Signed   By: Genevie Ann M.D.   On: 01/21/2018 12:33   Ct Angio Chest Pe W/cm &/or Wo Cm  Result Date: 01/21/2018 CLINICAL DATA:  77 year old male with acute shortness of breath and hypotension today. History of LEFT renal carcinoma and LEFT  nephrectomy in 2019. EXAM: CT ANGIOGRAPHY CHEST WITH CONTRAST TECHNIQUE: Multidetector CT imaging of the chest was performed using the standard protocol during bolus administration of intravenous contrast. Multiplanar CT image reconstructions and MIPs were obtained to evaluate the vascular anatomy. CONTRAST:  42mL ISOVUE-370 IOPAMIDOL (ISOVUE-370) INJECTION 76% COMPARISON:  01/21/2018 chest radiograph, 12/26/2017 chest CT and prior studies FINDINGS: Cardiovascular: This is a technically adequate study but respiratory motion artifact decreases sensitivity, specially in the mid and LOWER lungs. No pulmonary emboli are identified. Mild cardiomegaly noted. Aortic valvular, coronary artery and thoracic aortic calcifications again noted. There is no evidence of thoracic aortic aneurysm or pericardial effusion. Mediastinum/Nodes: New RIGHT internal mammary lymph nodes are identified, the largest measuring 8 mm in short axis) series 5: Image 75). No other abnormal lymph nodes identified. No mediastinal mass.  The visualized thyroid gland is unremarkable. Lungs/Pleura: A large RIGHT pleural effusion has increased size and 12/26/2017. Pleural/chest wall nodules are now better visualized with index posterior nodule measuring 1.5 x 2.5 cm (5:90). 5 mm nodules within the RIGHT middle lobe (6: 72 and 79) and SUPERIOR segment LEFT LOWER lobe (6:36) are identified. RIGHT mid-LOWER lung atelectasis and minimal LEFT basilar atelectasis/scarring again noted. There is no evidence of pneumothorax. Upper Abdomen: A new 2 x 2.3 cm RIGHT adrenal nodule is noted. Low-density lesion within the RIGHT liver is unchanged. Musculoskeletal: No acute or definite focal bony lesions identified. Review of the MIP images confirms the above findings. IMPRESSION: 1. Large RIGHT pleural effusion with RIGHT pleural/chest wall  nodules. New RIGHT internal mammary lymphadenopathy, small pulmonary nodules and 2.3 cm RIGHT adrenal nodule are highly suspicious  for metastatic disease. 2. No evidence of pulmonary emboli 3. Mild cardiomegaly and aortic valvular calcifications. 4. Coronary artery and Aortic Atherosclerosis (ICD10-I70.0). Electronically Signed   By: Margarette Canada M.D.   On: 01/21/2018 14:55   Dg Chest Port 1 View  Result Date: 02/05/2018 CLINICAL DATA:  Encounter for SOB EXAM: PORTABLE CHEST - 1 VIEW COMPARISON:  Earlier film the same day FINDINGS: Extensive right lateral and neck subcutaneous emphysema as before. No pneumothorax evident. Right lateral pleural thickening or loculated effusion. Adjacent consolidation/atelectasis in the mid and lower right lung stable. Left lung clear. Heart size normal. Aortic Atherosclerosis (ICD10-170.0). Right arm PICC line to the cavoatrial junction. Left subclavian transvenous pacemaker stable. Bilateral shoulder arthroplasty hardware partially seen. IMPRESSION: Stable appearance since earlier film of the same day. Electronically Signed   By: Lucrezia Europe M.D.   On: 02/05/2018 12:48   Dg Chest Port 1 View  Result Date: 02/05/2018 CLINICAL DATA:  Status post thoracotomy. EXAM: PORTABLE CHEST 1 VIEW COMPARISON:  February 04, 2018 FINDINGS: Air in the right chest wall is stable. No pneumothorax. Opacity in the right base with associated effusion is stable. The left lung is clear. The cardiomediastinal silhouette is unchanged. A right PICC line is stable. The right chest tube has been removed. IMPRESSION: 1. Removal of right chest tube.  Stable right PICC line. 2. Stable air in the right chest wall. 3. Stable effusion and underlying opacity on the right. No right-sided pneumothorax noted. Electronically Signed   By: Dorise Bullion III M.D   On: 02/05/2018 07:42   Dg Chest Port 1 View  Result Date: 02/04/2018 CLINICAL DATA:  77 year old male with right video assisted thoracotomy with biopsy of pleural tumors, evacuation of hemothorax, and talc pleurodesis performed 02/01/2018. 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 Unchanged cardiac pacing device. Right upper extremity PICC is unchanged. Right thoracostomy tube unchanged, terminating at the apex. No visualized right pneumothorax. Similar appearance of patchy opacities of the mid right lung and lower right lung with blunting of the right costophrenic sulcus. Similar appearance of extensive right subcutaneous and myofacial emphysema extending to the base of the neck. Left lung relatively well aerated. Surgical changes of the shoulders. IMPRESSION: Essentially unchanged chest x-ray, with unchanged position of right thoracostomy tube and no visualized pneumothorax. Similar appearance of right-sided airspace disease and trace right pleural fluid/tissue. Unchanged right upper extremity PICC. Electronically Signed   By: Corrie Mckusick D.O.   On: 02/04/2018 11:44   Dg Chest Port 1 View  Result Date: 02/03/2018 CLINICAL DATA:  Status post thoracotomy EXAM: PORTABLE CHEST 1 VIEW COMPARISON:  02/02/2018 FINDINGS: Interval extubation.  LEFT lung clear. Chest tube remains in the RIGHT lung. There is atelectasis and fluid along the RIGHT lateral chest wall. Volume loss. No discernible pneumothorax. Extensive subcutaneous gas along the RIGHT chest wall is increased and now extending into the RIGHT neck region. PICC line unchanged.  Removal of NG tube. IMPRESSION: 1. Increase in subcutaneous gas along the RIGHT chest wall which now extends into the neck region. 2. Postsurgical change in the RIGHT hemithorax with chest tube without significant change. 3. Extubation without complication. These results will be called to the ordering clinician or representative by the Radiologist Assistant, and communication documented in the  PACS or zVision Dashboard. Electronically Signed   By: Suzy Bouchard M.D.   On: 02/03/2018 11:22   Dg Chest  Port 1 View  Result Date: 02/02/2018 CLINICAL DATA:  Chest tube with pleural effusion. EXAM: PORTABLE CHEST 1 VIEW COMPARISON:  Yesterday FINDINGS: Right apical chest tube in stable position. Hazy opacity in the right mid and lower lung is unchanged. Lucency at the right costophrenic sulcus is likely pneumothorax. Extensive chest wall emphysema on the right is stable. Haziness of the lower left chest is likely pleural fluid and atelectasis. Endotracheal tube tip is at the clavicular heads. An orogastric tube reaches the stomach. Stable dual-chamber pacer leads from the left.  Cardiomegaly. IMPRESSION: 1. Stable right chest tube positioning and hazy right base opacity. Probable basal pneumothorax on the right. 2. Atelectasis and pleural fluid on the left. 3. Stable hardware positioning. Electronically Signed   By: Monte Fantasia M.D.   On: 02/02/2018 09:54   Dg Chest Port 1 View  Result Date: 02/01/2018 CLINICAL DATA:  Orogastric tube placement. EXAM: PORTABLE CHEST 1 VIEW COMPARISON:  Radiograph of same day. FINDINGS: Stable cardiomediastinal silhouette. Endotracheal tube is in grossly good position. Interval placement of orogastric tube with tip in expected position of proximal stomach. Left-sided pacemaker is unchanged in position. Right-sided chest tube is again noted without definite pneumothorax. Stable right basilar subsegmental atelectasis is noted probable with small loculated right pleural effusion. Stable subcutaneous emphysema is seen over right lateral chest wall. Stable left basilar atelectasis and possible effusion is noted. Status post bilateral shoulder arthroplasties. IMPRESSION: Endotracheal and orogastric tube are in grossly good position. Stable position of right-sided chest tube is noted without definite pneumothorax. Stable right basilar subsegmental atelectasis is noted with probable small loculated right pleural effusion Electronically Signed   By: Marijo Conception, M.D.   On: 02/01/2018  19:42   Dg Chest Port 1 View  Result Date: 02/01/2018 CLINICAL DATA:  S/P thoracotomy EXAM: PORTABLE CHEST 1 VIEW COMPARISON:  01/29/2018 and CT 01/21/2017 FINDINGS: Endotracheal tube is 4.9 centimeters above the carina. The patient has a RIGHT-sided chest tube, tip overlying the RIGHT lung apex. There is focal pleural based opacity at the RIGHT mid lung zone. Decreased RIGHT pleural effusion. There is moderate RIGHT chest wall emphysema. Suspect RIGHT basilar pneumothorax, probably small. The LEFT lung is clear. Shallow inflation. IMPRESSION: 1. Postoperative changes. 2. Moderate RIGHT subcutaneous emphysema. Suspect small RIGHT basilar pneumothorax. 3. Smaller RIGHT pleural effusion. Electronically Signed   By: Nolon Nations M.D.   On: 02/01/2018 11:42   Dg Chest Port 1 View  Result Date: 01/28/2018 CLINICAL DATA:  Short of breath EXAM: PORTABLE CHEST 1 VIEW COMPARISON:  01/26/2018 FINDINGS: Progressive right pleural effusion and right lower lobe airspace disease. Left lung remains clear. Heart size and vascularity normal. Bilateral shoulder replacement.  Dual lead pacemaker unchanged. IMPRESSION: Interval progression of right lower lobe effusion and right lower lobe airspace disease. Electronically Signed   By: Franchot Gallo M.D.   On: 01/28/2018 11:24   Dg Chest Port 1 View  Result Date: 01/26/2018 CLINICAL DATA:  Shortness of Breath EXAM: PORTABLE CHEST 1 VIEW COMPARISON:  01/22/2018 FINDINGS: Left pacer remains in place, unchanged. Right base atelectasis with small right effusion. No confluent opacity on the left. Heart is normal size. IMPRESSION: Small right effusion with right base atelectasis. Electronically Signed   By: Rolm Baptise M.D.   On: 01/26/2018 07:36   [place] Ekg Site Rite  Result Date: 02/02/2018 If Site  Rite image not attached, placement could not be confirmed due to current cardiac rhythm.  Ir Thoracentesis Asp Pleural Space W/img Guide  Result Date: 01/26/2018 INDICATION:  Recurrent right sided pleural effusion with increase dyspnea. EXAM: ULTRASOUND GUIDED RIGHT THORACENTESIS MEDICATIONS: 10 mL 2% lidocaine. COMPLICATIONS: None immediate. PROCEDURE: An ultrasound guided thoracentesis was thoroughly discussed with the patient and questions answered. The benefits, risks, alternatives and complications were also discussed. The patient understands and wishes to proceed with the procedure. Written consent was obtained. Ultrasound was performed to localize and mark an adequate pocket of fluid in the right chest. The area was then prepped and draped in the normal sterile fashion. 2% Lidocaine was used for local anesthesia. Under ultrasound guidance a 6 Fr Safe-T-Centesis catheter was introduced. Thoracentesis was performed. The catheter was removed and a dressing applied. FINDINGS: A total of approximately 2.5L of bloody fluid was removed. Samples were sent to the laboratory as requested by the clinical team. IMPRESSION: Successful ultrasound guided right thoracentesis yielding of pleural fluid. Read by Candiss Norse, PA-C Electronically Signed   By: Aletta Edouard M.D.   On: 01/26/2018 13:25   US Thoracentesis Asp Pleural Space W/img Guide  Result Date: 01/29/2018 INDICATION: Patient with history of shortness of breath and recurrence right pleural effusion. Request is made for diagnostic and therapeutic right thoracentesis. EXAM: ULTRASOUND GUIDED DIAGNOSTIC AND THERAPEUTIC RIGHT THORACENTESIS MEDICATIONS: 10 mL of 2% lidocaine COMPLICATIONS: None immediate. PROCEDURE: An ultrasound guided thoracentesis was thoroughly discussed with the patient and questions answered. The benefits, risks, alternatives and complications were also discussed. The patient understands and wishes to proceed with the procedure. Written consent was obtained. Ultrasound was performed to localize and mark an adequate pocket of fluid in the right chest. The area was then prepped and draped in the normal  sterile fashion. 2% Lidocaine was used for local anesthesia. Under ultrasound guidance a 6 Fr Safe-T-Centesis catheter was introduced. Thoracentesis was performed. The catheter was removed and a dressing applied. FINDINGS: A total of approximately 800 mL of blood-tinged fluid was removed. Samples were sent to the laboratory as requested by the clinical team. IMPRESSION: Successful ultrasound guided right thoracentesis yielding 800 mL of pleural fluid. Read by: Earley Abide, PA-C Electronically Signed   By: Jacqulynn Cadet M.D.   On: 01/29/2018 11:34   US Thoracentesis Asp Pleural Space W/img Guide  Result Date: 01/22/2018 INDICATION: Three of left renal cell cancer. Large right pleural effusion. Request for diagnostic and therapeutic thoracentesis. EXAM: ULTRASOUND GUIDED RIGHT THORACENTESIS MEDICATIONS: 1% lidocaine 10 mL COMPLICATIONS: None immediate. PROCEDURE: An ultrasound guided thoracentesis was thoroughly discussed with the patient and questions answered. The benefits, risks, alternatives and complications were also discussed. The patient understands and wishes to proceed with the procedure. Written consent was obtained. Ultrasound was performed to localize and mark an adequate pocket of fluid in the right chest. The area was then prepped and draped in the normal sterile fashion. 1% Lidocaine was used for local anesthesia. Under ultrasound guidance a 6 Fr Safe-T-Centesis catheter was introduced. Thoracentesis was performed. The catheter was removed and a dressing applied. FINDINGS: A total of approximately 2 L of blood was removed. Samples were sent to the laboratory as requested by the clinical team. IMPRESSION: Successful ultrasound guided right thoracentesis yielding 2 L of pleural fluid. Read by: Gareth Eagle, PA-C Electronically Signed   By: Aletta Edouard M.D.   On: 01/22/2018 14:48     Discharge Medications: Allergies as of 02/07/2018  Reactions   Penicillins Rash   Has patient had  a PCN reaction causing immediate rash, facial/tongue/throat swelling, SOB or lightheadedness with hypotension: No Has patient had a PCN reaction causing severe rash involving mucus membranes or skin necrosis: No Has patient had a PCN reaction that required hospitalization: No Has patient had a PCN reaction occurring within the last 10 years: No If all of the above answers are "NO", then may proceed with Cephalosporin use.      Medication List    STOP taking these medications   diazepam 5 MG tablet Commonly known as:  VALIUM   doxycycline 100 MG capsule Commonly known as:  VIBRAMYCIN   FERROUS SULFATE CR PO   LORazepam 0.5 MG tablet Commonly known as:  ATIVAN   METOPROLOL SUCCINATE PO     TAKE these medications   acetaminophen 500 MG tablet Commonly known as:  TYLENOL Take 500 mg by mouth daily.   diclofenac sodium 1 % Gel Commonly known as:  VOLTAREN Apply 4 g topically as needed (joint pain).   dronedarone 400 MG tablet Commonly known as:  MULTAQ Take 0.5 tablets (200 mg total) by mouth 2 (two) times daily with a meal. What changed:    medication strength  how much to take  when to take this   fluticasone 50 MCG/ACT nasal spray Commonly known as:  FLONASE Place 2 sprays into both nostrils at bedtime.   IRON PO Take by mouth.   lipase/protease/amylase 36000 UNITS Cpep capsule Commonly known as:  CREON Take 2 capsules (72,000 Units total) by mouth 2 (two) times daily before lunch and supper.   loperamide 2 MG capsule Commonly known as:  IMODIUM Take 2 mg by mouth as needed.   megestrol 400 MG/10ML suspension Commonly known as:  MEGACE Take 10 mLs (400 mg total) by mouth 2 (two) times daily.   metoprolol tartrate 25 MG tablet Commonly known as:  LOPRESSOR Take 0.5 tablets (12.5 mg total) by mouth 2 (two) times daily.   pantoprazole 40 MG tablet Commonly known as:  PROTONIX Take 40 mg by mouth daily.   tamsulosin 0.4 MG Caps capsule Commonly known  as:  FLOMAX Take 0.4 mg by mouth daily after supper.   traMADol 50 MG tablet Commonly known as:  ULTRAM Take 1 tablet (50 mg total) by mouth every 6 (six) hours as needed (mild pain).   vitamin B-6 25 MG tablet Commonly known as:  pyridOXINE Take 25 mg by mouth 2 (two) times daily.       Follow Up Appointments:  Contact information for follow-up providers    Gaye Pollack, MD. Go on 02/23/2018.   Specialty:  Cardiothoracic Surgery Why:  PA/LAT CXR to be taken (at [facility] which is in the same building as Dr. Vivi Martens office) on 02/23/2018 at 11:30 am;Appointment time is at 12:00 pm  Contact information: [address] [place] [address] [phone]        Volanda Napoleon, MD. Call in 1 day(s).   Specialty:  Oncology Why:  Please call and schedule an appointment  Contact information: [address] [place] [address] [phone]        [name], PA-C. Schedule an appointment as soon as possible for a visit.   Specialty:  Physician Assistant Why:  Upon discharge from SNF. Contact information: [address] [place] [address] [phone]        MD at SNF. Schedule an  appointment as soon as possible for a visit in 3 day(s).   Why:  To be seen with repeat labs (CBC & BMP).       Mahala Menghini, MD. Call.   Specialty:  Cardiology Why:  for a follow up appointment for 1-2 weeks Contact information: [address] [place] [address] [phone]            Contact information for after-discharge care    Destination    [facility] Preferred SNF .   Service:  Skilled Nursing Contact information: [place] [phone]                  Signed: Sharalyn Ink [facility] 02/07/2018, 2:50 PM

## 2018-02-04 NOTE — Progress Notes (Signed)
PROGRESS NOTE    Barry Curry  MWU:[id number] DOB: [birth year] DOA: 01/21/2018 PCP: [name], PA-C    Brief Narrative:  Barry Curry a 77 y.o.malewith medical history significant ofhypertension, hyperlipidemia, GERD, depression, anxiety, atrial fibrillation not on anticoagulant, CAD, PVC, aortic stenosis, RBBB, iron deficiency anemia, CKD, clear renal cell cancer (left nephrectomy), melanoma, OSA who presented with shortness of breath. 3 weeks prior patient had a right thoracentesis at [name] with 670 cc of bloody fluid removed. During this admission on 01/22/2018 patient had a ultrasound-guided right thoracentesis with 2 L of bloody fluid removed. Pleural fluid sent for Gram stain, culture, cytology. Pulmonology consulted to assess for recurrence of pleural effusion. Exudative pleural fluid noted. He underwent repeat thoracentesis on 7/31 for recurrent pleural effusion, with cytology showing reactive mesothelial cells.  TCTS consulted and patient underwent right video-assisted thoracoscopy, biopsy of pleural masses and talc pleurodesis 8/6.  He was extubated postprocedure but then became very anxious and tachypneic for which she was put on low-dose Precedex for pain control but became obtunded and hypercapnic for which she was reintubated and admitted to ICU.  Extubated 8/7.  Pleural fluid 8/3 shows atypical cells.  Pleural mass biopsy consistent with metastatic renal cell carcinoma.   Assessment & Plan:   Principal Problem:   Recurrent right pleural effusion Active Problems:   Essential hypertension   GERD   Acute respiratory failure with hypoxia (HCC)   PAF (paroxysmal atrial fibrillation) (HCC)   Clear cell carcinoma of kidney, left (HCC)   Anxiety   CAD (coronary artery disease)   Iron deficiency anemia   Elevated troponin   CKD (chronic kidney disease), stage III (HCC)   Malnutrition of moderate degree   S/P thoracotomy   Acute respiratory  failure with hypoxia secondary to recurrent pleural effusion, possibly malignant effusion  - IR guided thoracentesis 4 times in the last one month. - exudative, gram stain negative, culture negative and cytology showing reactive mesothelial cells on 7/31 but atypical cells on 8/3. -Post VATS required reintubation on 8/6 and admission to ICU.  Extubated 8/7 and saturating normally on room air.  Incentive spirometry.  Avoid narcotics (sensitive). - He is afebrile and has persistent leukocytosis.  - Pro calcitonin level is negative, indicative of non infective process but in view of the consolidative process and persistent leukocytosis and reactive mesothelial cell on cytology, as per discussion with ID, completed 5 days of IV cefepime for possible HCAP.  Pleural culture results from 7/27: Negative. - TCTS consulted and patient underwent right video-assisted thoracoscopy, biopsy of pleural masses and talc pleurodesis 8/6.  Pathology results pending.  Right chest tube in place. -Overnight 8/8, developed worsening dyspnea, received a dose of IV Lasix, did not tolerate BiPAP.  Dyspnea has significantly improved this morning.  8/9 personally reviewed, noted subcutaneous gas extending up to the neck, chest tube in place, small right pleural effusion. -As per TCTS follow-up, plans are for chest tube to be removed after blood transfusion and a dose of Lasix.  Incentive spirometry encouraged.  Paroxysmal atrial fibrillation:  On dronedarone.  Metoprolol currently on hold.  Likely not anticoagulation candidate due to hemorrhagic pleural effusion.  Resumed metoprolol 8/8.  Mild sinus tachycardia in the 110s-130s likely multifactorial related to dyspnea, anemia and some discomfort from chest tube.  May consider increasing metoprolol if does not improve with addressing above causes.  Iron deficiency anemia/ acute blood loss anemia:  IV iron and aranesp ordered by oncology.  He  underwent 2 units of prbc transfusion  prior.  Hemoglobin down to 8.3 on 8/9.  Getting additional unit PRBC with Lasix.  Follow CBC in a.m.  Essential hypertension:  Controlled.  CAD:  no chest pain or sob. Resumed BB.   GERD  stable.   Mild Acute on Stage 3 CKD.  Creatinine has normalized in the 1.1 range for the last 2 days.  Follow BMP in a.m.  Leukocytosis:  - suspect reactive. Pro calcitonin is negative.  Afebrile, no dysuria symptoms. Urine culture report grew 20,000 pseudomonas.  Blood cultures negative, final report. Pleural fluid culture negative, final report.  Cytology from pleural fluid analysis shows reactive mesothelial cells on initial testing 7/31 and atypical cells on 8/3. Leukocytosis stable.  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  Dr Marin Olp on board and as per his follow-up 8/9, pleural biopsy pathology came back positive for metastatic renal cell carcinoma.  Management per oncology.  Moderate Protein Calorie malnutrition:  - nutrition consultation and input appreciated.  Continue nutritional supplements.   DVT prophylaxis: SCD'S Code Status: full code.  Family Communication: Discussed in detail with patient's sister at bedside.  Updated care and answered questions. Disposition Plan: Patient was transferred to ICU post procedure on 8/6.  Continue management in ICU.  Consultants:   Oncology  Tcts.   Procedures:  IR thoracentesis on 7/31,  8/6 VATS, with pleural biopsy, talc pleurodesis.  Right chest tube. Foley catheter -DC'd 8/8 Arterial line DC'd 8/8. Oral ET tube-discontinued 8/7. RUE PICC line 8/7>  Antimicrobials: cefepime since 8/2 >8/6  Subjective: Overnight events noted.  Discussed with patient, sister and RN at bedside.  Developed dyspnea overnight, received a dose of IV Lasix, did not tolerate BiPAP.  Since then dyspnea has improved but breathing not yet at baseline.  No chest pain.  Week.  Appetite down again.  Urinating well  after removal of Foley catheter.  Objective: Vitals:   02/04/18 0635 02/04/18 0640 02/04/18 0744 02/04/18 0800  BP: 107/61   106/72  Pulse: (!) 120   (!) 55  Resp: (!) 27   (!) 39  Temp: 98.1 F (36.7 C)   98.7 F (37.1 C)  TempSrc: Oral   Oral  SpO2: 98%  96% 95%  Weight:  94.4 kg    Height:        Intake/Output Summary (Last 24 hours) at 02/04/2018 1042 Last data filed at 02/04/2018 1000 Gross per 24 hour  Intake 680 ml  Output 2160 ml  Net -1480 ml   Filed Weights   01/21/18 1107 02/04/18 0640  Weight: 92.1 kg 94.4 kg    Examination:  General exam: Pleasant elderly male, moderately built and nourished lying comfortably propped up in bed.  Looks slightly worn out. Respiratory system: Diminished breath sounds in the right base.  Few scattered crackles in the right lung fields, most likely related to subcutaneous air.  Slightly diminished breath sounds in the left base.  Right-sided chest tube draining bloody fluid.  No increased work of breathing. Cardiovascular system: S1 and S2 heard, regular and mild tachycardia.  No JVD.  Short systolic murmur 2/6 best heard at apex..  No lower extremity edema.  Trace bilateral upper extremity edema but improving.  Telemetry personally reviewed: Sinus tachycardia intermittently in the 110s-130s/sinus rhythm. Gastrointestinal system: Abdomen is nondistended, soft and nontender.  Normal bowel sounds heard.  No organomegaly or masses appreciated.  Stable Central nervous system: Alert and oriented.  No  focal neurological deficits.  Stable Extremities: Moves all extremities symmetrically. Skin: No rashes, lesions or ulcers Psychiatry: Flat affect.    Data Reviewed: I have personally reviewed following labs and imaging studies  CBC: Recent Labs  Lab 01/29/18 0215 01/30/18 0203 01/31/18 0255 02/01/18 0849 02/01/18 1115 02/02/18 0417 02/03/18 0400 02/04/18 0306  WBC 18.8* 16.8* 14.0*  --  25.5* 24.9* 17.0* 17.8*  NEUTROABS 15.3* 13.8*  11.7*  --   --   --   --   --   HGB 10.9* 10.6* 10.8* 10.5* 9.7* 9.1* 8.0* 8.3*  HCT 34.1* 32.6* 34.0* 31.0* 30.7* 28.4* 24.9* 26.7*  MCV 86.1 85.3 85.2  --  88.7 86.3 86.2 87.5  PLT 205 216 232  --  256 246 178 878   Basic Metabolic Panel: Recent Labs  Lab 01/31/18 0255 02/01/18 0302 02/01/18 0849 02/01/18 1151 02/02/18 0417 02/03/18 0400  NA 138 136 138 138 138 140  K 4.0 4.3 4.6 4.4 4.6 4.3  CL 107 102  --  108 106 110  CO2 24 23  --  21* 20* 22  GLUCOSE 91 85  --  152* 125* 91  BUN 24* 20  --  21 22 24*  CREATININE 1.47* 1.49*  --  1.33* 1.17 1.17  CALCIUM 8.1* 8.2*  --  7.7* 7.9* 7.4*   GFR: Estimated Creatinine Clearance: 63 mL/min (by C-G formula based on SCr of 1.17 mg/dL). Liver Function Tests: Recent Labs  Lab 01/30/18 0203 01/31/18 0255 02/01/18 0302 02/03/18 0400  AST 17 21 25 31   ALT 14 16 19 17   ALKPHOS 69 70 80 56  BILITOT 0.8 0.8 0.9 0.7  PROT 4.9* 5.0* 5.6* 4.1*  ALBUMIN 2.1* 2.1* 2.3* 1.7*   CBG: Recent Labs  Lab 02/03/18 1549 02/03/18 1934 02/03/18 2342 02/04/18 0403 02/04/18 0749  GLUCAP 86 96 90 83 73      Radiology Studies: Dg Chest Port 1 View  Result Date: 02/03/2018 CLINICAL DATA:  Status post thoracotomy EXAM: PORTABLE CHEST 1 VIEW COMPARISON:  02/02/2018 FINDINGS: Interval extubation.  LEFT lung clear. Chest tube remains in the RIGHT lung. There is atelectasis and fluid along the RIGHT lateral chest wall. Volume loss. No discernible pneumothorax. Extensive subcutaneous gas along the RIGHT chest wall is increased and now extending into the RIGHT neck region. PICC line unchanged.  Removal of NG tube. IMPRESSION: 1. Increase in subcutaneous gas along the RIGHT chest wall which now extends into the neck region. 2. Postsurgical change in the RIGHT hemithorax with chest tube without significant change. 3. Extubation without complication. These results will be called to the ordering clinician or representative by the Radiologist Assistant,  and communication documented in the PACS or zVision Dashboard. Electronically Signed   By: Suzy Bouchard M.D.   On: 02/03/2018 11:22   [place] Ekg Site Rite  Result Date: 02/02/2018 If Site Rite image not attached, placement could not be confirmed due to current cardiac rhythm.       Scheduled Meds: . sodium chloride   Intravenous Once  . Chlorhexidine Gluconate Cloth  6 each Topical Daily  . dronedarone  400 mg Oral BID WC  . feeding supplement (ENSURE ENLIVE)  237 mL Oral TID BM  . ferrous sulfate  325 mg Oral Q breakfast  . fluticasone  2 spray Each Nare QHS  . folic acid  2 mg Oral Daily  . furosemide  40 mg Intravenous Once  . [START ON 02/05/2018] furosemide  40 mg Oral Daily  .  levalbuterol  0.63 mg Nebulization TID  . lipase/protease/amylase  72,000 Units Oral BID AC  . mouth rinse  15 mL Mouth Rinse BID  . megestrol  400 mg Oral BID  . metoprolol tartrate  12.5 mg Oral BID  . pantoprazole  40 mg Oral Daily  . potassium chloride  20 mEq Oral BID  . senna-docusate  1 tablet Oral QHS  . senna-docusate  2 tablet Oral BID  . sodium chloride flush  10-40 mL Intracatheter Q12H  . tamsulosin  0.4 mg Oral QPC supper  . vitamin B-6  25 mg Oral BID   Continuous Infusions: . potassium chloride       LOS: 13 days    Time spent: 25 minutes.    Vernell Leep, MD, FACP, [facility]. [facility] Pager [phone]  If 7PM-7AM, please contact night-coverage [email or web address] Password [email or web address] 02/04/2018, 10:42 AM

## 2018-02-04 NOTE — Plan of Care (Signed)
  Problem: Clinical Measurements: Goal: Respiratory complications will improve Outcome: Progressing  Improving after Lasix and repositioning in bed Problem: Activity: Goal: Risk for activity intolerance will decrease Outcome: Progressing   Problem: Coping: Goal: Level of anxiety will decrease Outcome: Progressing   Problem: Elimination: Goal: Will not experience complications related to urinary retention Outcome: Progressing  Pt voiding in urinal- Given Lasix 40 mg IV Problem: Pain Managment: Goal: General experience of comfort will improve Outcome: Progressing  No pain reported

## 2018-02-05 ENCOUNTER — Inpatient Hospital Stay (HOSPITAL_COMMUNITY): Payer: Medicare Other

## 2018-02-05 LAB — CBC
HCT: 28.2 % — ABNORMAL LOW (ref 39.0–52.0)
HEMOGLOBIN: 9.1 g/dL — AB (ref 13.0–17.0)
MCH: 27.2 pg (ref 26.0–34.0)
MCHC: 32.3 g/dL (ref 30.0–36.0)
MCV: 84.4 fL (ref 78.0–100.0)
PLATELETS: 184 10*3/uL (ref 150–400)
RBC: 3.34 MIL/uL — ABNORMAL LOW (ref 4.22–5.81)
RDW: 18.8 % — ABNORMAL HIGH (ref 11.5–15.5)
WBC: 18.2 10*3/uL — ABNORMAL HIGH (ref 4.0–10.5)

## 2018-02-05 LAB — BPAM RBC
Blood Product Expiration Date: 201908312359
ISSUE DATE / TIME: 201908091055
Unit Type and Rh: 6200

## 2018-02-05 LAB — BASIC METABOLIC PANEL
Anion gap: 7 (ref 5–15)
BUN: 27 mg/dL — ABNORMAL HIGH (ref 8–23)
CO2: 25 mmol/L (ref 22–32)
CREATININE: 1.35 mg/dL — AB (ref 0.61–1.24)
Calcium: 7.9 mg/dL — ABNORMAL LOW (ref 8.9–10.3)
Chloride: 105 mmol/L (ref 98–111)
GFR calc Af Amer: 57 mL/min — ABNORMAL LOW (ref >60)
GFR calc non Af Amer: 49 mL/min — ABNORMAL LOW (ref >60)
GLUCOSE: 90 mg/dL (ref 70–99)
Potassium: 4.7 mmol/L (ref 3.5–5.1)
Sodium: 137 mmol/L (ref 135–145)

## 2018-02-05 LAB — TYPE AND SCREEN
ABO/RH(D): A POS
ANTIBODY SCREEN: NEGATIVE
UNIT DIVISION: 0

## 2018-02-05 LAB — GLUCOSE, CAPILLARY
GLUCOSE-CAPILLARY: 90 mg/dL (ref 70–99)
Glucose-Capillary: 80 mg/dL (ref 70–99)
Glucose-Capillary: 146 mg/dL — ABNORMAL HIGH (ref 70–99)

## 2018-02-05 MED ORDER — LEVALBUTEROL HCL 0.63 MG/3ML IN NEBU: 0.6300 mg | INHALATION_SOLUTION | Freq: Four times a day (QID) | RESPIRATORY_TRACT | Status: DC | PRN

## 2018-02-05 MED ORDER — MAGNESIUM SULFATE 2 GM/50ML IV SOLN
2.0000 g | Freq: Once | INTRAVENOUS | Status: AC
  Administered 2018-02-05: 2 g via INTRAVENOUS
  Filled 2018-02-05: qty 50

## 2018-02-05 MED ORDER — FUROSEMIDE 10 MG/ML IJ SOLN
40.0000 mg | Freq: Two times a day (BID) | INTRAMUSCULAR | Status: DC
  Administered 2018-02-05 – 2018-02-06 (×2): 40 mg via INTRAVENOUS
  Filled 2018-02-05 (×2): qty 4

## 2018-02-05 NOTE — Plan of Care (Signed)
  Problem: Clinical Measurements: Goal: Respiratory complications will improve Outcome: Progressing  Pt breathing well on Ra, at 97% 02  Problem: Nutrition: Goal: Adequate nutrition will be maintained Outcome: Progressing   Problem: Elimination: Goal: Will not experience complications related to bowel motility Outcome: Progressing Goal: Will not experience complications related to urinary retention Outcome: Progressing   Problem: Pain Managment: Goal: General experience of comfort will improve Outcome: Progressing   Problem: Safety: Goal: Ability to remain free from injury will improve Outcome: Progressing   Problem: Safety: Goal: Ability to remain free from injury will improve Outcome: Progressing

## 2018-02-05 NOTE — Progress Notes (Signed)
PROGRESS NOTE    Barry Curry  WUJ:[id number] DOB: 1941-01-08 DOA: 01/21/2018 PCP: [name], PA-C    Brief Narrative:  Barry Curry a 77 y.o.malewith medical history significant ofhypertension, hyperlipidemia, GERD, depression, anxiety, atrial fibrillation not on anticoagulant, CAD, PVC, aortic stenosis, RBBB, iron deficiency anemia, CKD, clear renal cell cancer (left nephrectomy), melanoma, OSA who presented with shortness of breath. 3 weeks prior patient had a right thoracentesis at [facility] with 670 cc of bloody fluid removed. During this admission on 01/22/2018 patient had a ultrasound-guided right thoracentesis with 2 L of bloody fluid removed. Pleural fluid sent for Gram stain, culture, cytology. Pulmonology consulted to assess for recurrence of pleural effusion. Exudative pleural fluid noted. He underwent repeat thoracentesis on 7/31 for recurrent pleural effusion, with cytology showing reactive mesothelial cells.  TCTS consulted and patient underwent right video-assisted thoracoscopy, biopsy of pleural masses and talc pleurodesis 8/6.  He was extubated postprocedure but then became very anxious and tachypneic for which she was put on low-dose Precedex for pain control but became obtunded and hypercapnic for which she was reintubated and admitted to ICU.  Extubated 8/7.  Pleural fluid 8/3 shows atypical cells.  Pleural mass biopsy consistent with metastatic renal cell carcinoma.   Assessment & Plan:   Principal Problem:   Recurrent right pleural effusion Active Problems:   Essential hypertension   GERD   Acute respiratory failure with hypoxia (HCC)   PAF (paroxysmal atrial fibrillation) (HCC)   Clear cell carcinoma of kidney, left (HCC)   Anxiety   CAD (coronary artery disease)   Iron deficiency anemia   Elevated troponin   CKD (chronic kidney disease), stage III (HCC)   Malnutrition of moderate degree   S/P thoracotomy   Acute respiratory  failure with hypoxia secondary to recurrent pleural effusion, possibly malignant effusion  - IR guided thoracentesis 4 times in the last one month. - exudative, gram stain negative, culture negative and cytology showing reactive mesothelial cells on 7/31 but atypical cells on 8/3. -Post VATS required reintubation on 8/6 and admission to ICU.  Extubated 8/7.  Incentive spirometry.  Avoid narcotics (sensitive). - He is afebrile and has persistent leukocytosis.  - Pro calcitonin level is negative, indicative of non infective process but in view of the consolidative process and persistent leukocytosis and reactive mesothelial cell on cytology, as per discussion with ID, completed 5 days of IV cefepime for possible HCAP.  Pleural culture results from 7/27: Negative. - TCTS consulted and patient underwent right video-assisted thoracoscopy, biopsy of pleural masses and talc pleurodesis 8/6.   -Overnight 8/8, developed worsening dyspnea.  Received a couple doses of IV Lasix, 1 unit of PRBC and chest tube was removed 8/9. -Improved.  Transferring to subacute care.  Paroxysmal atrial fibrillation:  On dronedarone.  Likely not anticoagulation candidate due to hemorrhagic pleural effusion.  Resumed metoprolol 8/8.  Multifactorial mild sinus tachycardia is improved.  Now in sinus rhythm.  Iron deficiency anemia/ acute blood loss anemia:  IV iron and aranesp ordered by oncology.  Transfused total of 3 units PRBC this admission.  Hemoglobin up to 9.1 on 8/10.  Continue to trend CBCs.  Essential hypertension:  Controlled.  CAD:  no chest pain or sob. Resumed BB.   GERD  stable.   Mild Acute on Stage 3 CKD.  Creatinine which had normalized to 1.17 has increased to 1.35 on 8/10, likely related to couple doses of IV Lasix that he received day prior.  Encouraged oral  fluids.  Follow BMP in a.m.  Leukocytosis:  - suspe procalcitonin negative, urine culture showed insignificant growth, blood cultures  negative, pleural fluid culture negative.  No clinical suspicion for active infection.  Completed course of IV antibiotics.  Suspected to be reactive.  Continue to trend.   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  Dr Marin Olp on board and as per his follow-up 8/9, pleural biopsy pathology came back positive for metastatic renal cell carcinoma.  Management per oncology.  Moderate Protein Calorie malnutrition:  - nutrition consultation and input appreciated.  Continue nutritional supplements.   DVT prophylaxis: SCD'S Code Status: full code.  Family Communication: As every day, discussed in detail with patient's sister at bedside.  Updated care and answered questions. Disposition Plan: Patient was transferred to ICU post procedure on 8/6.  TCTS patient to subacute care.  Consultants:   Oncology  Tcts.   Procedures:  IR thoracentesis on 7/31,  8/6 VATS, with pleural biopsy, talc pleurodesis.  Right chest tube, removed 8/9. Foley catheter -DC'd 8/8 Arterial line DC'd 8/8. Oral ET tube-discontinued 8/7. RUE PICC line 8/7>  Antimicrobials: cefepime since 8/2 >8/6  Subjective: Better.  Dyspnea significantly improved but still breathing not at baseline.  Chest tube removed 8/9.  Coughing up some.  Appetite poor.  Generally weak.  Mild sore throat with some relief with Chloraseptic spray.  Had a BM.  Objective: Vitals:   02/05/18 0800 02/05/18 0900 02/05/18 1000 02/05/18 1111  BP: (!) 140/98 131/83 128/63   Pulse: (!) 53 (!) 107 (!) 105   Resp: (!) 31 (!) 35 (!) 36   Temp:    98.8 F (37.1 C)  TempSrc:    Oral  SpO2: 95% 95% 95%   Weight:      Height:        Intake/Output Summary (Last 24 hours) at 02/05/2018 1130 Last data filed at 02/05/2018 0800 Gross per 24 hour  Intake 855 ml  Output 1100 ml  Net -245 ml   Filed Weights   01/21/18 1107 02/04/18 0640  Weight: 92.1 kg 94.4 kg    Examination:  General exam: Pleasant  elderly male, moderately built and nourished sitting up comfortably in chair this morning.  Looks improved compared to yesterday. Respiratory system: Diminished breath sounds in the right base.  Rest of lung fields clear to auscultation.  Palpable crepitus right chest wall from subcutaneous emphysema. Cardiovascular system: S1 and S2 heard, RRR.  No JVD.  Short systolic murmur 2/6 best heard at apex..  No lower extremity edema.  Trace bilateral upper extremity edema but improving.  Telemetry personally reviewed: Sinus rhythm with BBB morphology.  Occasional PVCs. Gastrointestinal system: Abdomen is nondistended, soft and nontender.  Normal bowel sounds heard.  No organomegaly or masses appreciated.  Stable Central nervous system: Alert and oriented.  No focal neurological deficits.  Stable Extremities: Moves all extremities symmetrically.  Mild upper extremity edema but no other acute findings. Skin: No rashes, lesions or ulcers Psychiatry: Pleasant and appropriate.    Data Reviewed: I have personally reviewed following labs and imaging studies  CBC: Recent Labs  Lab 01/30/18 0203 01/31/18 0255  02/01/18 1115 02/02/18 0417 02/03/18 0400 02/04/18 0306 02/05/18 0304  WBC 16.8* 14.0*  --  25.5* 24.9* 17.0* 17.8* 18.2*  NEUTROABS 13.8* 11.7*  --   --   --   --   --   --   HGB 10.6* 10.8*   < > 9.7* 9.1*  8.0* 8.3* 9.1*  HCT 32.6* 34.0*   < > 30.7* 28.4* 24.9* 26.7* 28.2*  MCV 85.3 85.2  --  88.7 86.3 86.2 87.5 84.4  PLT 216 232  --  256 246 178 191 184   < > = values in this interval not displayed.   Basic Metabolic Panel: Recent Labs  Lab 02/01/18 0302 02/01/18 0849 02/01/18 1151 02/02/18 0417 02/03/18 0400 02/05/18 0304  NA 136 138 138 138 140 137  K 4.3 4.6 4.4 4.6 4.3 4.7  CL 102  --  108 106 110 105  CO2 23  --  21* 20* 22 25  GLUCOSE 85  --  152* 125* 91 90  BUN 20  --  21 22 24* 27*  CREATININE 1.49*  --  1.33* 1.17 1.17 1.35*  CALCIUM 8.2*  --  7.7* 7.9* 7.4* 7.9*    GFR: Estimated Creatinine Clearance: 54.6 mL/min (A) (by C-G formula based on SCr of 1.35 mg/dL (H)). Liver Function Tests: Recent Labs  Lab 01/30/18 0203 01/31/18 0255 02/01/18 0302 02/03/18 0400  AST 17 21 25 31   ALT 14 16 19 17   ALKPHOS 69 70 80 56  BILITOT 0.8 0.8 0.9 0.7  PROT 4.9* 5.0* 5.6* 4.1*  ALBUMIN 2.1* 2.1* 2.3* 1.7*   CBG: Recent Labs  Lab 02/04/18 1948 02/04/18 2333 02/05/18 0320 02/05/18 0713 02/05/18 1109  GLUCAP 92 124* 90 80 146*      Radiology Studies: Dg Chest Port 1 View  Result Date: 02/05/2018 CLINICAL DATA:  Status post thoracotomy. EXAM: PORTABLE CHEST 1 VIEW COMPARISON:  February 04, 2018 FINDINGS: Air in the right chest wall is stable. No pneumothorax. Opacity in the right base with associated effusion is stable. The left lung is clear. The cardiomediastinal silhouette is unchanged. A right PICC line is stable. The right chest tube has been removed. IMPRESSION: 1. Removal of right chest tube.  Stable right PICC line. 2. Stable air in the right chest wall. 3. Stable effusion and underlying opacity on the right. No right-sided pneumothorax noted. Electronically Signed   By: Dorise Bullion III M.D   On: 02/05/2018 07:42   Dg Chest Port 1 View  Result Date: 02/04/2018 CLINICAL DATA:  77 year old male with right video assisted thoracotomy with biopsy of pleural tumors, evacuation of hemothorax, and talc pleurodesis performed 02/01/2018. Pathology has returned positive for renal cell carcinoma metastases EXAM: PORTABLE CHEST 1 VIEW COMPARISON:  02/03/2018, CT 01/21/2018 FINDINGS: Cardiomediastinal silhouette unchanged in size and contour. Unchanged cardiac pacing device. Right upper extremity PICC is unchanged. Right thoracostomy tube unchanged, terminating at the apex. No visualized right pneumothorax. Similar appearance of patchy opacities of the mid right lung and lower right lung with blunting of the right costophrenic sulcus. Similar appearance of  extensive right subcutaneous and myofacial emphysema extending to the base of the neck. Left lung relatively well aerated. Surgical changes of the shoulders. IMPRESSION: Essentially unchanged chest x-ray, with unchanged position of right thoracostomy tube and no visualized pneumothorax. Similar appearance of right-sided airspace disease and trace right pleural fluid/tissue. Unchanged right upper extremity PICC. Electronically Signed   By: Corrie Mckusick D.O.   On: 02/04/2018 11:44        Scheduled Meds: . Chlorhexidine Gluconate Cloth  6 each Topical Daily  . dronedarone  400 mg Oral BID WC  . feeding supplement (ENSURE ENLIVE)  237 mL Oral TID BM  . ferrous sulfate  325 mg Oral Q breakfast  . fluticasone  2 spray Each  Nare QHS  . folic acid  2 mg Oral Daily  . furosemide  40 mg Intravenous BID  . lipase/protease/amylase  72,000 Units Oral BID AC  . mouth rinse  15 mL Mouth Rinse BID  . megestrol  400 mg Oral BID  . metoprolol tartrate  12.5 mg Oral BID  . pantoprazole  40 mg Oral Daily  . potassium chloride  20 mEq Oral BID  . senna-docusate  1 tablet Oral QHS  . senna-docusate  2 tablet Oral BID  . sodium chloride flush  10-40 mL Intracatheter Q12H  . tamsulosin  0.4 mg Oral QPC supper  . vitamin B-6  25 mg Oral BID   Continuous Infusions: . magnesium sulfate 1 - 4 g bolus IVPB    . potassium chloride       LOS: 14 days    Time spent: 25 minutes.    Vernell Leep, MD, FACP, [facility]. [facility] Pager [phone]  If 7PM-7AM, please contact night-coverage [email or web address] Password [email or web address] 02/05/2018, 11:30 AM

## 2018-02-05 NOTE — Progress Notes (Signed)
4 Days Post-Op Procedure(s) (LRB): VIDEO ASSISTED THORACOSCOPY (Right) PLEURAL BIOPSY (Right) DRAINAGE OF PLEURAL EFFUSION (Right) TALC PLEURADESIS (Right) Subjective: R malig pleural effusion Weak but off O2 Needs PT, tx to 2 C subacute CXR with mild R effusion  Objective: Vital signs in last 24 hours: Temp:  [97.6 F (36.4 C)-99.1 F (37.3 C)] 98.9 F (37.2 C) (08/10 0717) Pulse Rate:  [47-107] 105 (08/10 1000) Cardiac Rhythm: Bundle branch block;Normal sinus rhythm (08/10 0800) Resp:  [17-43] 36 (08/10 1000) BP: (102-172)/(55-136) 128/63 (08/10 1000) SpO2:  [93 %-98 %] 95 % (08/10 1000)  Hemodynamic parameters for last 24 hours:    Intake/Output from previous day: 08/09 0701 - 08/10 0700 In: 705 [P.O.:390; Blood:315] Out: 1025 [Urine:1025] Intake/Output this shift: Total I/O In: 150 [P.O.:150] Out: 175 [Urine:175]       Exam    General- alert and comfortable    Neck- no JVD, no cervical adenopathy palpable, no carotid bruit   Lungs- clear without rales, wheezes   Cor- regular rate and rhythm, no murmur , gallop   Abdomen- soft, non-tender   Extremities - warm, non-tender, minimal edema   Neuro- oriented, appropriate, no focal weakness   Lab Results: Recent Labs    02/04/18 0306 02/05/18 0304  WBC 17.8* 18.2*  HGB 8.3* 9.1*  HCT 26.7* 28.2*  PLT 191 184   BMET:  Recent Labs    02/03/18 0400 02/05/18 0304  NA 140 137  K 4.3 4.7  CL 110 105  CO2 22 25  GLUCOSE 91 90  BUN 24* 27*  CREATININE 1.17 1.35*  CALCIUM 7.4* 7.9*    PT/INR: No results for input(s): LABPROT, INR in the last 72 hours. ABG    Component Value Date/Time   PHART 7.400 02/02/2018 0959   HCO3 18.8 (L) 02/02/2018 0959   TCO2 20 (L) 02/02/2018 0959   ACIDBASEDEF 5.0 (H) 02/02/2018 0959   O2SAT 99.0 02/02/2018 0959   CBG (last 3)  Recent Labs    02/04/18 2333 02/05/18 0320 02/05/18 0713  GLUCAP 124* 90 80    Assessment/Plan: S/P Procedure(s) (LRB): VIDEO ASSISTED  THORACOSCOPY (Right) PLEURAL BIOPSY (Right) DRAINAGE OF PLEURAL EFFUSION (Right) TALC PLEURADESIS (Right)  Transfer, diuresis  LOS: 14 days    Tharon Aquas Trigt III 02/05/2018

## 2018-02-06 LAB — CBC
HCT: 29.1 % — ABNORMAL LOW (ref 39.0–52.0)
Hemoglobin: 9.2 g/dL — ABNORMAL LOW (ref 13.0–17.0)
MCH: 27 pg (ref 26.0–34.0)
MCHC: 31.6 g/dL (ref 30.0–36.0)
MCV: 85.3 fL (ref 78.0–100.0)
Platelets: 219 10*3/uL (ref 150–400)
RBC: 3.41 MIL/uL — ABNORMAL LOW (ref 4.22–5.81)
RDW: 18.7 % — ABNORMAL HIGH (ref 11.5–15.5)
WBC: 16.7 10*3/uL — ABNORMAL HIGH (ref 4.0–10.5)

## 2018-02-06 LAB — BASIC METABOLIC PANEL
Anion gap: 9 (ref 5–15)
BUN: 28 mg/dL — ABNORMAL HIGH (ref 8–23)
CO2: 26 mmol/L (ref 22–32)
Calcium: 8.2 mg/dL — ABNORMAL LOW (ref 8.9–10.3)
Chloride: 102 mmol/L (ref 98–111)
Creatinine, Ser: 1.43 mg/dL — ABNORMAL HIGH (ref 0.61–1.24)
GFR calc Af Amer: 53 mL/min — ABNORMAL LOW (ref >60)
GFR calc non Af Amer: 46 mL/min — ABNORMAL LOW (ref >60)
Glucose, Bld: 87 mg/dL (ref 70–99)
Potassium: 4.5 mmol/L (ref 3.5–5.1)
Sodium: 137 mmol/L (ref 135–145)

## 2018-02-06 LAB — MAGNESIUM: Magnesium: 2.3 mg/dL (ref 1.7–2.4)

## 2018-02-06 MED ORDER — POLYETHYLENE GLYCOL 3350 17 G PO PACK
17.0000 g | PACK | Freq: Two times a day (BID) | ORAL | Status: DC
  Administered 2018-02-06 – 2018-02-07 (×3): 17 g via ORAL
  Filled 2018-02-06 (×3): qty 1

## 2018-02-06 MED ORDER — LACTULOSE 10 GM/15ML PO SOLN
20.0000 g | Freq: Once | ORAL | Status: AC
  Administered 2018-02-06: 20 g via ORAL
  Filled 2018-02-06: qty 30

## 2018-02-06 MED ORDER — GERHARDT'S BUTT CREAM
TOPICAL_CREAM | Freq: Three times a day (TID) | CUTANEOUS | Status: DC | PRN
  Filled 2018-02-06: qty 1

## 2018-02-06 NOTE — Evaluation (Signed)
Occupational Therapy Evaluation Patient Details Name: Barry Curry MRN: [id number] DOB: 1941-02-28 Today's Date: 02/06/2018    History of Present Illness 77 y.o. male with admitted with right pleural effusion with new lymphadenopathy s/p VATS 8/6 with reintubation post procedure, extubated 8/7. PMhx: 7/27 thoracentesis, HTN, HLD, GERD, depression, anxiety, Afib, CAD, PVC, AS, RBBB, renal CA, melenoma   Clinical Impression   PTA, pt was living with his daughter and was performing BADLs with increased effort and assistance for bathing. Pt currently requiring Min A for UB ADLs, Mod A for LB ADLs, and Min A +2 for functional mobility. Pt presenting with decreased activity tolerance with HR elevating to 140s and SpO2 dropping to 86% on RA with activity. Pt would benefit from further acute OT to facilitate safe dc. Recommend dc to SNF for further OT to optimize safety, independence with ADLs, and return to PLOF.   BP 115/61 at EOB, 96/71 after mobility, and 112/74 with legs elevated in recliner     Follow Up Recommendations  SNF;Supervision/Assistance - 24 hour    Equipment Recommendations  Other (comment)(Defer to next venue)    Recommendations for Other Services PT consult     Precautions / Restrictions Precautions Precautions: Fall Precaution Comments: chair follow, watch sats Restrictions Weight Bearing Restrictions: No      Mobility Bed Mobility Overal bed mobility: Needs Assistance Bed Mobility: Supine to Sit     Supine to sit: Min guard     General bed mobility comments: sitting EOB with nursing at entry  Transfers Overall transfer level: Needs assistance Equipment used: Rolling walker (2 wheeled) Transfers: Sit to/from Stand Sit to Stand: Min assist;Min guard         General transfer comment: min A for safety, pt progressing more independence with standing this visit.     Balance Overall balance assessment: Needs assistance Sitting-balance support: Feet  unsupported;No upper extremity supported Sitting balance-Leahy Scale: Fair     Standing balance support: Bilateral upper extremity supported Standing balance-Leahy Scale: Poor Standing balance comment: heavy UE support for balance                           ADL either performed or assessed with clinical judgement   ADL Overall ADL's : Needs assistance/impaired Eating/Feeding: Set up;Sitting(supported sitting)   Grooming: Wash/dry face;Min guard;Sitting Grooming Details (indicate cue type and reason): Sitting at EOB with Min Guard A due to posterior lean and fatigue Upper Body Bathing: Minimal assistance;Sitting   Lower Body Bathing: Moderate assistance;Sit to/from stand   Upper Body Dressing : Minimal assistance;Sitting   Lower Body Dressing: Moderate assistance;Sit to/from stand Lower Body Dressing Details (indicate cue type and reason): Pt able to bend forward to adjust socks. Mod A for standing balance during dynamic tasks Toilet Transfer: Minimal assistance;Ambulation;RW(Simulated to recliner) Armed forces technical officer Details (indicate cue type and reason): Min A for safety and balance         Functional mobility during ADLs: Minimal assistance;Rolling walker General ADL Comments: Pt presenting with poor activity tolerance with HR elevating to 140s and SpO2 dropping to 86% on RA.      Vision         Perception     Praxis      Pertinent Vitals/Pain Pain Assessment: Faces Pain Score: 2  Faces Pain Scale: Hurts a little bit Pain Location: right chest Pain Descriptors / Indicators: Aching Pain Intervention(s): Monitored during session;Limited activity within patient's tolerance;Repositioned  Hand Dominance Right   Extremity/Trunk Assessment Upper Extremity Assessment Upper Extremity Assessment: Generalized weakness   Lower Extremity Assessment Lower Extremity Assessment: Generalized weakness   Cervical / Trunk Assessment Cervical / Trunk Assessment:  Kyphotic;Other exceptions(decreased neck ROM )   Communication Communication Communication: No difficulties   Cognition Arousal/Alertness: Awake/alert Behavior During Therapy: WFL for tasks assessed/performed Overall Cognitive Status: Within Functional Limits for tasks assessed                                     General Comments  Daughter present throughout session    Exercises     Shoulder Instructions      Home Living Family/patient expects to be discharged to:: Private residence Living Arrangements: Children;Other relatives Available Help at Discharge: Family;Available PRN/intermittently Type of Home: House Home Access: Stairs to enter [employer] of Steps: 1 flight Entrance Stairs-Rails: Left Home Layout: Two level;Bed/bath upstairs Alternate Level Stairs-Number of Steps: 14   Bathroom Shower/Tub: Walk-in Hydrologist: Handicapped height Bathroom Accessibility: Yes   Home Equipment: Environmental consultant - 2 wheels;Cane - single point          Prior Functioning/Environment Level of Independence: Needs assistance  Gait / Transfers Assistance Needed: needs assist with steps, ambulates household distance  ADL's / Homemaking Assistance Needed: requires assist for bathing, able to dress with increased effort            OT Problem List: Decreased strength;Decreased range of motion;Decreased activity tolerance;Impaired balance (sitting and/or standing);Decreased safety awareness;Decreased knowledge of use of DME or AE;Decreased knowledge of precautions;Pain;Cardiopulmonary status limiting activity      OT Treatment/Interventions: Self-care/ADL training;Therapeutic exercise;Energy conservation;DME and/or AE instruction;Therapeutic activities;Patient/family education    OT Goals(Current goals can be found in the care plan section) Acute Rehab OT Goals Patient Stated Goal: go home to [facility] OT Goal Formulation: With patient Time  For Goal Achievement: 02/20/18 Potential to Achieve Goals: Good  OT Frequency: Min 2X/week   Barriers to D/C:            Co-evaluation PT/OT/SLP Co-Evaluation/Treatment: Yes Reason for Co-Treatment: For patient/therapist safety   OT goals addressed during session: ADL's and self-care      AM-PAC PT "6 Clicks" Daily Activity     Outcome Measure Help from another person eating meals?: A Little Help from another person taking care of personal grooming?: A Little Help from another person toileting, which includes using toliet, bedpan, or urinal?: A Lot Help from another person bathing (including washing, rinsing, drying)?: A Lot Help from another person to put on and taking off regular upper body clothing?: A Little Help from another person to put on and taking off regular lower body clothing?: A Lot 6 Click Score: 15   End of Session Equipment Utilized During Treatment: Gait belt;Rolling walker Nurse Communication: Mobility status  Activity Tolerance: Patient limited by fatigue Patient left: in chair;with call bell/phone within reach;with chair alarm set;with family/visitor present  OT Visit Diagnosis: Unsteadiness on feet (R26.81);Other abnormalities of gait and mobility (R26.89);Muscle weakness (generalized) (M62.81);Pain Pain - Right/Left: Right Pain - part of body: (Chest)                Time: 1610-9604 OT Time Calculation (min): 28 min Charges:  OT General Charges $OT Visit: 1 Visit OT Evaluation $OT Eval Moderate Complexity: [place], OTR/L Acute Rehab Pager: [phone] Office: [place]  02/06/2018, 12:58 PM

## 2018-02-06 NOTE — Progress Notes (Addendum)
      [address]       [place] [address]             [phone]       5 Days Post-Op Procedure(s) (LRB): VIDEO ASSISTED THORACOSCOPY (Right) PLEURAL BIOPSY (Right) DRAINAGE OF PLEURAL EFFUSION (Right) TALC PLEURADESIS (Right)  Subjective: Patient asking for laxative to help move bowels  Objective: Vital signs in last 24 hours: Temp:  [98.2 F (36.8 C)-98.8 F (37.1 C)] 98.3 F (36.8 C) (08/11 0813) Pulse Rate:  [86-107] 95 (08/11 0813) Cardiac Rhythm: Normal sinus rhythm (08/11 0802) Resp:  [24-36] 35 (08/11 0813) BP: (111-174)/(58-83) 115/61 (08/11 0813) SpO2:  [94 %-97 %] 94 % (08/11 0813) Weight:  [91.9 kg] 91.9 kg (08/11 0425)      Intake/Output from previous day: 08/10 0701 - 08/11 0700 In: 670 [P.O.:620; IV Piggyback:50] Out: 1875 [Urine:1875]   Physical Exam:  Cardiovascular: RRR Pulmonary: Slightly diminished at right base and left lung is mostly clear Abdomen: Soft, non tender, bowel sounds present. Extremities: Trace lower extremity edema. Wounds: Clean and dry.  No erythema or signs of infection.   Lab Results: CBC: Recent Labs    02/05/18 0304 02/06/18 0537  WBC 18.2* 16.7*  HGB 9.1* 9.2*  HCT 28.2* 29.1*  PLT 184 219   BMET:  Recent Labs    02/05/18 0304 02/06/18 0537  NA 137 137  K 4.7 4.5  CL 105 102  CO2 25 26  GLUCOSE 90 87  BUN 27* 28*  CREATININE 1.35* 1.43*  CALCIUM 7.9* 8.2*    PT/INR: No results for input(s): LABPROT, INR in the last 72 hours. ABG:  INR: Will add last result for INR, ABG once components are confirmed Will add last 4 CBG results once components are confirmed  Assessment/Plan:  1. CV - First degree heart block. On Multaq 400 mg bi and Lopressor 12.5 mg bid. 2.  Pulmonary - On room air. Check CXR in am. Encourage incentive spirometer. 3. ABL anemia-H and H stable at 9.2 and 29.1. Continue Ferrous sulfate. 4. On Lasix 40 mg IV bid 5. Deconditioning-continue PT/OT. Will need  SNF when ready for discharge.   Barry M ZimmermanPA-C 02/06/2018,8:15 AM [phone]  Patient ok for DC to [facility] tomorrow Will need appt with his Oncologist Dr Marin Olp for metastatic renal cell Leave chest tube sutures in place for later removal in TCTS Office  patient examined and medical record reviewed,agree with above note. Barry Curry 02/06/2018

## 2018-02-06 NOTE — Progress Notes (Signed)
PROGRESS NOTE    Barry Curry  BJS:[id number] DOB: 04-26-1941 DOA: 01/21/2018 PCP: [name], PA-C    Brief Narrative:  Barry Curry a 77 y.o.malewith medical history significant ofhypertension, hyperlipidemia, GERD, depression, anxiety, atrial fibrillation not on anticoagulant, CAD, PVC, aortic stenosis, RBBB, iron deficiency anemia, CKD, clear renal cell cancer (left nephrectomy), melanoma, OSA who presented with shortness of breath. 3 weeks prior patient had a right thoracentesis at [facility] with 670 cc of bloody fluid removed. During this admission on 01/22/2018 patient had a ultrasound-guided right thoracentesis with 2 L of bloody fluid removed. Pleural fluid sent for Gram stain, culture, cytology. Pulmonology consulted to assess for recurrence of pleural effusion. Exudative pleural fluid noted. He underwent repeat thoracentesis on 7/31 for recurrent pleural effusion, with cytology showing reactive mesothelial cells.  TCTS consulted and patient underwent right video-assisted thoracoscopy, biopsy of pleural masses and talc pleurodesis 8/6.  He was extubated postprocedure but then became very anxious and tachypneic for which she was put on low-dose Precedex for pain control but became obtunded and hypercapnic for which she was reintubated and admitted to ICU.  Extubated 8/7.  Pleural fluid 8/3 shows atypical cells.  Pleural mass biopsy consistent with metastatic renal cell carcinoma.   Assessment & Plan:   Principal Problem:   Recurrent right pleural effusion Active Problems:   Essential hypertension   GERD   Acute respiratory failure with hypoxia (HCC)   PAF (paroxysmal atrial fibrillation) (HCC)   Clear cell carcinoma of kidney, left (HCC)   Anxiety   CAD (coronary artery disease)   Iron deficiency anemia   Elevated troponin   CKD (chronic kidney disease), stage III (HCC)   Malnutrition of moderate degree   S/P thoracotomy   Acute respiratory  failure with hypoxia secondary to recurrent malignant pleural effusion  - IR guided thoracentesis 4 times in the last one month. - exudative, gram stain negative, culture negative and cytology showing reactive mesothelial cells on 7/31 but atypical cells on 8/3. - Post VATS required reintubation on 8/6 and admission to ICU.  Extubated 8/7.  Incentive spirometry.  Avoid narcotics (sensitive). - Pro calcitonin level was negative, indicative of non infective process but in view of the consolidative process and persistent leukocytosis and reactive mesothelial cell on cytology, as per discussion with ID, completed 5 days of IV cefepime for possible HCAP.  Pleural culture results from 7/27: Negative. - TCTS consulted and patient underwent right video-assisted thoracoscopy, biopsy of pleural masses and talc pleurodesis 8/6.   -Overnight 8/8, developed worsening dyspnea.  Received a couple doses of IV Lasix, 1 unit of PRBC and chest tube was removed 8/9. -Respiratory status improved.  Hypoxia has resolved.  Remains on IV Lasix 40 mg twice daily as per TCTS, volume status improved, creatinine starting to creep up > I have DC'ed IV Lasix 8/11.  Paroxysmal atrial fibrillation:  On dronedarone.  Likely not anticoagulation candidate due to hemorrhagic pleural effusion.  Resumed metoprolol 8/8.  Multifactorial mild sinus tachycardia is improved.  Now in sinus rhythm. table.  Iron deficiency anemia/ acute blood loss anemia:  IV iron and aranesp ordered by oncology.  Transfused total of 3 units PRBC this admission.  Hemoglobin stable in the low 9 g range for the last 2 days.  Periodically follow CBCs.  Essential hypertension:  Controlled.  Continue metoprolol.  CAD:  No chest pain or sob. Resumed BB.   GERD  stable.   Acute on Stage 3 CKD.  Creatinine  which had normalized to 1.17 has increased to 1.35 on 8/10 and 1.43 on 8/11, likely related to couple doses of IV Lasix that he has been receiving.  No  significant volume overload.  Discontinued IV Lasix on 8/11.  Follow BMP in a.m.  Leukocytosis:  - procalcitonin negative, urine culture showed insignificant growth, blood cultures negative, pleural fluid culture negative.  No clinical suspicion for active infection.  Completed course of IV antibiotics.  Suspected to be reactive.  Continue to trend.   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  Dr Marin Olp on board and as per his follow-up 8/9, pleural biopsy pathology came back positive for metastatic renal cell carcinoma.  Management per oncology.  Moderate Protein Calorie malnutrition:  - nutrition consultation and input appreciated.  Continue nutritional supplements.   DVT prophylaxis: SCD'S Code Status: full code.  Family Communication: Discussed in detail with patient's daughter at bedside.  Updated care and answered questions. Disposition Plan: Patient was transferred to ICU post procedure on 8/6.  Patient transferred to subacute care on 8/10.  DC to SNF pending medical improvement, possibly in the next 1 to 2 days..  Consultants:   Oncology  TCTS  Procedures:  IR thoracentesis on 7/31,  8/6 VATS, with pleural biopsy, talc pleurodesis.  Right chest tube, removed 8/9. Foley catheter -DC'd 8/8 Arterial line DC'd 8/8. Oral ET tube-discontinued 8/7. RUE PICC line 8/7>  Antimicrobials: cefepime since 8/2 >8/6  Subjective: Slept better last night.  Denies dyspnea.  No BM for several days.  Sore throat better.  Appetite still low.  Feels generally weak but worked better with PT as per daughter.   Objective: Vitals:   02/06/18 0012 02/06/18 0425 02/06/18 0813 02/06/18 1120  BP: (!) 155/69 (!) 174/64 115/61 102/61  Pulse: 86 91 95 92  Resp: (!) 33 (!) 28 (!) 35 (!) 30  Temp: 98.3 F (36.8 C) 98.3 F (36.8 C) 98.3 F (36.8 C) 98 F (36.7 C)  TempSrc: Oral Oral Oral Oral  SpO2: 95% 95% 94% 95%  Weight:  91.9 kg    Height:         Intake/Output Summary (Last 24 hours) at 02/06/2018 1137 Last data filed at 02/06/2018 1100 Gross per 24 hour  Intake 640 ml  Output 2180 ml  Net -1540 ml   Filed Weights   01/21/18 1107 02/04/18 0640 02/06/18 0425  Weight: 92.1 kg 94.4 kg 91.9 kg    Examination:  General exam: Pleasant elderly male, moderately built and nourished sitting up comfortably in chair this morning.  Stable. Respiratory system: Diminished breath sounds in the right base.  Rest of lung fields clear to auscultation.  Palpable crepitus right chest wall from subcutaneous emphysema, less today.  Stable. Cardiovascular system: S1 and S2 heard, RRR.  No JVD.  Short systolic murmur 2/6 best heard at apex..  No lower extremity edema.  Trace bilateral upper extremity edema but improving.  Telemetry personally reviewed: Sinus rhythm, occasional nonsustained SVT, occasional bigeminy and PVCs. Gastrointestinal system: Abdomen is nondistended, soft and nontender.  Normal bowel sounds heard.  No organomegaly or masses appreciated.  Stable. Central nervous system: Alert and oriented.  No focal neurological deficits.  Stable. Extremities: Moves all extremities symmetrically.  Mild upper extremity edema but no other acute findings. Skin: No rashes, lesions or ulcers Psychiatry: Flat affect..    Data Reviewed: I have personally reviewed following labs and imaging studies  CBC: Recent Labs  Lab 01/31/18 0255  02/02/18 0417 02/03/18 0400 02/04/18 0306 02/05/18 0304 02/06/18 0537  WBC 14.0*   < > 24.9* 17.0* 17.8* 18.2* 16.7*  NEUTROABS 11.7*  --   --   --   --   --   --   HGB 10.8*   < > 9.1* 8.0* 8.3* 9.1* 9.2*  HCT 34.0*   < > 28.4* 24.9* 26.7* 28.2* 29.1*  MCV 85.2   < > 86.3 86.2 87.5 84.4 85.3  PLT 232   < > 246 178 191 184 219   < > = values in this interval not displayed.   Basic Metabolic Panel: Recent Labs  Lab 02/01/18 1151 02/02/18 0417 02/03/18 0400 02/05/18 0304 02/06/18 0537  NA 138 138 140  137 137  K 4.4 4.6 4.3 4.7 4.5  CL 108 106 110 105 102  CO2 21* 20* 22 25 26   GLUCOSE 152* 125* 91 90 87  BUN 21 22 24* 27* 28*  CREATININE 1.33* 1.17 1.17 1.35* 1.43*  CALCIUM 7.7* 7.9* 7.4* 7.9* 8.2*  MG  --   --   --   --  2.3   GFR: Estimated Creatinine Clearance: 47.5 mL/min (A) (by C-G formula based on SCr of 1.43 mg/dL (H)). Liver Function Tests: Recent Labs  Lab 01/31/18 0255 02/01/18 0302 02/03/18 0400  AST 21 25 31   ALT 16 19 17   ALKPHOS 70 80 56  BILITOT 0.8 0.9 0.7  PROT 5.0* 5.6* 4.1*  ALBUMIN 2.1* 2.3* 1.7*   CBG: Recent Labs  Lab 02/04/18 1948 02/04/18 2333 02/05/18 0320 02/05/18 0713 02/05/18 1109  GLUCAP 92 124* 90 80 146*      Radiology Studies: Dg Chest Port 1 View  Result Date: 02/05/2018 CLINICAL DATA:  Encounter for SOB EXAM: PORTABLE CHEST - 1 VIEW COMPARISON:  Earlier film the same day FINDINGS: Extensive right lateral and neck subcutaneous emphysema as before. No pneumothorax evident. Right lateral pleural thickening or loculated effusion. Adjacent consolidation/atelectasis in the mid and lower right lung stable. Left lung clear. Heart size normal. Aortic Atherosclerosis (ICD10-170.0). Right arm PICC line to the cavoatrial junction. Left subclavian transvenous pacemaker stable. Bilateral shoulder arthroplasty hardware partially seen. IMPRESSION: Stable appearance since earlier film of the same day. Electronically Signed   By: Lucrezia Europe M.D.   On: 02/05/2018 12:48   Dg Chest Port 1 View  Result Date: 02/05/2018 CLINICAL DATA:  Status post thoracotomy. EXAM: PORTABLE CHEST 1 VIEW COMPARISON:  February 04, 2018 FINDINGS: Air in the right chest wall is stable. No pneumothorax. Opacity in the right base with associated effusion is stable. The left lung is clear. The cardiomediastinal silhouette is unchanged. A right PICC line is stable. The right chest tube has been removed. IMPRESSION: 1. Removal of right chest tube.  Stable right PICC line. 2. Stable  air in the right chest wall. 3. Stable effusion and underlying opacity on the right. No right-sided pneumothorax noted. Electronically Signed   By: Dorise Bullion III M.D   On: 02/05/2018 07:42        Scheduled Meds: . Chlorhexidine Gluconate Cloth  6 each Topical Daily  . dronedarone  400 mg Oral BID WC  . feeding supplement (ENSURE ENLIVE)  237 mL Oral TID BM  . ferrous sulfate  325 mg Oral Q breakfast  . fluticasone  2 spray Each Nare QHS  . folic acid  2 mg Oral Daily  . furosemide  40 mg Intravenous BID  . lactulose  20 g Oral Once  .  lipase/protease/amylase  72,000 Units Oral BID AC  . mouth rinse  15 mL Mouth Rinse BID  . megestrol  400 mg Oral BID  . metoprolol tartrate  12.5 mg Oral BID  . pantoprazole  40 mg Oral Daily  . potassium chloride  20 mEq Oral BID  . senna-docusate  1 tablet Oral QHS  . senna-docusate  2 tablet Oral BID  . sodium chloride flush  10-40 mL Intracatheter Q12H  . tamsulosin  0.4 mg Oral QPC supper  . vitamin B-6  25 mg Oral BID   Continuous Infusions: . potassium chloride       LOS: 15 days    Time spent: 35 minutes.    Vernell Leep, MD, FACP, [facility]. [facility] Pager [phone]  If 7PM-7AM, please contact night-coverage [email or web address] Password [facility] 02/06/2018, 11:37 AM

## 2018-02-06 NOTE — Progress Notes (Signed)
Physical Therapy Treatment Patient Details Name: Barry Curry MRN: [id number] DOB: Sep 26, 1940 Today's Date: 02/06/2018    History of Present Illness 77 y.o. male with admitted with right pleural effusion with new lymphadenopathy s/p VATS 8/6 with reintubation post procedure, extubated 8/7. PMhx: 7/27 thoracentesis, HTN, HLD, GERD, depression, anxiety, Afib, CAD, PVC, AS, RBBB, renal CA, melenoma    PT Comments    Pt demonstrating slight increase in tolerance today improving ambulation distance from 5' to 15' this session. HR quickly jumps to 140 with exertion, and with 10' of walking desats on RA from 96 to 80% with dizziness reported. Close chair follow for safety for any OOB mobility required. Pt eager to exercise with therapy.  BP also dropped by end of session to 96/71, brought legs up on chair and rested for 2 minutes re checked to be at 112/74.  SNF recs still appropriate.     Follow Up Recommendations  SNF     Equipment Recommendations  Other (comment)(TBD)    Recommendations for Other Services       Precautions / Restrictions Precautions Precautions: Fall Precaution Comments: chair follow, watch sats Restrictions Weight Bearing Restrictions: No    Mobility  Bed Mobility Overal bed mobility: Needs Assistance Bed Mobility: Supine to Sit     Supine to sit: Min guard     General bed mobility comments: sitting EOB with nursing at entry  Transfers Overall transfer level: Needs assistance Equipment used: Rolling walker (2 wheeled) Transfers: Sit to/from Stand Sit to Stand: Min assist;Min guard         General transfer comment: min guard for safety, pt progressing more independence with standing this visit.   Ambulation/Gait Ambulation/Gait assistance: Min guard;+2 physical assistance Gait Distance (Feet): 15 Feet Assistive device: Rolling walker (2 wheeled) Gait Pattern/deviations: Step-through pattern;Decreased stride length;Shuffle;Trunk flexed Gait  velocity: decreased    General Gait Details: pt with close chair follow as quickly de sats and fatigues. very limited tolerence to activity at this time.    Stairs             Wheelchair Mobility    Modified Rankin (Stroke Patients Only)       Balance Overall balance assessment: Needs assistance Sitting-balance support: Feet unsupported;No upper extremity supported Sitting balance-Leahy Scale: Fair     Standing balance support: Bilateral upper extremity supported Standing balance-Leahy Scale: Poor Standing balance comment: heavy UE support for balance                            Cognition Arousal/Alertness: Awake/alert Behavior During Therapy: WFL for tasks assessed/performed Overall Cognitive Status: Within Functional Limits for tasks assessed                                        Exercises      General Comments        Pertinent Vitals/Pain Pain Assessment: Faces Pain Score: 2  Faces Pain Scale: Hurts a little bit Pain Location: right chest Pain Descriptors / Indicators: Aching Pain Intervention(s): Limited activity within patient's tolerance;Monitored during session    Home Living Family/patient expects to be discharged to:: Private residence Living Arrangements: Children;Other relatives Available Help at Discharge: Family;Available PRN/intermittently Type of Home: House Home Access: Stairs to enter     Home Equipment: [place] - 2 wheels;Cane - single point      Prior  Function Level of Independence: Needs assistance  Gait / Transfers Assistance Needed: needs assist with steps, ambulates household distance  ADL's / Homemaking Assistance Needed: requires assist for bathing, able to dress with increased effort     PT Goals (current goals can now be found in the care plan section) Acute Rehab PT Goals Patient Stated Goal: go home to [facility] PT Goal Formulation: With patient Time For Goal Achievement:  02/17/18 Potential to Achieve Goals: Fair Progress towards PT goals: Progressing toward goals    Frequency    Min 2X/week      PT Plan Current plan remains appropriate    Co-evaluation PT/OT/SLP Co-Evaluation/Treatment: Yes Reason for Co-Treatment: For patient/therapist safety;To address functional/ADL transfers;Complexity of the patient's impairments (multi-system involvement)          AM-PAC PT "6 Clicks" Daily Activity  Outcome Measure  Difficulty turning over in bed (including adjusting bedclothes, sheets and blankets)?: A Little Difficulty moving from lying on back to sitting on the side of the bed? : Unable Difficulty sitting down on and standing up from a chair with arms (e.g., wheelchair, bedside commode, etc,.)?: Unable Help needed moving to and from a bed to chair (including a wheelchair)?: A Little Help needed walking in hospital room?: A Lot Help needed climbing 3-5 steps with a railing? : Total 6 Click Score: 11    End of Session Equipment Utilized During Treatment: Gait belt Activity Tolerance: Patient limited by fatigue;Treatment limited secondary to medical complications (Comment) Patient left: in chair;with call bell/phone within reach;with family/visitor present Nurse Communication: Other (comment) PT Visit Diagnosis: Unsteadiness on feet (R26.81);Other abnormalities of gait and mobility (R26.89);Muscle weakness (generalized) (M62.81);Difficulty in walking, not elsewhere classified (R26.2)     Time: 0071-2197 PT Time Calculation (min) (ACUTE ONLY): 30 min  Charges:  $Gait Training: 8-22 mins                     Reinaldo Berber, PT, DPT Acute Rehab Services Pager: [phone]     Reinaldo Berber 02/06/2018, 10:22 AM

## 2018-02-07 ENCOUNTER — Inpatient Hospital Stay (HOSPITAL_COMMUNITY): Payer: Medicare Other

## 2018-02-07 LAB — CBC
HCT: 29.4 % — ABNORMAL LOW (ref 39.0–52.0)
Hemoglobin: 9.3 g/dL — ABNORMAL LOW (ref 13.0–17.0)
MCH: 26.9 pg (ref 26.0–34.0)
MCHC: 31.6 g/dL (ref 30.0–36.0)
MCV: 85 fL (ref 78.0–100.0)
PLATELETS: 224 10*3/uL (ref 150–400)
RBC: 3.46 MIL/uL — ABNORMAL LOW (ref 4.22–5.81)
RDW: 18.7 % — AB (ref 11.5–15.5)
WBC: 15.7 10*3/uL — AB (ref 4.0–10.5)

## 2018-02-07 LAB — BASIC METABOLIC PANEL
ANION GAP: 7 (ref 5–15)
BUN: 29 mg/dL — ABNORMAL HIGH (ref 8–23)
CALCIUM: 8 mg/dL — AB (ref 8.9–10.3)
CO2: 27 mmol/L (ref 22–32)
CREATININE: 1.37 mg/dL — AB (ref 0.61–1.24)
Chloride: 103 mmol/L (ref 98–111)
GFR, EST AFRICAN AMERICAN: 56 mL/min — AB (ref >60)
GFR, EST NON AFRICAN AMERICAN: 48 mL/min — AB (ref >60)
Glucose, Bld: 86 mg/dL (ref 70–99)
Potassium: 5 mmol/L (ref 3.5–5.1)
SODIUM: 137 mmol/L (ref 135–145)

## 2018-02-07 MED ORDER — METOPROLOL TARTRATE 25 MG PO TABS: 12.5000 mg | ORAL_TABLET | Freq: Two times a day (BID) | ORAL | 0 refills | Status: DC

## 2018-02-07 MED ORDER — TRAMADOL HCL 50 MG PO TABS: 50.0000 mg | ORAL_TABLET | Freq: Four times a day (QID) | ORAL | 0 refills | Status: DC | PRN

## 2018-02-07 MED ORDER — MEGESTROL ACETATE 400 MG/10ML PO SUSP: 400.0000 mg | Freq: Two times a day (BID) | ORAL | 0 refills | Status: DC

## 2018-02-07 MED ORDER — DRONEDARONE HCL 400 MG PO TABS: 400.0000 mg | ORAL_TABLET | Freq: Two times a day (BID) | ORAL | 0 refills | Status: DC

## 2018-02-07 MED ORDER — DRONEDARONE HCL 400 MG PO TABS: 200.0000 mg | ORAL_TABLET | Freq: Two times a day (BID) | ORAL | 0 refills | Status: DC

## 2018-02-07 NOTE — Progress Notes (Addendum)
      [address]       [place] [address]             [phone]      6 Days Post-Op Procedure(s) (LRB): VIDEO ASSISTED THORACOSCOPY (Right) PLEURAL BIOPSY (Right) DRAINAGE OF PLEURAL EFFUSION (Right) TALC PLEURADESIS (Right) Subjective: No issues overnight. Patient is pain free.   Objective: Vital signs in last 24 hours: Temp:  [97.6 F (36.4 C)-98.7 F (37.1 C)] 97.6 F (36.4 C) (08/12 0345) Pulse Rate:  [87-99] 95 (08/12 0345) Cardiac Rhythm: Heart block (08/12 0700) Resp:  [25-36] 25 (08/12 0345) BP: (99-117)/(61-71) 117/70 (08/12 0345) SpO2:  [94 %-96 %] 95 % (08/12 0345)     Intake/Output from previous day: 08/11 0701 - 08/12 0700 In: 300 [P.O.:300] Out: 1601 [Urine:1600; Stool:1] Intake/Output this shift: No intake/output data recorded.  General appearance: alert, cooperative and no distress Heart: irregularly irregular rhythm Lungs: clear to auscultation bilaterally Abdomen: soft, non-tender; bowel sounds normal; no masses,  no organomegaly Extremities: extremities normal, atraumatic, no cyanosis or edema Wound: clean and dry  Lab Results: Recent Labs    02/06/18 0537 02/07/18 0446  WBC 16.7* 15.7*  HGB 9.2* 9.3*  HCT 29.1* 29.4*  PLT 219 224   BMET:  Recent Labs    02/06/18 0537 02/07/18 0446  NA 137 137  K 4.5 5.0  CL 102 103  CO2 26 27  GLUCOSE 87 86  BUN 28* 29*  CREATININE 1.43* 1.37*  CALCIUM 8.2* 8.0*    PT/INR: No results for input(s): LABPROT, INR in the last 72 hours. ABG    Component Value Date/Time   PHART 7.400 02/02/2018 0959   HCO3 18.8 (L) 02/02/2018 0959   TCO2 20 (L) 02/02/2018 0959   ACIDBASEDEF 5.0 (H) 02/02/2018 0959   O2SAT 99.0 02/02/2018 0959   CBG (last 3)  Recent Labs    02/05/18 0320 02/05/18 0713 02/05/18 1109  GLUCAP 90 80 146*    Assessment/Plan: S/P Procedure(s) (LRB): VIDEO ASSISTED THORACOSCOPY (Right) PLEURAL BIOPSY (Right) DRAINAGE OF PLEURAL EFFUSION (Right) TALC  PLEURADESIS (Right)  1. CV-NSR in the 90s with BBB hx PAF, BP well controlled. On Multaq 400mg  BID. Continue Lopressor 12.5mg  BID.  2. Pulm-tolerating room air with good oxygen saturation. Waiting for CXR. 3. Renal-creatinine 1.37, trending down. Watch potassium 5.0 today. Will discontinue supplementation. Does not need lasix since he is euvolemic.  4. H and H is stable. 9.3/29.4 5. Endo-blood glucose is well controlled. Does not appear to be a diabetic.  Plan: Discharge to SNF once we have the CXR results.   CXR appears stable. Okay to discharge to SNF. Will need a Tramadol prescription.    LOS: 16 days    Elgie Collard 02/07/2018  patient seen and CXR reviewed Mild R pleural effusion Breathing comfortably and ok for transfer to [place]  patient examined and medical record reviewed,agree with above note. Tharon Aquas Trigt III 02/07/2018

## 2018-02-07 NOTE — Care Management Important Message (Signed)
Important Message  Patient Details  Name: Barry Curry MRN: [id number] Date of Birth: 04-Sep-1940   Medicare Important Message Given:  Yes    Delorse Lek 02/07/2018, 3:57 PM

## 2018-02-07 NOTE — Clinical Social Work Placement (Signed)
   CLINICAL SOCIAL WORK PLACEMENT  NOTE  Date:  02/07/2018  Patient Details  Name: Barry Curry MRN: [id number] Date of Birth: January 08, 1941  Clinical Social Work is seeking post-discharge placement for this patient at the [place] level of care (*CSW will initial, date and re-position this form in  chart as items are completed):  Yes   Patient/family provided with [place] Work Department's list of facilities offering this level of care within the geographic area requested by the patient (or if unable, by the patient's family).  Yes   Patient/family informed of their freedom to choose among providers that offer the needed level of care, that participate in Medicare, Medicaid or managed care program needed by the patient, have an available bed and are willing to accept the patient.  Yes   Patient/family informed of [place]'s ownership interest in [facility] and [facility], as well as of the fact that they are under no obligation to receive care at these facilities.  PASRR submitted to EDS on 01/25/18     PASRR number received on 01/25/18     Existing PASRR number confirmed on       FL2 transmitted to all facilities in geographic area requested by pt/family on 01/25/18     FL2 transmitted to all facilities within larger geographic area on       Patient informed that his/her managed care company has contracts with or will negotiate with certain facilities, including the following:        Yes   Patient/family informed of bed offers received.  Patient chooses bed at [place], [place]     Physician recommends and patient chooses bed at      Patient to be transferred to [place] on 02/07/18.  Patient to be transferred to facility by [facility]     Patient family notified on 02/07/18 of transfer.  Name of family member notified:  Tivis Ringer     PHYSICIAN Please prepare prescriptions     Additional  Comment:    _______________________________________________ Candie Chroman, LCSW 02/07/2018, 3:30 PM

## 2018-02-07 NOTE — Clinical Social Work Note (Signed)
CSW facilitated patient discharge including contacting patient family and facility to confirm patient discharge plans. Clinical information faxed to facility and family agreeable with plan. CSW arranged ambulance transport via [facility] to [facility]. RN to call report prior to discharge [phone] Room 203).  CSW will sign off for now as social work intervention is no longer needed. Please consult [place] again if new needs arise.  Dayton Scrape, [place]

## 2018-02-07 NOTE — Discharge Instructions (Addendum)
We ask the SNF to please do the following: 1. Please obtain vital signs at least one time daily 2.Please weigh the patient daily. If he or she continues to gain weight or develops lower extremity edema, ,or has worsening shortness of breath,contact the office at [phone]. 3. Ambulate patient at least three times daily and please use sternal precautions.  Thoracotomy, Care After  This sheet gives you information about how to care for yourself after your procedure. Your doctor may also give you more specific instructions. If you have problems or questions, contact your doctor. Follow these instructions at home: Preventing lung infection ( pneumonia)  Take deep breaths or do breathing exercises as told by your doctor.  Cough often. Coughing is important to clear thick spit (phlegm) and open your lungs. If coughing hurts, hold a pillow against your chest or place both hands flat on top of your cut (splinting) when you cough. This may help with discomfort.  Use an incentive spirometer as told. This is a tool that measures how well you fill your lungs with each breath.  Do lung therapy (pulmonary rehabilitation) as told. Medicines  Take over-the-counter or prescription medicines only as told by your doctor.  If you have pain, take pain-relieving medicine before your pain gets very bad. This will help you breathe and cough more comfortably.  If you were prescribed an antibiotic medicine, take it as told by your doctor. Do not stop taking the antibiotic even if you start to feel better. Activity  Ask your doctor what activities are safe for you.  Do not travel by airplane for 2 weeks after your chest tube is removed, or until your doctor says that this is safe.  Do not lift anything that is heavier than 10 lb (4.5 kg), or the limit that your doctor tells you, until he or she says that it is safe.  Do not drive until your doctor approves. ? Do not drive or use heavy machinery while  taking prescription pain medicine. Incision care  Follow instructions from your doctor about how to take care of your cut from surgery (incision). Make sure you: ? Wash your hands with soap and water before you change your bandage (dressing). If you cannot use soap and water, use hand sanitizer. ? Change your bandage as told by your doctor. ? Leave stitches (sutures), skin glue, or skin tape (adhesive) strips in place. They may need to stay in place for 2 weeks or longer. If tape strips get loose and curl up, you may trim the loose edges. Do not remove tape strips completely unless your doctor says it is okay.  Keep your bandage dry.  Check your cut from surgery every day for signs of infection. Check for: ? More redness, swelling, or pain. ? More fluid or blood. ? Warmth. ? Pus or a bad smell. Bathing  Do not take baths, swim, or use a hot tub until your doctor approves. You may take showers.  After your bandage has been removed, use soap and water to gently wash your cut from surgery. Do not use anything else to clean your cut unless your doctor tells you to. Eating and drinking  Eat a healthy diet as told by your doctor. A healthy diet includes: ? Fresh fruits and vegetables. ? Whole grains. ? Low-fat (lean) proteins.  Drink enough fluid to keep your pee (urine) clear or pale yellow. General instructions  To prevent or treat trouble pooping (constipation) while you are taking prescription  pain medicine, your doctor may recommend that you: ? Take over-the-counter or prescription medicines. ? Eat foods that are high in fiber. These include fresh fruits and vegetables, whole grains, and beans. ? Limit foods that are high in fat and processed sugars, such as fried and sweet foods.  Do not use any products that contain nicotine or tobacco. These include cigarettes and e-cigarettes. If you need help quitting, ask your doctor.  Avoid secondhand smoke.  Wear compression stockings as  told. These help to prevent blood clots and reduce swelling in your legs.  If you have a chest tube, care for it as told.  Keep all follow-up visits as told by your doctor. This is important. Contact a doctor if:  You have more redness, swelling, or pain around your cut from surgery.  You have more fluid or blood coming from your cut from surgery.  Your cut from surgery feels warm to the touch.  You have pus or a bad smell coming from your cut from surgery.  You have a fever or chills.  Your heartbeat seems uneven.  You feel sick to your stomach (nauseous).  You throw up (vomit).  You have muscle aches.  You have trouble pooping (having a bowel movement). This may mean that you: ? Poop fewer times in a week than normal. ? Have a hard time pooping. ? Have poop that is dry, hard, or bigger than normal. Get help right away if:  You get a rash.  You feel light-headed.  You feel like you might pass out (faint).  You are short of breath.  You have trouble breathing.  You are confused.  You have trouble talking.  You have problems with your seeing (vision).  You are not able to move.  You lose feeling (have numbness) in your: ? Face. ? Arms. ? Legs.  You pass out.  You have a sudden, bad headache.  You feel weak.  You have chest pain.  You have pain that: ? Is very bad. ? Gets worse, even with medicine. Summary  Take deep breaths, do breathing exercises, and cough often. This helps prevent lung infection (pneumonia).  Do not drive until your doctor approves. Do not travel by airplane for 2 weeks after your chest tube is removed, or until your doctor says that this is safe.  Check your cut from surgery every day for signs of infection.  Eat a healthy diet. This includes fresh fruits and vegetables, whole grains, and low-fat (lean) proteins. This information is not intended to replace advice given to you by your health care provider. Make sure you  discuss any questions you have with your health care provider. Document Released: 12/15/2011 Document Revised: 03/09/2016 Document Reviewed: 03/09/2016 Elsevier Interactive Patient Education  2017 [place].   Additional discharge instructions  Please get your medications reviewed and adjusted by your Primary MD.  Please request your Primary MD to go over all Hospital Tests and Procedure/Radiological results at the follow up, please get all Hospital records sent to your Prim MD by signing hospital release before you go home.  If you had Pneumonia of Lung problems at the Hospital: Please get a 2 view Chest X ray done in 6-8 weeks after hospital discharge or sooner if instructed by your Primary MD.  If you have Congestive Heart Failure: Please call your Cardiologist or Primary MD anytime you have any of the following symptoms:  1) 3 pound weight gain in 24 hours or 5 pounds in 1  week  2) shortness of breath, with or without a dry hacking cough  3) swelling in the hands, feet or stomach  4) if you have to sleep on extra pillows at night in order to breathe  Follow cardiac low salt diet and 1.5 lit/day fluid restriction.  If you have diabetes Accuchecks 4 times/day, Once in AM empty stomach and then before each meal. Log in all results and show them to your primary doctor at your next visit. If any glucose reading is under 80 or above 300 call your primary MD immediately.  If you have Seizure/Convulsions/Epilepsy: Please do not drive, operate heavy machinery, participate in activities at heights or participate in high speed sports until you have seen by Primary MD or a Neurologist and advised to do so again.  If you had Gastrointestinal Bleeding: Please ask your Primary MD to check a complete blood count within one week of discharge or at your next visit. Your endoscopic/colonoscopic biopsies that are pending at the time of discharge, will also need to followed by your Primary  MD.  Get Medicines reviewed and adjusted. Please take all your medications with you for your next visit with your Primary MD  Please request your Primary MD to go over all hospital tests and procedure/radiological results at the follow up, please ask your Primary MD to get all Hospital records sent to his/her office.  If you experience worsening of your admission symptoms, develop shortness of breath, life threatening emergency, suicidal or homicidal thoughts you must seek medical attention immediately by calling 911 or calling your MD immediately  if symptoms less severe.  You must read complete instructions/literature along with all the possible adverse reactions/side effects for all the Medicines you take and that have been prescribed to you. Take any new Medicines after you have completely understood and accpet all the possible adverse reactions/side effects.   Do not drive or operate heavy machinery when taking Pain medications.   Do not take more than prescribed Pain, Sleep and Anxiety Medications  Special Instructions: If you have smoked or chewed Tobacco  in the last 2 yrs please stop smoking, stop any regular Alcohol  and or any Recreational drug use.  Wear Seat belts while driving.  Please note You were cared for by a hospitalist during your hospital stay. If you have any questions about your discharge medications or the care you received while you were in the hospital after you are discharged, you can call the unit and asked to speak with the hospitalist on call if the hospitalist that took care of you is not available. Once you are discharged, your primary care physician will handle any further medical issues. Please note that NO REFILLS for any discharge medications will be authorized once you are discharged, as it is imperative that you return to your primary care physician (or establish a relationship with a primary care physician if you do not have one) for your aftercare needs so  that they can reassess your need for medications and monitor your lab values.  You can reach the hospitalist office at phone [phone] or fax [phone]   If you do not have a primary care physician, you can call [phone] for a physician referral.

## 2018-02-07 NOTE — Progress Notes (Signed)
PROGRESS NOTE    Barry Curry  DXI:[id number] DOB: 07/03/1940 DOA: 01/21/2018 PCP: [name], PA-C    Brief Narrative:  Barry Curry a 77 y.o.malewith medical history significant ofhypertension, hyperlipidemia, GERD, depression, anxiety, atrial fibrillation not on anticoagulant, CAD, PVC, aortic stenosis, RBBB, iron deficiency anemia, CKD, clear renal cell cancer (left nephrectomy), melanoma, OSA who presented with shortness of breath. 3 weeks prior patient had a right thoracentesis at [facility] with 670 cc of bloody fluid removed. During this admission on 01/22/2018 patient had a ultrasound-guided right thoracentesis with 2 L of bloody fluid removed. Pleural fluid sent for Gram stain, culture, cytology. Pulmonology consulted to assess for recurrence of pleural effusion. Exudative pleural fluid noted. He underwent repeat thoracentesis on 7/31 for recurrent pleural effusion, with cytology showing reactive mesothelial cells.  TCTS consulted and patient underwent right video-assisted thoracoscopy, biopsy of pleural masses and talc pleurodesis 8/6.  He was extubated postprocedure but then became very anxious and tachypneic for which she was put on low-dose Precedex for pain control but became obtunded and hypercapnic for which she was reintubated and admitted to ICU.  Extubated 8/7.  Pleural fluid 8/3 shows atypical cells.  Pleural mass biopsy consistent with metastatic renal cell carcinoma.  Patient being discharged today to SNF.  Outpatient follow-up with TCTS, Oncology and Cardiology.   Assessment & Plan:   Principal Problem:   Recurrent right pleural effusion Active Problems:   Essential hypertension   GERD   Acute respiratory failure with hypoxia (HCC)   PAF (paroxysmal atrial fibrillation) (HCC)   Clear cell carcinoma of kidney, left (HCC)   Anxiety   CAD (coronary artery disease)   Iron deficiency anemia   Elevated troponin   CKD (chronic kidney disease),  stage III (HCC)   Malnutrition of moderate degree   S/P thoracotomy   Acute respiratory failure with hypoxia secondary to recurrent malignant pleural effusion  - IR guided thoracentesis 4 times in the last one month. - exudative, gram stain negative, culture negative and cytology showing reactive mesothelial cells on 7/31 but atypical cells on 8/3. - Post VATS required reintubation on 8/6 and admission to ICU.  Extubated 8/7.  Incentive spirometry.  Avoid narcotics (sensitive). - Pro calcitonin level was negative, indicative of non infective process but in view of the consolidative process and persistent leukocytosis and reactive mesothelial cell on cytology, as per discussion with ID, completed 5 days of IV cefepime for possible HCAP.  Pleural culture results from 7/27: Negative. - TCTS consulted and patient underwent right video-assisted thoracoscopy, biopsy of pleural masses and talc pleurodesis 8/6.   -Overnight 8/8, developed worsening dyspnea.  Received a couple doses of IV Lasix, 1 unit of PRBC and chest tube was removed 8/9. -Respiratory status improved.  Hypoxia has resolved.  - TCTS have seen him and cleared him for discharge to SNF with close outpatient follow-up with them and oncology.  Patient is currently on TCTS service and as per my discussion with them, they will kindly do his discharge paperwork to SNF.  Paroxysmal atrial fibrillation:  On Dronedarone.  Likely not anticoagulation candidate due to hemorrhagic pleural effusion.  Resumed metoprolol 8/8.  Multifactorial mild sinus tachycardia is improved.  Now in sinus rhythm.  Stable. Recommend outpatient close cardiology consultation and follow-up.  Also need to consider reducing his dose of Dronedarone which was discussed with TCTS.  Iron deficiency anemia/ acute blood loss anemia:  IV iron and aranesp ordered by oncology.  Transfused total of  3 units PRBC this admission.  Hemoglobin stable in the low 9 g range for the last 2  days.  Periodically follow CBCs.  Essential hypertension:  Controlled.  Continue metoprolol.  CAD:  No chest pain or sob. Resumed BB.   GERD  stable.   Acute on Stage 3 CKD.  Creatinine which had normalized to 1.17 has increased to 1.35 on 8/10 and 1.43 on 8/11, likely related to couple doses of IV Lasix that he has been receiving.  No significant volume overload.  Discontinued IV Lasix on 8/11.  Creatinine has improved slightly to 1.37.  Recommend follow-up of CBC and BMP in a couple days at [facility]  Leukocytosis:  - procalcitonin negative, urine culture showed insignificant growth, blood cultures negative, pleural fluid culture negative.  No clinical suspicion for active infection.  Completed course of IV antibiotics.  Suspected to be reactive.  Slowly improving.  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  Dr Marin Olp on board and as per his follow-up 8/9, pleural biopsy pathology came back positive for metastatic renal cell carcinoma.  Management per oncology, outpatient follow-up..  Moderate Protein Calorie malnutrition:  - nutrition consultation and input appreciated.  Continue nutritional supplements.   DVT prophylaxis: SCD'S Code Status: full code.  Family Communication: Discussed in detail with patient's 2 daughters at bedside.  Updated care and answered questions. Disposition Plan: Patient stable for discharge to SNF.  Consultants:   Oncology  TCTS  Procedures:  IR thoracentesis on 7/31,  8/6 VATS, with pleural biopsy, talc pleurodesis.  Right chest tube, removed 8/9. Foley catheter -DC'd 8/8 Arterial line DC'd 8/8. Oral ET tube-discontinued 8/7. RUE PICC line 8/7>  Antimicrobials: cefepime since 8/2 >8/6  Subjective: Patient denies complaints.  No dyspnea or pain reported.  Somewhat weak.  Had BM.  Appetite still poor.  No dizziness, lightheadedness, or palpitations.  2 daughters at bedside.   Objective: Vitals:    02/07/18 0004 02/07/18 0345 02/07/18 0742 02/07/18 1130  BP: 99/63 117/70 (!) 108/55 106/61  Pulse: 87 95 (!) 109 97  Resp: (!) 27 (!) 25 (!) 22 (!) 31  Temp: 98.7 F (37.1 C) 97.6 F (36.4 C) 98.1 F (36.7 C) 97.8 F (36.6 C)  TempSrc: Oral Oral Oral Oral  SpO2: 94% 95% 96% 95%  Weight:      Height:        Intake/Output Summary (Last 24 hours) at 02/07/2018 1450 Last data filed at 02/07/2018 2263 Gross per 24 hour  Intake 180 ml  Output 1121 ml  Net -941 ml   Filed Weights   01/21/18 1107 02/04/18 0640 02/06/18 0425  Weight: 92.1 kg 94.4 kg 91.9 kg    Examination:  General exam: Pleasant elderly male, moderately built and nourished lying comfortably supine in bed.Marland Kitchen Respiratory system: Diminished breath sounds in the right base.  Rest of lung fields clear to auscultation.  Palpable crepitus right chest wall from subcutaneous emphysema, slowly decreasing.  No increased work of breathing. Cardiovascular system: S1 and S2 heard, RRR.  No JVD.  Short systolic murmur 2/6 best heard at apex..  No lower extremity edema.  Upper extremity edema has resolved.   Gastrointestinal system: Abdomen is nondistended, soft and nontender.  Normal bowel sounds heard.  No organomegaly or masses appreciated.  Stable Central nervous system: Alert and oriented.  No focal neurological deficits.  Stable Extremities: Moves all extremities symmetrically.   Skin: No rashes, lesions or ulcers Psychiatry: Flat affect.  Data Reviewed: I have personally reviewed following labs and imaging studies  CBC: Recent Labs  Lab 02/03/18 0400 02/04/18 0306 02/05/18 0304 02/06/18 0537 02/07/18 0446  WBC 17.0* 17.8* 18.2* 16.7* 15.7*  HGB 8.0* 8.3* 9.1* 9.2* 9.3*  HCT 24.9* 26.7* 28.2* 29.1* 29.4*  MCV 86.2 87.5 84.4 85.3 85.0  PLT 178 191 184 219 294   Basic Metabolic Panel: Recent Labs  Lab 02/02/18 0417 02/03/18 0400 02/05/18 0304 02/06/18 0537 02/07/18 0446  NA 138 140 137 137 137  K 4.6 4.3  4.7 4.5 5.0  CL 106 110 105 102 103  CO2 20* 22 25 26 27   GLUCOSE 125* 91 90 87 86  BUN 22 24* 27* 28* 29*  CREATININE 1.17 1.17 1.35* 1.43* 1.37*  CALCIUM 7.9* 7.4* 7.9* 8.2* 8.0*  MG  --   --   --  2.3  --    GFR: Estimated Creatinine Clearance: 49.6 mL/min (A) (by C-G formula based on SCr of 1.37 mg/dL (H)). Liver Function Tests: Recent Labs  Lab 02/01/18 0302 02/03/18 0400  AST 25 31  ALT 19 17  ALKPHOS 80 56  BILITOT 0.9 0.7  PROT 5.6* 4.1*  ALBUMIN 2.3* 1.7*   CBG: Recent Labs  Lab 02/04/18 1948 02/04/18 2333 02/05/18 0320 02/05/18 0713 02/05/18 1109  GLUCAP 92 124* 90 80 146*      Radiology Studies: Dg Chest 2 View  Result Date: 02/07/2018 CLINICAL DATA:  Evaluate for pneumothorax. Recent VATS procedure with pleural biopsy and talc pleurodesis. EXAM: CHEST - 2 VIEW COMPARISON:  02/05/2018 FINDINGS: There continues to be a large amount of subcutaneous gas throughout the right side of the chest. The amount of subcutaneous gas is similar to the recent comparison examination. Lateral view demonstrates a small gas-filled collection along the posterior aspect of the right chest. This may represent a small focus of loculated pleural air. No large pneumothorax. Patchy pleural and parenchymal densities throughout the right chest have minimally changed. Left lung appears to be clear. Difficult to exclude a small left effusion. Heart size is within normal limits and stable. Dual chamber cardiac pacemaker is again noted but the leads are incompletely visualized. Right arm PICC line tip in the lower SVC and stable. There are bilateral shoulder replacements. IMPRESSION: Scattered pleural and parenchymal densities throughout the right chest have minimally changed. Evidence for a small gas collection in the posterior right chest which may represent loculated pleural air. Overall, there is not a large pneumothorax. Stable subcutaneous emphysema along the right side of the chest and neck.  Electronically Signed   By: Markus Daft M.D.   On: 02/07/2018 09:00        Scheduled Meds: . Chlorhexidine Gluconate Cloth  6 each Topical Daily  . dronedarone  400 mg Oral BID WC  . feeding supplement (ENSURE ENLIVE)  237 mL Oral TID BM  . ferrous sulfate  325 mg Oral Q breakfast  . fluticasone  2 spray Each Nare QHS  . folic acid  2 mg Oral Daily  . lipase/protease/amylase  72,000 Units Oral BID AC  . mouth rinse  15 mL Mouth Rinse BID  . megestrol  400 mg Oral BID  . metoprolol tartrate  12.5 mg Oral BID  . pantoprazole  40 mg Oral Daily  . polyethylene glycol  17 g Oral BID  . senna-docusate  2 tablet Oral BID  . sodium chloride flush  10-40 mL Intracatheter Q12H  . tamsulosin  0.4 mg Oral QPC supper  .  vitamin B-6  25 mg Oral BID   Continuous Infusions: . potassium chloride       LOS: 16 days    Time spent: 35 minutes.    Vernell Leep, MD, FACP, [facility]. [facility] Pager [phone]  If 7PM-7AM, please contact night-coverage [email or web address] Password [email or web address] 02/07/2018, 2:50 PM

## 2018-02-16 ENCOUNTER — Inpatient Hospital Stay (HOSPITAL_BASED_OUTPATIENT_CLINIC_OR_DEPARTMENT_OTHER)
Admission: EM | Admit: 2018-02-16 | Discharge: 2018-02-23 | DRG: 871 | Disposition: A | Discharge status: Hospice | Payer: Medicare Other | Attending: Family Medicine | Admitting: Family Medicine

## 2018-02-16 ENCOUNTER — Other Ambulatory Visit: Payer: Self-pay

## 2018-02-16 ENCOUNTER — Encounter (HOSPITAL_BASED_OUTPATIENT_CLINIC_OR_DEPARTMENT_OTHER): Payer: Self-pay | Admitting: *Deleted

## 2018-02-16 ENCOUNTER — Emergency Department (HOSPITAL_BASED_OUTPATIENT_CLINIC_OR_DEPARTMENT_OTHER): Payer: Medicare Other

## 2018-02-16 ENCOUNTER — Inpatient Hospital Stay (HOSPITAL_BASED_OUTPATIENT_CLINIC_OR_DEPARTMENT_OTHER): Payer: Medicare Other | Admitting: Hematology & Oncology

## 2018-02-16 ENCOUNTER — Inpatient Hospital Stay: Payer: Medicare Other | Attending: Hematology & Oncology

## 2018-02-16 ENCOUNTER — Encounter: Payer: Self-pay | Admitting: Hematology & Oncology

## 2018-02-16 VITALS — BP 99/52 | HR 114 | Temp 97.4°F | Resp 32 | Wt 190.0 lb

## 2018-02-16 DIAGNOSIS — J90 Pleural effusion, not elsewhere classified: Secondary | ICD-10-CM | POA: Diagnosis not present

## 2018-02-16 DIAGNOSIS — R0602 Shortness of breath: Secondary | ICD-10-CM

## 2018-02-16 DIAGNOSIS — Z905 Acquired absence of kidney: Secondary | ICD-10-CM | POA: Diagnosis not present

## 2018-02-16 DIAGNOSIS — Z7951 Long term (current) use of inhaled steroids: Secondary | ICD-10-CM

## 2018-02-16 DIAGNOSIS — I2510 Atherosclerotic heart disease of native coronary artery without angina pectoris: Secondary | ICD-10-CM | POA: Diagnosis present

## 2018-02-16 DIAGNOSIS — Z66 Do not resuscitate: Secondary | ICD-10-CM | POA: Diagnosis present

## 2018-02-16 DIAGNOSIS — I429 Cardiomyopathy, unspecified: Secondary | ICD-10-CM | POA: Diagnosis present

## 2018-02-16 DIAGNOSIS — Y95 Nosocomial condition: Secondary | ICD-10-CM | POA: Diagnosis present

## 2018-02-16 DIAGNOSIS — N189 Chronic kidney disease, unspecified: Secondary | ICD-10-CM | POA: Diagnosis not present

## 2018-02-16 DIAGNOSIS — R630 Anorexia: Secondary | ICD-10-CM | POA: Diagnosis not present

## 2018-02-16 DIAGNOSIS — E43 Unspecified severe protein-calorie malnutrition: Secondary | ICD-10-CM

## 2018-02-16 DIAGNOSIS — D6489 Other specified anemias: Secondary | ICD-10-CM | POA: Diagnosis present

## 2018-02-16 DIAGNOSIS — Z87891 Personal history of nicotine dependence: Secondary | ICD-10-CM | POA: Diagnosis not present

## 2018-02-16 DIAGNOSIS — C642 Malignant neoplasm of left kidney, except renal pelvis: Secondary | ICD-10-CM

## 2018-02-16 DIAGNOSIS — Z85528 Personal history of other malignant neoplasm of kidney: Secondary | ICD-10-CM

## 2018-02-16 DIAGNOSIS — C782 Secondary malignant neoplasm of pleura: Secondary | ICD-10-CM | POA: Diagnosis present

## 2018-02-16 DIAGNOSIS — Z79899 Other long term (current) drug therapy: Secondary | ICD-10-CM | POA: Diagnosis not present

## 2018-02-16 DIAGNOSIS — F40240 Claustrophobia: Secondary | ICD-10-CM | POA: Diagnosis present

## 2018-02-16 DIAGNOSIS — L8990 Pressure ulcer of unspecified site, unspecified stage: Secondary | ICD-10-CM | POA: Diagnosis present

## 2018-02-16 DIAGNOSIS — A419 Sepsis, unspecified organism: Secondary | ICD-10-CM | POA: Diagnosis present

## 2018-02-16 DIAGNOSIS — R531 Weakness: Secondary | ICD-10-CM

## 2018-02-16 DIAGNOSIS — Z7952 Long term (current) use of systemic steroids: Secondary | ICD-10-CM

## 2018-02-16 DIAGNOSIS — J181 Lobar pneumonia, unspecified organism: Secondary | ICD-10-CM | POA: Diagnosis present

## 2018-02-16 DIAGNOSIS — Z96611 Presence of right artificial shoulder joint: Secondary | ICD-10-CM | POA: Diagnosis present

## 2018-02-16 DIAGNOSIS — N183 Chronic kidney disease, stage 3 unspecified: Secondary | ICD-10-CM | POA: Diagnosis present

## 2018-02-16 DIAGNOSIS — I480 Paroxysmal atrial fibrillation: Secondary | ICD-10-CM | POA: Diagnosis present

## 2018-02-16 DIAGNOSIS — Z96643 Presence of artificial hip joint, bilateral: Secondary | ICD-10-CM | POA: Diagnosis present

## 2018-02-16 DIAGNOSIS — Z85820 Personal history of malignant melanoma of skin: Secondary | ICD-10-CM

## 2018-02-16 DIAGNOSIS — Z96612 Presence of left artificial shoulder joint: Secondary | ICD-10-CM | POA: Diagnosis present

## 2018-02-16 DIAGNOSIS — J189 Pneumonia, unspecified organism: Secondary | ICD-10-CM | POA: Diagnosis not present

## 2018-02-16 DIAGNOSIS — D508 Other iron deficiency anemias: Secondary | ICD-10-CM

## 2018-02-16 DIAGNOSIS — R0600 Dyspnea, unspecified: Secondary | ICD-10-CM

## 2018-02-16 DIAGNOSIS — L89322 Pressure ulcer of left buttock, stage 2: Secondary | ICD-10-CM | POA: Diagnosis present

## 2018-02-16 DIAGNOSIS — J9601 Acute respiratory failure with hypoxia: Secondary | ICD-10-CM | POA: Diagnosis present

## 2018-02-16 DIAGNOSIS — R05 Cough: Secondary | ICD-10-CM | POA: Diagnosis not present

## 2018-02-16 DIAGNOSIS — Z96653 Presence of artificial knee joint, bilateral: Secondary | ICD-10-CM | POA: Diagnosis present

## 2018-02-16 DIAGNOSIS — F329 Major depressive disorder, single episode, unspecified: Secondary | ICD-10-CM | POA: Diagnosis present

## 2018-02-16 DIAGNOSIS — E875 Hyperkalemia: Secondary | ICD-10-CM | POA: Diagnosis present

## 2018-02-16 DIAGNOSIS — I129 Hypertensive chronic kidney disease with stage 1 through stage 4 chronic kidney disease, or unspecified chronic kidney disease: Secondary | ICD-10-CM | POA: Diagnosis present

## 2018-02-16 DIAGNOSIS — D631 Anemia in chronic kidney disease: Secondary | ICD-10-CM | POA: Diagnosis not present

## 2018-02-16 DIAGNOSIS — R0682 Tachypnea, not elsewhere classified: Secondary | ICD-10-CM

## 2018-02-16 DIAGNOSIS — N2889 Other specified disorders of kidney and ureter: Secondary | ICD-10-CM

## 2018-02-16 DIAGNOSIS — E440 Moderate protein-calorie malnutrition: Secondary | ICD-10-CM

## 2018-02-16 DIAGNOSIS — D500 Iron deficiency anemia secondary to blood loss (chronic): Secondary | ICD-10-CM

## 2018-02-16 DIAGNOSIS — E785 Hyperlipidemia, unspecified: Secondary | ICD-10-CM | POA: Diagnosis present

## 2018-02-16 DIAGNOSIS — I2583 Coronary atherosclerosis due to lipid rich plaque: Secondary | ICD-10-CM | POA: Diagnosis not present

## 2018-02-16 DIAGNOSIS — I080 Rheumatic disorders of both mitral and aortic valves: Secondary | ICD-10-CM | POA: Diagnosis present

## 2018-02-16 DIAGNOSIS — R4182 Altered mental status, unspecified: Secondary | ICD-10-CM | POA: Diagnosis not present

## 2018-02-16 LAB — CMP (CANCER CENTER ONLY)
ALBUMIN: 2.7 g/dL — AB (ref 3.5–5.0)
ALK PHOS: 92 U/L — AB (ref 26–84)
ALT: 31 U/L (ref 10–47)
AST: 32 U/L (ref 11–38)
Anion gap: 9 (ref 5–15)
BUN: 27 mg/dL — AB (ref 7–22)
CO2: 25 mmol/L (ref 18–33)
CREATININE: 1.3 mg/dL — AB (ref 0.60–1.20)
Calcium: 9.1 mg/dL (ref 8.0–10.3)
Chloride: 103 mmol/L (ref 98–108)
GLUCOSE: 92 mg/dL (ref 73–118)
Potassium: 5.2 mmol/L — ABNORMAL HIGH (ref 3.3–4.7)
SODIUM: 137 mmol/L (ref 128–145)
TOTAL PROTEIN: 6.6 g/dL (ref 6.4–8.1)
Total Bilirubin: 0.6 mg/dL (ref 0.2–1.6)

## 2018-02-16 LAB — CBC WITH DIFFERENTIAL (CANCER CENTER ONLY)
BASOS ABS: 0.2 10*3/uL — AB (ref 0.0–0.1)
Basophils Relative: 1 %
EOS ABS: 0 10*3/uL (ref 0.0–0.5)
EOS PCT: 0 %
HCT: 32.8 % — ABNORMAL LOW (ref 38.7–49.9)
HEMOGLOBIN: 10.5 g/dL — AB (ref 13.0–17.1)
Lymphocytes Relative: 3 %
Lymphs Abs: 0.6 10*3/uL — ABNORMAL LOW (ref 0.9–3.3)
MCH: 26.6 pg — ABNORMAL LOW (ref 28.0–33.4)
MCHC: 32 g/dL (ref 32.0–35.9)
MCV: 83 fL (ref 82.0–98.0)
MONO ABS: 2.6 10*3/uL — AB (ref 0.1–0.9)
Monocytes Relative: 14 %
NEUTROS PCT: 82 %
Neutro Abs: 15.5 10*3/uL — ABNORMAL HIGH (ref 1.5–6.5)
Platelet Count: 366 10*3/uL (ref 145–400)
RBC: 3.95 MIL/uL — ABNORMAL LOW (ref 4.20–5.70)
RDW: 18.2 % — ABNORMAL HIGH (ref 11.1–15.7)
WBC Count: 18.9 10*3/uL — ABNORMAL HIGH (ref 4.0–10.0)

## 2018-02-16 LAB — CBC WITH DIFFERENTIAL/PLATELET
Band Neutrophils: 2 %
Basophils Absolute: 0 K/uL (ref 0.0–0.1)
Basophils Relative: 0 %
Eosinophils Absolute: 0.2 K/uL (ref 0.0–0.7)
Eosinophils Relative: 1 %
HCT: 30.6 % — ABNORMAL LOW (ref 39.0–52.0)
Hemoglobin: 10 g/dL — ABNORMAL LOW (ref 13.0–17.0)
Lymphocytes Relative: 3 %
Lymphs Abs: 0.6 K/uL — ABNORMAL LOW (ref 0.7–4.0)
MCH: 26.8 pg (ref 26.0–34.0)
MCHC: 32.7 g/dL (ref 30.0–36.0)
MCV: 82 fL (ref 78.0–100.0)
Monocytes Absolute: 2.1 K/uL — ABNORMAL HIGH (ref 0.1–1.0)
Monocytes Relative: 11 %
Neutro Abs: 16.6 K/uL — ABNORMAL HIGH (ref 1.7–7.7)
Neutrophils Relative %: 83 %
Platelets: 346 K/uL (ref 150–400)
RBC: 3.73 MIL/uL — ABNORMAL LOW (ref 4.22–5.81)
RDW: 18.7 % — ABNORMAL HIGH (ref 11.5–15.5)
WBC: 19.5 K/uL — ABNORMAL HIGH (ref 4.0–10.5)

## 2018-02-16 LAB — URINALYSIS, ROUTINE W REFLEX MICROSCOPIC
Bilirubin Urine: NEGATIVE
Glucose, UA: NEGATIVE mg/dL
Hgb urine dipstick: NEGATIVE
Ketones, ur: NEGATIVE mg/dL
Leukocytes, UA: NEGATIVE
Nitrite: NEGATIVE
Protein, ur: NEGATIVE mg/dL
Specific Gravity, Urine: 1.01 (ref 1.005–1.030)
pH: 6.5 (ref 5.0–8.0)

## 2018-02-16 LAB — COMPREHENSIVE METABOLIC PANEL WITH GFR
ALT: 28 U/L (ref 0–44)
AST: 35 U/L (ref 15–41)
Albumin: 2.6 g/dL — ABNORMAL LOW (ref 3.5–5.0)
Alkaline Phosphatase: 84 U/L (ref 38–126)
Anion gap: 9 (ref 5–15)
BUN: 30 mg/dL — ABNORMAL HIGH (ref 8–23)
CO2: 22 mmol/L (ref 22–32)
Calcium: 8.6 mg/dL — ABNORMAL LOW (ref 8.9–10.3)
Chloride: 104 mmol/L (ref 98–111)
Creatinine, Ser: 1.32 mg/dL — ABNORMAL HIGH (ref 0.61–1.24)
GFR calc Af Amer: 58 mL/min — ABNORMAL LOW
GFR calc non Af Amer: 50 mL/min — ABNORMAL LOW
Glucose, Bld: 91 mg/dL (ref 70–99)
Potassium: 5.4 mmol/L — ABNORMAL HIGH (ref 3.5–5.1)
Sodium: 135 mmol/L (ref 135–145)
Total Bilirubin: 0.7 mg/dL (ref 0.3–1.2)
Total Protein: 6.5 g/dL (ref 6.5–8.1)

## 2018-02-16 LAB — I-STAT CG4 LACTIC ACID, ED: Lactic Acid, Venous: 1.39 mmol/L (ref 0.5–1.9)

## 2018-02-16 MED ORDER — LEVOFLOXACIN IN D5W 750 MG/150ML IV SOLN
750.0000 mg | Freq: Once | INTRAVENOUS | Status: AC
  Administered 2018-02-16: 750 mg via INTRAVENOUS
  Filled 2018-02-16: qty 150

## 2018-02-16 MED ORDER — IOPAMIDOL (ISOVUE-370) INJECTION 76%
100.0000 mL | Freq: Once | INTRAVENOUS | Status: AC | PRN
  Administered 2018-02-16: 100 mL via INTRAVENOUS

## 2018-02-16 MED ORDER — LACTATED RINGERS IV BOLUS
1000.0000 mL | Freq: Once | INTRAVENOUS | Status: AC
  Administered 2018-02-16: 1000 mL via INTRAVENOUS

## 2018-02-16 MED ORDER — LORAZEPAM 2 MG/ML IJ SOLN
1.0000 mg | Freq: Once | INTRAMUSCULAR | Status: AC
  Administered 2018-02-16: 1 mg via INTRAVENOUS
  Filled 2018-02-16: qty 1

## 2018-02-16 MED ORDER — ORAL CARE MOUTH RINSE
15.0000 mL | Freq: Two times a day (BID) | OROMUCOSAL | Status: DC
  Administered 2018-02-17 – 2018-02-23 (×9): 15 mL via OROMUCOSAL

## 2018-02-16 NOTE — ED Provider Notes (Signed)
Emergency Department Provider Note   I have reviewed the triage vital signs and the nursing notes.   HISTORY  Chief Complaint Shortness of Breath   HPI Barry Curry is a 77 y.o. 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  He went to rehabilitation where she was doing okay initially but the last few days had decreased appetite decreased urine output, increased shortness of breath, weakness and temperature of 99.7.  Was following up with Dr. Marin Olp today for a scheduled follow-up and was found to be tachycardic with some soft blood pressure so sent here for further evaluation.  No chest pain or abdominal pain.  No vomiting or diarrhea.  Does have dark urine and decreased output but no burning with urination.  No significant pain anywhere.  The daughters are with him and states that he does look like he is having worsening shortness of breath over the last couple days as well.  He apparently had a CBC and x-ray done yesterday that showed pneumonia with a white count of 21.  Started on Levaquin. No other associated or modifying symptoms.    Past Medical History:  Diagnosis Date  . Anemia    history of  . Anxiety   . Aortic stenosis   . Arthritis   . Cancer ([place])    Melanoma back of neck  . Cancer of kidney ([place])   . Cardiomyopathy ([place])   . 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 Complication of anesthesia    violent wake up from anesthesia  . Coronary artery disease   . Depression   . Dyspnea   . Esophageal stricture   . Goals of care, counseling/discussion 11/17/2017  . Heart murmur   . History of atrial fibrillation   . History of claustrophobia   . Hyperlipemia   . Hypertension   . Left kidney mass   . Lower extremity edema   . Mitral stenosis     . PVC (premature ventricular contraction)   . RBBB (right bundle branch block with left anterior fascicular block)     Patient Active Problem List   Diagnosis Date Noted  . Healthcare-associated pneumonia 02/16/2018  . HCAP (healthcare-associated pneumonia) 02/16/2018  . S/P thoracotomy 02/01/2018  . Malnutrition of moderate degree 01/24/2018  . Recurrent right pleural effusion 01/21/2018  . Anxiety 01/21/2018  . CAD (coronary artery disease) 01/21/2018  . Iron deficiency anemia 01/21/2018  . Elevated troponin 01/21/2018  . CKD (chronic kidney disease), stage III ([place]) 01/21/2018  . 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 Goals of care, counseling/discussion 11/17/2017  . Kidney mass 07/23/2017  . Acute respiratory failure with hypoxia ([place]) 07/23/2017  . Subcutaneous emphysema ([place]) 07/23/2017  . PAF (paroxysmal atrial fibrillation) ([place]) 07/23/2017  . History of pulmonary embolism 07/23/2017  . Essential hypertension 03/02/2007  . GERD 03/02/2007    Past Surgical History:  Procedure Laterality Date  . CARDIOVERSION    . COLONOSCOPY    . ESOPHAGEAL DILATION    . IR THORACENTESIS ASP PLEURAL SPACE W/IMG GUIDE  01/26/2018  . KNEE ARTHROSCOPY Left   . KNEE ARTHROSCOPY Right   . PLEURAL BIOPSY Right 02/01/2018   Procedure: PLEURAL BIOPSY;  Surgeon: Gaye Pollack, MD;  Location: [facility];  Service: Thoracic;  Laterality:  Right;  Marland Kitchen PLEURAL EFFUSION DRAINAGE Right 02/01/2018   Procedure: DRAINAGE OF PLEURAL EFFUSION;  Surgeon: Gaye Pollack, MD;  Location: [facility];  Service: Thoracic;  Laterality: Right;  . ROBOT ASSISTED LAPAROSCOPIC NEPHRECTOMY Left 07/23/2017   Procedure: XI ROBOTIC ASSISTED LAPAROSCOPIC NEPHRECTOMY;  Surgeon: Alexis Frock, MD;  Location: [facility];  Service: Urology;  Laterality: Left;  . SHOULDER ARTHROSCOPY N/A   . TALC PLEURODESIS Right 02/01/2018   Procedure: Pietro Cassis;  Surgeon: Gaye Pollack, MD;  Location[facility] OR;  Service: Thoracic;   Laterality: Right;  . TOTAL HIP ARTHROPLASTY Left   . TOTAL HIP ARTHROPLASTY Right   . TOTAL KNEE ARTHROPLASTY Left   . TOTAL KNEE ARTHROPLASTY Right   . TOTAL SHOULDER REPLACEMENT Left   . TOTAL SHOULDER REPLACEMENT Right   . UPPER GI ENDOSCOPY    . VIDEO ASSISTED THORACOSCOPY Right 02/01/2018   Procedure: VIDEO ASSISTED THORACOSCOPY;  Surgeon: Gaye Pollack, MD;  Location: [place];  Service: Thoracic;  Laterality: Right;      Allergies Penicillins  Family History  Problem Relation Age of Onset  . Breast cancer Mother   . Diabetes Mellitus II Father   . Diabetes Mellitus II Sister     Social History Social History   Tobacco Use  . Smoking status: Former Smoker    Packs/day: 0.20    Years: 2.00    Pack years: 0.40    Types: Cigarettes    Last attempt to quit: 07/16/1971    Years since quitting: 46.6  . Smokeless tobacco: Never Used  Substance Use Topics  . Alcohol use: Yes    Comment: daily Scotch  . Drug use: No    Review of Systems  All other systems negative except as documented in the HPI. All pertinent positives and negatives as reviewed in the HPI. ____________________________________________   PHYSICAL EXAM:  VITAL SIGNS: Vitals:   02/16/18 2200 02/16/18 2300  BP:  (!) 123/56  Pulse: (!) 112 (!) 121  Resp: (!) 30 (!) 41  Temp:  98.6 F (37 C)  SpO2: 96% 96%    Constitutional: Alert and oriented. Chronically ill-appearing and in no acute distress. Eyes: Conjunctivae are normal. PERRL. EOMI. Eyes sunken. Head: Atraumatic. Nose: No congestion/rhinnorhea. Mouth/Throat: Mucous membranes are moist.  Oropharynx non-erythematous. Neck: No stridor.  No meningeal signs.   Cardiovascular: Normal rate, regular rhythm. Good peripheral circulation. Grossly normal heart sounds.   Respiratory: Normal respiratory effort.  No retractions. Lungs diminished R>L. Gastrointestinal: Soft and nontender. No distention.  Musculoskeletal: No lower extremity tenderness  nor edema. No gross deformities of extremities. Neurologic:  Normal speech and language. No gross focal neurologic deficits are appreciated.  Skin:  Skin is warm, dry and intact. No rash noted. Reported sacral pressure ulcers are without purulence per daughter who changed dressing today and reports they look similarly worse than baseline.    ____________________________________________   LABS (all labs ordered are listed, but only abnormal results are displayed)  Labs Reviewed  COMPREHENSIVE METABOLIC PANEL - Abnormal; Notable for the following components:      Result Value   Potassium 5.4 (*)    BUN 30 (*)    Creatinine, Ser 1.32 (*)    Calcium 8.6 (*)    Albumin 2.6 (*)    GFR calc non Af Amer 50 (*)    GFR calc Af Amer 58 (*)    All other components within normal limits  CBC WITH DIFFERENTIAL/PLATELET - Abnormal; Notable for the following  components:   WBC 19.5 (*)    RBC 3.73 (*)    Hemoglobin 10.0 (*)    HCT 30.6 (*)    RDW 18.7 (*)    Neutro Abs 16.6 (*)    Lymphs Abs 0.6 (*)    Monocytes Absolute 2.1 (*)    All other components within normal limits  CULTURE, BLOOD (ROUTINE X 2)  CULTURE, BLOOD (ROUTINE X 2)  MRSA PCR SCREENING  URINALYSIS, ROUTINE W REFLEX MICROSCOPIC  I-STAT CG4 LACTIC ACID, ED  I-STAT CG4 LACTIC ACID, ED   ____________________________________________  EKG  My ECG Read Indication:sob EKG was personally contemporaneously reviewed by myself. Rate: 108 PR Interval: 159 QRS duration: 139 QT/QTC: 375/503 Axis: left EKG: sinus tachycardia, LBBB. Other significant findings: repol changes  ____________________________________________  RADIOLOGY  Dg Chest 2 View  Result Date: 02/16/2018 CLINICAL DATA:  Shortness of breath. History of melanoma and renal cell carcinoma EXAM: CHEST - 2 VIEW COMPARISON:  February 07, 2018 FINDINGS: There is loculated pleural effusion on the right inferolaterally. There is also fluid tracking along the right major  fissure. The left lung is clear. Heart size and pulmonary vascularity are normal. There is less subcutaneous air on the right compared to most recent study. Pacemaker leads are attached to the right atrium and right ventricle. There is aortic atherosclerosis. No evident adenopathy. 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 Central catheter has been removed.  No evident pneumothorax. IMPRESSION: Persistent loculated effusion on the right, most notably along the inferolateral aspect. Underlying airspace opacity could be obscured by this effusion. Left lung clear. Stable cardiac silhouette. There is aortic atherosclerosis. Pacemaker leads attached to right atrium and right ventricle. Aortic Atherosclerosis (ICD10-I70.0). Electronically Signed   By: Lowella Grip III M.D.   On: 02/16/2018 18:01   Ct Angio Chest Pe W And/or Wo Contrast  Result Date: 02/16/2018 CLINICAL DATA:  Cough. Pneumonia. Recurrent pleural effusions of the right lung. History of metastatic renal carcinoma. Left nephrectomy performed in January 2019. EXAM: CT ANGIOGRAPHY CHEST WITH CONTRAST TECHNIQUE: Multidetector CT imaging of the chest was performed using the standard protocol during bolus administration of intravenous contrast. Multiplanar CT image reconstructions and MIPs were obtained to evaluate the vascular anatomy. CONTRAST:  166mL ISOVUE-370 IOPAMIDOL (ISOVUE-370) INJECTION 76% COMPARISON:  Current chest radiograph and prior chest radiographs. FINDINGS: Cardiovascular: There is satisfactory opacification of the pulmonary arteries to the segmental level. There is no evidence of a pulmonary embolism. Heart is normal size. No pericardial effusion. Mild left coronary artery calcifications. Great vessels are normal in caliber. Mild aortic atherosclerosis. 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 Mediastinum/Nodes: Neck base assessment limited by artifact from bilateral shoulder  prosthesis. No visualized neck base or axillary mass or adenopathy. There is pleural based opacity that encroaches upon the right aspect of the mediastinum. There is no convincing mediastinal based mass. No discrete enlarged lymph node. Trachea is patent. Esophagus is mildly distended. Lungs/Pleura: With a small amount of associated loculated pleural based fluid. There are cavitary lesions in the right lower lobe with air-fluid levels. Some fluid tracks along the right oblique fissure. No left pleural effusion or pleural based masses. 4 mm nodule in the right upper lobe, image 55, series 5. Patchy consolidation noted in the posterior right upper lobe adjacent to the cavitary lesions mild peribronchovascular opacity in the left lower lobe this most likely subsegmental atelectasis. Upper Abdomen: 2.6 x 2.0 cm right adrenal mass. 2 cm hypoattenuating mass  in the posterior segment of the right liver lobe, which is likely a cyst. No acute findings in the upper abdomen. Musculoskeletal: No fractures or acute finding. No osteoblastic or osteolytic lesions. Subcutaneous emphysema seen along the right chest wall, decreased from older prior chest radiographs. Review of the MIP images confirms the above findings. IMPRESSION: 1. No evidence of a pulmonary embolism. 2. Extensive right hemithorax pleural based masses consistent pleural based metastatic disease. 3. Small amount of loculated right pleural fluid. Cystic lesions in the right lower lobe with adjacent patchy consolidation. These latter areas of abnormality may reflect infection or cavitary neoplastic disease. 4. 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 Aortic Atherosclerosis (ICD10-I70.0). Electronically Signed   By: Lajean Manes M.D.   On: 02/16/2018 19:40    ____________________________________________   PROCEDURES  Procedure(s) performed:   Procedures  CRITICAL CARE Performed by: Merrily Pew Total critical care time: 35  minutes Critical care time was exclusive of separately billable procedures and treating other patients. Critical care was necessary to treat or prevent imminent or life-threatening deterioration. Critical care was time spent personally by me on the following activities: development of treatment plan with patient and/or surrogate as well as nursing, discussions with consultants, evaluation of patient's response to treatment, examination of patient, obtaining history from patient or surrogate, ordering and performing treatments and interventions, ordering and review of laboratory studies, ordering and review of radiographic studies, pulse oximetry and re-evaluation of patient's condition.  ____________________________________________   INITIAL IMPRESSION / ASSESSMENT AND PLAN / ED COURSE  Sepsis workup. Also dehydrated likely playing a large role. Will likely need admission to hospital for same.   Antibiotic started.  CT scan with evidence of small effusion and likely infectious pneumonia.  Discussed with his cardiothoracic surgeon who stated he did not foresee any need for cardiothoracic intervention and could be admitted to [facility] long as per Dr. Antonieta Pert wishes.  Patient DNR/DNI but does not require BiPAP at this time.  Discussed with hospitalist will admit.  I discussed the situation with his daughter who is understanding.     Pertinent labs & imaging results that were available during my care of the patient were reviewed by me and considered in my medical decision making (see chart for details).  ____________________________________________  FINAL CLINICAL IMPRESSION(S) / ED DIAGNOSES  Final diagnoses:  Shortness of breath  Dyspnea, unspecified type  Community acquired pneumonia of right lower lobe of lung ([place])  Sepsis, due to unspecified organism [facility])     MEDICATIONS GIVEN DURING THIS VISIT:  Medications  MEDLINE mouth rinse (15 mLs Mouth Rinse Not Given 02/16/18 2333)   levofloxacin (LEVAQUIN) IVPB 750 mg (0 mg Intravenous Stopped 02/16/18 1845)  lactated ringers bolus 1,000 mL ( Intravenous Stopped 02/16/18 1945)  LORazepam (ATIVAN) injection 1 mg (1 mg Intravenous Given 02/16/18 1849)  iopamidol (ISOVUE-370) 76 % injection 100 mL (100 mLs Intravenous Contrast Given 02/16/18 1856)     NEW OUTPATIENT MEDICATIONS STARTED DURING THIS VISIT:  Current Discharge Medication List      Note:  This note was prepared with assistance of Dragon voice recognition software. Occasional wrong-word or sound-a-like substitutions may have occurred due to the inherent limitations of voice recognition software.   Iviana Blasingame, Corene Cornea, MD 02/17/18 0000

## 2018-02-16 NOTE — ED Triage Notes (Signed)
Sent here from [place] office for SOB/ Pneumonia

## 2018-02-16 NOTE — Progress Notes (Signed)
Hematology and Oncology Follow Up Visit  Barry Curry [id number] 06-07-1941 77 y.o. 02/16/2018   Principle Diagnosis:   Metastatic renal cell carcinoma - (+) pleural bx.  Current Therapy:    Status post laparoscopic left radical nephrectomy-07/23/2017          Interim History:  Barry Curry is back for follow-up.  Unfortunately, we now have metastatic kidney cancer.  Barry Curry was hospitalized over at [facility].  Barry Curry had recurrent right pleural effusions.  Despite several thoracentesis, fluid always returned  negative.  Barry Curry had a VATS procedure done.  This showed that Barry Curry did have metastatic disease.  The pathology report ([id number]) showed metastatic poorly differentiated carcinoma that was consistent with his renal cell carcinoma.  Barry Curry was then sent to a rehab facility.  Barry Curry had been doing fairly well there but then apparently began to develop some cough and shortness of breath.  Barry Curry had a chest x-ray there and was found to have pneumonia.  Barry Curry was started on some antibiotics.  Barry Curry comes in today and Barry Curry is very weak.  Barry Curry just does not look like Barry Curry is getting better.  I know that Barry Curry is getting good care at [facility].  However, I just feel that Barry Curry is going to have to be admitted again.  It is very conceivable that the cancer could be causing problems.  Barry Curry is not eating much.  Barry Curry just does not have much of an appetite.  There is been no nausea or vomiting.  Barry Curry has a pacemaker but yet his family says that his heart rate is been in the 120s-130s.  Barry Curry is not complained of any pain.  Again, I think that admission is going to be necessary if we are to get him better.    I did send off his biopsy specimen for molecular analysis.  We do not have the results back yet.  I think the only option that we could even consider for him would be immunotherapy.  I do not think that Barry Curry is strong enough to be able to handle this yet.  Overall, his performance status is ECOG 3.   Medications:  Current  Outpatient Medications:  .  acetaminophen (TYLENOL) 500 MG tablet, Take 500 mg by mouth daily. , Disp: , Rfl:  .  diclofenac sodium (VOLTAREN) 1 % GEL, Apply 4 g topically as needed (joint pain). , Disp: , Rfl:  .  dronedarone (MULTAQ) 400 MG tablet, Take 0.5 tablets (200 mg total) by mouth 2 (two) times daily with a meal., Disp: 60 tablet, Rfl: 0 .  ertapenem (INVANZ) 1 g injection, Inject 1 g into the muscle daily., Disp: , Rfl:  .  fluticasone (FLONASE) 50 MCG/ACT nasal spray, Place 2 sprays into both nostrils at bedtime., Disp: , Rfl:  .  IRON PO, Take 325 mg by mouth daily. , Disp: , Rfl:  .  levofloxacin (LEVAQUIN) 500 MG tablet, Take 500 mg by mouth daily., Disp: , Rfl:  .  lipase/protease/amylase (CREON) 36000 UNITS CPEP capsule, Take 2 capsules (72,000 Units total) by mouth 2 (two) times daily before lunch and supper., Disp: 180 capsule, Rfl: 4 .  loperamide (IMODIUM) 2 MG capsule, Take 2 mg by mouth as needed. , Disp: , Rfl:  .  megestrol (MEGACE) 400 MG/10ML suspension, Take 10 mLs (400 mg total) by mouth 2 (two) times daily., Disp: 240 mL, Rfl: 0 .  Melatonin 3 MG TABS, Take 3 mg by mouth at bedtime., Disp: , Rfl:  .  metoprolol tartrate (LOPRESSOR) 25 MG tablet, Take 0.5 tablets (12.5 mg total) by mouth 2 (two) times daily., Disp: 30 tablet, Rfl: 0 .  pantoprazole (PROTONIX) 40 MG tablet, Take 40 mg by mouth daily., Disp: , Rfl:  .  tamsulosin (FLOMAX) 0.4 MG CAPS capsule, Take 0.4 mg by mouth daily after supper., Disp: , Rfl:  .  traMADol (ULTRAM) 50 MG tablet, Take 1 tablet (50 mg total) by mouth every 6 (six) hours as needed (mild pain)., Disp: 30 tablet, Rfl: 0 .  vitamin B-6 (PYRIDOXINE) 25 MG tablet, Take 25 mg by mouth 2 (two) times daily., Disp: , Rfl:   Allergies:  Allergies  Allergen Reactions  . Penicillins Rash    Has patient had a PCN reaction causing immediate rash, facial/tongue/throat swelling, SOB or lightheadedness with hypotension: No Has patient had a PCN  reaction causing severe rash involving mucus membranes or skin necrosis: No Has patient had a PCN reaction that required hospitalization: No Has patient had a PCN reaction occurring within the last 10 years: No If all of the above answers are "NO", then may proceed with Cephalosporin use.     Past Medical History, Surgical history, Social history, and Family History were reviewed and updated.  Review of Systems: Review of Systems  Constitutional: Positive for fatigue.  HENT:  Negative.   Eyes: Negative.   Respiratory: Negative.   Cardiovascular: Positive for palpitations.  Gastrointestinal: Negative.   Endocrine: Negative.   Genitourinary: Negative.    Musculoskeletal: Negative.   Skin: Negative.   Neurological: Negative.   Hematological: Negative.   Psychiatric/Behavioral: Negative.     Physical Exam:  weight is 190 lb (86.2 kg). His oral temperature is 97.4 F (36.3 C) (abnormal). His blood pressure is 99/52 (abnormal) and his pulse is 114 (abnormal). His respiration is 32 (abnormal) and oxygen saturation is 96%.   Wt Readings from Last 3 Encounters:  02/16/18 190 lb (86.2 kg)  02/06/18 202 lb 9.6 oz (91.9 kg)  01/12/18 203 lb 8 oz (92.3 kg)    Physical Exam  Constitutional: Barry Curry is oriented to person, place, and time.  HENT:  Head: Normocephalic and atraumatic.  Mouth/Throat: Oropharynx is clear and moist.  Eyes: Pupils are equal, round, and reactive to light. EOM are normal.  Neck: Normal range of motion.  Cardiovascular: Normal rate, regular rhythm and normal heart sounds.  Pulmonary/Chest: Effort normal and breath sounds normal.  Abdominal: Soft. Bowel sounds are normal.  Musculoskeletal: Normal range of motion. Barry Curry exhibits no edema, tenderness or deformity.  Lymphadenopathy:    Barry Curry has no cervical adenopathy.  Neurological: Barry Curry is alert and oriented to person, place, and time.  Skin: Skin is warm and dry. No rash noted. No erythema.  Psychiatric: Barry Curry has a normal  mood and affect. His behavior is normal. Judgment and thought content normal.  Vitals reviewed.    Lab Results  Component Value Date   WBC 18.9 (H) 02/16/2018   HGB 10.5 (L) 02/16/2018   HCT 32.8 (L) 02/16/2018   MCV 83.0 02/16/2018   PLT 366 02/16/2018     Chemistry      Component Value Date/Time   NA 137 02/16/2018 1458   K 5.2 (H) 02/16/2018 1458   CL 103 02/16/2018 1458   CO2 25 02/16/2018 1458   BUN 27 (H) 02/16/2018 1458   CREATININE 1.30 (H) 02/16/2018 1458      Component Value Date/Time   CALCIUM 9.1 02/16/2018 1458   ALKPHOS 92 (H) 02/16/2018 1458  AST 32 02/16/2018 1458   ALT 31 02/16/2018 1458   BILITOT 0.6 02/16/2018 1458       Impression and Plan: Barry Curry is a 77 year old white male.  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  This was resected at the end of January.  I think the fact that Barry Curry now has metastatic disease is incredibly troubling.  It is been only 7 months that Barry Curry has had a disease-free interval.  The fact that this is come back in his pleura and has caused the pleural effusions is quite troubling.  The pathology shows this is a poorly differentiated cancer.  Again, I am not sure if we will be able to treat him.  I think that Barry Curry is going to have to get stronger.  I think in the hospital, Barry Curry will need IV antibiotics.  Barry Curry will need to have nutrition see him.  I think physical therapy is a must.  I spent about 45 minutes with Barry Curry and his family.  This all was face-to-face.  I counseled them.  I talked about his situation.  I briefly talked about immunotherapy.  I will plan to see him in the hospital.   Volanda Napoleon, MD 8/21/20194:17 PM

## 2018-02-17 ENCOUNTER — Other Ambulatory Visit: Payer: Self-pay

## 2018-02-17 ENCOUNTER — Inpatient Hospital Stay (HOSPITAL_COMMUNITY): Payer: Medicare Other

## 2018-02-17 ENCOUNTER — Encounter (HOSPITAL_COMMUNITY): Payer: Self-pay | Admitting: Family Medicine

## 2018-02-17 DIAGNOSIS — L8990 Pressure ulcer of unspecified site, unspecified stage: Secondary | ICD-10-CM | POA: Diagnosis present

## 2018-02-17 DIAGNOSIS — N189 Chronic kidney disease, unspecified: Secondary | ICD-10-CM

## 2018-02-17 DIAGNOSIS — E43 Unspecified severe protein-calorie malnutrition: Secondary | ICD-10-CM

## 2018-02-17 DIAGNOSIS — R4182 Altered mental status, unspecified: Secondary | ICD-10-CM

## 2018-02-17 DIAGNOSIS — J9601 Acute respiratory failure with hypoxia: Secondary | ICD-10-CM

## 2018-02-17 DIAGNOSIS — C642 Malignant neoplasm of left kidney, except renal pelvis: Secondary | ICD-10-CM

## 2018-02-17 DIAGNOSIS — J90 Pleural effusion, not elsewhere classified: Secondary | ICD-10-CM

## 2018-02-17 DIAGNOSIS — J189 Pneumonia, unspecified organism: Secondary | ICD-10-CM

## 2018-02-17 DIAGNOSIS — C782 Secondary malignant neoplasm of pleura: Secondary | ICD-10-CM

## 2018-02-17 DIAGNOSIS — D631 Anemia in chronic kidney disease: Secondary | ICD-10-CM

## 2018-02-17 LAB — BASIC METABOLIC PANEL
ANION GAP: 7 (ref 5–15)
BUN: 26 mg/dL — AB (ref 8–23)
CHLORIDE: 106 mmol/L (ref 98–111)
CO2: 25 mmol/L (ref 22–32)
Calcium: 8.6 mg/dL — ABNORMAL LOW (ref 8.9–10.3)
Creatinine, Ser: 1.29 mg/dL — ABNORMAL HIGH (ref 0.61–1.24)
GFR calc Af Amer: >60 mL/min (ref >60)
GFR, EST NON AFRICAN AMERICAN: 52 mL/min — AB (ref >60)
Glucose, Bld: 81 mg/dL (ref 70–99)
Potassium: 5.3 mmol/L — ABNORMAL HIGH (ref 3.5–5.1)
SODIUM: 138 mmol/L (ref 135–145)

## 2018-02-17 LAB — IRON AND TIBC
IRON: 14 ug/dL — AB (ref 42–163)
Saturation Ratios: 9 % — ABNORMAL LOW (ref 42–163)
TIBC: 159 ug/dL — AB (ref 202–409)
UIBC: 145 ug/dL

## 2018-02-17 LAB — CBC WITH DIFFERENTIAL/PLATELET
BASOS PCT: 0 %
Basophils Absolute: 0 10*3/uL (ref 0.0–0.1)
EOS ABS: 0.1 10*3/uL (ref 0.0–0.7)
Eosinophils Relative: 0 %
HCT: 29.8 % — ABNORMAL LOW (ref 39.0–52.0)
Hemoglobin: 9.5 g/dL — ABNORMAL LOW (ref 13.0–17.0)
Lymphocytes Relative: 11 %
Lymphs Abs: 1.7 10*3/uL (ref 0.7–4.0)
MCH: 26.6 pg (ref 26.0–34.0)
MCHC: 31.9 g/dL (ref 30.0–36.0)
MCV: 83.5 fL (ref 78.0–100.0)
MONO ABS: 1.1 10*3/uL — AB (ref 0.1–1.0)
Monocytes Relative: 7 %
Neutro Abs: 13.6 10*3/uL — ABNORMAL HIGH (ref 1.7–7.7)
Neutrophils Relative %: 82 %
Platelets: 343 10*3/uL (ref 150–400)
RBC: 3.57 MIL/uL — ABNORMAL LOW (ref 4.22–5.81)
RDW: 18.1 % — AB (ref 11.5–15.5)
WBC: 16.5 10*3/uL — ABNORMAL HIGH (ref 4.0–10.5)

## 2018-02-17 LAB — MRSA PCR SCREENING: MRSA BY PCR: NEGATIVE

## 2018-02-17 LAB — FERRITIN: Ferritin: 2879 ng/mL — ABNORMAL HIGH (ref 24–336)

## 2018-02-17 LAB — MAGNESIUM: MAGNESIUM: 2.1 mg/dL (ref 1.7–2.4)

## 2018-02-17 LAB — GLUCOSE, CAPILLARY: GLUCOSE-CAPILLARY: 84 mg/dL (ref 70–99)

## 2018-02-17 LAB — I-STAT CG4 LACTIC ACID, ED: LACTIC ACID, VENOUS: 2.03 mmol/L — AB (ref 0.5–1.9)

## 2018-02-17 MED ORDER — LORAZEPAM 2 MG/ML IJ SOLN
INTRAMUSCULAR | Status: AC
  Administered 2018-02-17: 0.5 mg
  Filled 2018-02-17: qty 1

## 2018-02-17 MED ORDER — ACETAMINOPHEN 650 MG RE SUPP: 650.0000 mg | Freq: Four times a day (QID) | RECTAL | Status: DC | PRN

## 2018-02-17 MED ORDER — HYDROCODONE-ACETAMINOPHEN 5-325 MG PO TABS
1.0000 | ORAL_TABLET | ORAL | Status: DC | PRN
  Administered 2018-02-20: 1 via ORAL
  Filled 2018-02-17: qty 1

## 2018-02-17 MED ORDER — METOPROLOL TARTRATE 12.5 MG HALF TABLET: 12.5000 mg | ORAL_TABLET | Freq: Two times a day (BID) | ORAL | Status: DC

## 2018-02-17 MED ORDER — SODIUM CHLORIDE 0.9 % IV SOLN
INTRAVENOUS | Status: AC
  Administered 2018-02-17: 03:00:00 via INTRAVENOUS

## 2018-02-17 MED ORDER — DARBEPOETIN ALFA 300 MCG/0.6ML IJ SOSY
300.0000 ug | PREFILLED_SYRINGE | Freq: Once | INTRAMUSCULAR | Status: DC
  Filled 2018-02-17: qty 0.6

## 2018-02-17 MED ORDER — PANTOPRAZOLE SODIUM 40 MG PO TBEC
40.0000 mg | DELAYED_RELEASE_TABLET | Freq: Every day | ORAL | Status: DC
  Administered 2018-02-18 – 2018-02-23 (×6): 40 mg via ORAL
  Filled 2018-02-17 (×6): qty 1

## 2018-02-17 MED ORDER — HEPARIN SODIUM (PORCINE) 5000 UNIT/ML IJ SOLN
5000.0000 [IU] | Freq: Three times a day (TID) | INTRAMUSCULAR | Status: DC
  Administered 2018-02-17 – 2018-02-23 (×17): 5000 [IU] via SUBCUTANEOUS
  Filled 2018-02-17 (×18): qty 1

## 2018-02-17 MED ORDER — VANCOMYCIN HCL 10 G IV SOLR
1250.0000 mg | INTRAVENOUS | Status: DC
  Administered 2018-02-18: 1250 mg via INTRAVENOUS
  Filled 2018-02-17: qty 1250

## 2018-02-17 MED ORDER — PYRIDOXINE HCL 25 MG PO TABS
25.0000 mg | ORAL_TABLET | Freq: Two times a day (BID) | ORAL | Status: DC
  Administered 2018-02-18 – 2018-02-23 (×11): 25 mg via ORAL
  Filled 2018-02-17 (×13): qty 1

## 2018-02-17 MED ORDER — METOPROLOL TARTRATE 5 MG/5ML IV SOLN
2.5000 mg | Freq: Two times a day (BID) | INTRAVENOUS | Status: DC
  Administered 2018-02-17 – 2018-02-18 (×3): 2.5 mg via INTRAVENOUS
  Filled 2018-02-17 (×3): qty 5

## 2018-02-17 MED ORDER — MORPHINE SULFATE (PF) 2 MG/ML IV SOLN
1.0000 mg | Freq: Once | INTRAVENOUS | Status: AC
  Administered 2018-02-17: 1 mg via INTRAVENOUS

## 2018-02-17 MED ORDER — SODIUM CHLORIDE 0.9 % IV SOLN
1.0000 g | Freq: Three times a day (TID) | INTRAVENOUS | Status: DC
  Administered 2018-02-17 – 2018-02-23 (×19): 1 g via INTRAVENOUS
  Filled 2018-02-17 (×20): qty 1

## 2018-02-17 MED ORDER — SODIUM CHLORIDE 0.9 % IV SOLN: 2.0000 g | Freq: Three times a day (TID) | INTRAVENOUS | Status: DC

## 2018-02-17 MED ORDER — MORPHINE SULFATE (PF) 2 MG/ML IV SOLN
INTRAVENOUS | Status: AC
  Filled 2018-02-17: qty 1

## 2018-02-17 MED ORDER — SODIUM CHLORIDE 0.9 % IV SOLN
INTRAVENOUS | Status: DC | PRN
  Administered 2018-02-17: 1000 mL via INTRAVENOUS
  Administered 2018-02-19: 250 mL via INTRAVENOUS

## 2018-02-17 MED ORDER — PANCRELIPASE (LIP-PROT-AMYL) 36000-114000 UNITS PO CPEP
72000.0000 [IU] | ORAL_CAPSULE | Freq: Two times a day (BID) | ORAL | Status: DC
  Administered 2018-02-18 – 2018-02-22 (×10): 72000 [IU] via ORAL
  Filled 2018-02-17 (×12): qty 2

## 2018-02-17 MED ORDER — ONDANSETRON HCL 4 MG/2ML IJ SOLN
4.0000 mg | Freq: Four times a day (QID) | INTRAMUSCULAR | Status: DC | PRN
  Administered 2018-02-19: 4 mg via INTRAVENOUS
  Filled 2018-02-17: qty 2

## 2018-02-17 MED ORDER — GERHARDT'S BUTT CREAM: 1.0000 "application " | TOPICAL_CREAM | Freq: Three times a day (TID) | CUTANEOUS | Status: DC | PRN

## 2018-02-17 MED ORDER — ENSURE ENLIVE PO LIQD
237.0000 mL | Freq: Three times a day (TID) | ORAL | Status: DC
  Administered 2018-02-17 – 2018-02-23 (×15): 237 mL via ORAL

## 2018-02-17 MED ORDER — ACETAMINOPHEN 325 MG PO TABS: 650.0000 mg | ORAL_TABLET | Freq: Four times a day (QID) | ORAL | Status: DC | PRN

## 2018-02-17 MED ORDER — MELATONIN 3 MG PO TABS
3.0000 mg | ORAL_TABLET | Freq: Every day | ORAL | Status: DC
  Administered 2018-02-17 – 2018-02-22 (×6): 3 mg via ORAL
  Filled 2018-02-17 (×7): qty 1

## 2018-02-17 MED ORDER — SODIUM CHLORIDE 0.9% FLUSH
3.0000 mL | Freq: Two times a day (BID) | INTRAVENOUS | Status: DC
  Administered 2018-02-17 – 2018-02-23 (×12): 3 mL via INTRAVENOUS

## 2018-02-17 MED ORDER — VANCOMYCIN HCL 10 G IV SOLR
2000.0000 mg | Freq: Once | INTRAVENOUS | Status: AC
  Administered 2018-02-17: 2000 mg via INTRAVENOUS
  Filled 2018-02-17: qty 2000

## 2018-02-17 MED ORDER — SODIUM CHLORIDE 0.9 % IV SOLN
510.0000 mg | Freq: Once | INTRAVENOUS | Status: AC
  Administered 2018-02-17: 510 mg via INTRAVENOUS
  Filled 2018-02-17: qty 17

## 2018-02-17 MED ORDER — ONDANSETRON HCL 4 MG PO TABS: 4.0000 mg | ORAL_TABLET | Freq: Four times a day (QID) | ORAL | Status: DC | PRN

## 2018-02-17 MED ORDER — TAMSULOSIN HCL 0.4 MG PO CAPS
0.4000 mg | ORAL_CAPSULE | Freq: Every day | ORAL | Status: DC
  Administered 2018-02-18 – 2018-02-22 (×5): 0.4 mg via ORAL
  Filled 2018-02-17 (×5): qty 1

## 2018-02-17 MED ORDER — DRONEDARONE HCL 400 MG PO TABS
200.0000 mg | ORAL_TABLET | Freq: Two times a day (BID) | ORAL | Status: DC
  Administered 2018-02-17 – 2018-02-23 (×12): 200 mg via ORAL
  Filled 2018-02-17 (×14): qty 1

## 2018-02-17 MED ORDER — MEGESTROL ACETATE 400 MG/10ML PO SUSP
400.0000 mg | Freq: Two times a day (BID) | ORAL | Status: DC
  Administered 2018-02-17 – 2018-02-20 (×7): 400 mg via ORAL
  Filled 2018-02-17 (×10): qty 10

## 2018-02-17 MED ORDER — DICLOFENAC SODIUM 1 % TD GEL: 4.0000 g | TRANSDERMAL | Status: DC | PRN

## 2018-02-17 NOTE — Evaluation (Signed)
Physical Therapy Evaluation Patient Details Name: Barry Curry MRN: [id number] DOB: 03-22-1941 Today's Date: 02/17/2018   History of Present Illness  Pt admitted with SOB and dx with HCAP.  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  Pt would benefit from follow up rehab at SNF level to maximize IND and safety.    Follow Up Recommendations SNF    Equipment Recommendations  None recommended by PT    Recommendations for Other Services       Precautions / Restrictions Precautions Precautions: Fall Precaution Comments: chair follow, watch sats Restrictions Weight Bearing Restrictions: No      Mobility  Bed Mobility Overal bed mobility: Needs Assistance Bed Mobility: Supine to Sit     Supine to sit: Min guard     General bed mobility comments: Increased time with use of rails  Transfers Overall transfer level: Needs assistance Equipment used: Rolling walker (2 wheeled) Transfers: Sit to/from Stand Sit to Stand: Min assist         General transfer comment: cues for use of hands; assist to balance/steady in standing  Ambulation/Gait Ambulation/Gait assistance: Min assist;+2 safety/equipment Gait Distance (Feet): 5 Feet Assistive device: Rolling walker (2 wheeled) Gait Pattern/deviations: Decreased stride length;Shuffle;Trunk flexed;Step-to pattern Gait velocity: decreased    General Gait Details: cues for posture and position from RW - ltd by elevating HR and c/o fatigue  Stairs            Wheelchair Mobility    Modified Rankin (Stroke Patients Only)       Balance Overall balance assessment: Needs assistance Sitting-balance support: Feet unsupported;No upper extremity supported Sitting balance-Leahy Scale: Fair      Standing balance support: Bilateral upper extremity supported Standing balance-Leahy Scale: Poor                               Pertinent Vitals/Pain Pain Assessment: No/denies pain    Home Living Family/patient expects to be discharged to:: Skilled nursing facility                      Prior Function Level of Independence: Needs assistance   Gait / Transfers Assistance Needed: ambulates with RW short distances up to 75'     Comments: From SNF     Hand Dominance   Dominant Hand: Right    Extremity/Trunk Assessment   Upper Extremity Assessment Upper Extremity Assessment: Generalized weakness    Lower Extremity Assessment Lower Extremity Assessment: Generalized weakness    Cervical / Trunk Assessment Cervical / Trunk Assessment: Kyphotic  Communication   Communication: No difficulties  Cognition Arousal/Alertness: Awake/alert Behavior During Therapy: WFL for tasks assessed/performed Overall Cognitive Status: Within Functional Limits for tasks assessed                                        General Comments      Exercises     Assessment/Plan    PT Assessment Patient needs continued PT services  PT Problem List Decreased strength;Decreased activity tolerance;Decreased mobility;Cardiopulmonary status limiting activity       PT Treatment Interventions DME instruction;Gait training;Stair training;Functional mobility  training;Therapeutic activities;Therapeutic exercise;Balance training;Cognitive remediation;Patient/family education    PT Goals (Current goals can be found in the Care Plan section)  Acute Rehab PT Goals Patient Stated Goal: Regain IND PT Goal Formulation: With patient Time For Goal Achievement: 02/17/18 Potential to Achieve Goals: Fair    Frequency Min 3X/week   Barriers to discharge        Co-evaluation               AM-PAC PT "6 Clicks" Daily Activity  Outcome Measure Difficulty turning  over in bed (including adjusting bedclothes, sheets and blankets)?: A Little Difficulty moving from lying on back to sitting on the side of the bed? : A Lot Difficulty sitting down on and standing up from a chair with arms (e.g., wheelchair, bedside commode, etc,.)?: Unable Help needed moving to and from a bed to chair (including a wheelchair)?: A Little Help needed walking in hospital room?: A Lot Help needed climbing 3-5 steps with a railing? : Total 6 Click Score: 12    End of Session Equipment Utilized During Treatment: Gait belt Activity Tolerance: Patient limited by fatigue Patient left: in chair;with call bell/phone within reach;with family/visitor present Nurse Communication: Mobility status PT Visit Diagnosis: Unsteadiness on feet (R26.81);Other abnormalities of gait and mobility (R26.89);Muscle weakness (generalized) (M62.81);Difficulty in walking, not elsewhere classified (R26.2)    Time: 3539-1225 PT Time Calculation (min) (ACUTE ONLY): 17 min   Charges:   PT Evaluation $PT Eval Moderate Complexity: 1 Mod          Pg [phone]   Analeya Luallen 02/17/2018, 4:25 PM

## 2018-02-17 NOTE — Consult Note (Addendum)
PULMONARY / CRITICAL CARE MEDICINE   Name: Barry Curry MRN: [id number] DOB: March 15, 1941    ADMISSION DATE:  02/16/2018 CONSULTATION DATE: 02/16/2018  REFERRING MD: Dr. Cordelia Poche  CHIEF COMPLAINT:   Shortness of breath, altered mental status  HISTORY OF PRESENT ILLNESS:    Patient with a history of metastatic kidney cancer, pneumonia Was brought into the hospital for weakness-resided at a nursing home Right pleural effusion, status post VATS procedure-pleural-based mass positive for poorly differentiated cancer-metastasis from his kidney Post nephrectomy in January Has not been doing well generally  He was been stabilized in the stepdown unit While taking his medications today-he basically went unresponsive  Was asked to see him during this episode Struggling with his breathing for a few minutes, his eyes rolled back Oxygen supplementation, medications for anxiety He was able to state that he did not have any chest pains or chest discomfort, just could not catch his breath, weak cough  PAST MEDICAL HISTORY :  He  has a past medical history of Anemia, Anxiety, Aortic stenosis, Arthritis, Cancer ([place]), Cancer of kidney ([place]), Cardiomyopathy ([place]), Clear cell carcinoma of kidney, left ([place]) (0/62/6948), Complication of anesthesia, Coronary artery disease, Depression, Dyspnea, Esophageal stricture, Goals of care, counseling/discussion (11/17/2017), Heart murmur, History of atrial fibrillation, History of claustrophobia, Hyperlipemia, Hypertension, Left kidney mass, Lower extremity edema, Mitral stenosis, PVC (premature ventricular contraction), and RBBB (right bundle branch block with left anterior fascicular block).  PAST SURGICAL HISTORY: He  has a past surgical history that includes Esophageal dilation; Total knee arthroplasty (Left); Total knee arthroplasty (Right); Total hip arthroplasty (Left); Total hip arthroplasty (Right); Total shoulder replacement (Left); Total shoulder  replacement (Right); Shoulder arthroscopy (N/A); Knee arthroscopy (Left); Knee arthroscopy (Right); Cardioversion; Colonoscopy; Upper gi endoscopy; Robot assisted laparoscopic nephrectomy (Left, 07/23/2017); IR THORACENTESIS ASP PLEURAL SPACE W/IMG GUIDE (01/26/2018); Video assisted thoracoscopy (Right, 02/01/2018); Pleural biopsy (Right, 02/01/2018); Pleural effusion drainage (Right, 02/01/2018); and Talc pleurodesis (Right, 02/01/2018).  Allergies  Allergen Reactions  . Penicillins Rash    Has patient had a PCN reaction causing immediate rash, facial/tongue/throat swelling, SOB or lightheadedness with hypotension: No Has patient had a PCN reaction causing severe rash involving mucus membranes or skin necrosis: No Has patient had a PCN reaction that required hospitalization: No Has patient had a PCN reaction occurring within the last 10 years: No If all of the above answers are "NO", then may proceed with Cephalosporin use.     No current facility-administered medications on file prior to encounter.    Current Outpatient Medications on File Prior to Encounter  Medication Sig  . ertapenem (INVANZ) 1 g injection Inject 1 g into the muscle daily.  . feeding supplement (ENSURE CLINICAL STRENGTH) LIQD Take 237 mLs by mouth 3 (three) times daily with meals.  . ferrous sulfate 325 (65 FE) MG EC tablet Take 325 mg by mouth daily with breakfast.  . Hydrocortisone (GERHARDT'S BUTT CREAM) CREA Apply 1 application topically 3 (three) times daily as needed for irritation.  Marland Kitchen liver oil-zinc oxide (DESITIN) 40 % ointment Apply 1 application topically 3 (three) times daily as needed for irritation.  Marland Kitchen acetaminophen (TYLENOL) 500 MG tablet Take 500 mg by mouth daily.   . diclofenac sodium (VOLTAREN) 1 % GEL Apply 4 g topically as needed (joint pain).   Marland Kitchen dronedarone (MULTAQ) 400 MG tablet Take 0.5 tablets (200 mg total) by mouth 2 (two) times daily with a meal.  . fluticasone (FLONASE) 50 MCG/ACT nasal spray Place 2  sprays into  both nostrils at bedtime.  Marland Kitchen levofloxacin (LEVAQUIN) 500 MG tablet Take 500 mg by mouth daily.  . lipase/protease/amylase (CREON) 36000 UNITS CPEP capsule Take 2 capsules (72,000 Units total) by mouth 2 (two) times daily before lunch and supper.  . loperamide (IMODIUM) 2 MG capsule Take 2 mg by mouth daily as needed for diarrhea or loose stools.   . megestrol (MEGACE) 400 MG/10ML suspension Take 10 mLs (400 mg total) by mouth 2 (two) times daily.  . Melatonin 3 MG TABS Take 3 mg by mouth at bedtime.  . metoprolol tartrate (LOPRESSOR) 25 MG tablet Take 0.5 tablets (12.5 mg total) by mouth 2 (two) times daily.  . pantoprazole (PROTONIX) 40 MG tablet Take 40 mg by mouth daily.  . tamsulosin (FLOMAX) 0.4 MG CAPS capsule Take 0.4 mg by mouth daily after supper.  . traMADol (ULTRAM) 50 MG tablet Take 1 tablet (50 mg total) by mouth every 6 (six) hours as needed (mild pain). (Patient not taking: Reported on 02/17/2018)  . vitamin B-6 (PYRIDOXINE) 25 MG tablet Take 25 mg by mouth 2 (two) times daily.    FAMILY HISTORY:  His family history includes Breast cancer in his mother; Diabetes Mellitus II in his father and sister.  SOCIAL HISTORY: He  reports that he quit smoking about 46 years ago. His smoking use included cigarettes. He has a 0.40 pack-year smoking history. He has never used smokeless tobacco. He reports that he drinks alcohol. He reports that he does not use drugs.  REVIEW OF SYSTEMS:   Review of Systems  Constitutional: Positive for malaise/fatigue.  Respiratory: Positive for shortness of breath.   Neurological: Positive for weakness.     SUBJECTIVE:  After a few minutes patient did stabilize, he is still short of breath He was offered a BiPAP several times and he does not want to try a BiPAP His breathing seemed to improve with morphine. He is still tachycardic  VITAL SIGNS: BP (!) 104/55   Pulse 98   Temp 97.9 F (36.6 C) (Oral)   Resp (!) 33   Ht 6' (1.829 m)    Wt 86 kg   SpO2 97%   BMI 25.71 kg/m   INTAKE / OUTPUT: I/O last 3 completed shifts: In: 914.2 [IV Piggyback:914.2] Out: 325 [Urine:325]  PHYSICAL EXAMINATION: General: Awake, alert, oriented to person place Neuro: Moving all extremities HEENT: Moist oral mucosa Cardiovascular: S1-S2 appreciated Lungs: Decreased air entry on the right Abdomen: Bowel sounds appreciated Skin: Skin is warm and dry  LABS:  BMET Recent Labs  Lab 02/16/18 1458 02/16/18 1652 02/17/18 0331  NA 137 135 138  K 5.2* 5.4* 5.3*  CL 103 104 106  CO2 25 22 25   BUN 27* 30* 26*  CREATININE 1.30* 1.32* 1.29*  GLUCOSE 92 91 81    Electrolytes Recent Labs  Lab 02/16/18 1458 02/16/18 1652 02/17/18 0331  CALCIUM 9.1 8.6* 8.6*    CBC Recent Labs  Lab 02/16/18 1458 02/16/18 1652 02/17/18 0331  WBC 18.9* 19.5* 16.5*  HGB 10.5* 10.0* 9.5*  HCT 32.8* 30.6* 29.8*  PLT 366 346 343    Coag's No results for input(s): APTT, INR in the last 168 hours.  Sepsis Markers Recent Labs  Lab 02/16/18 1701 02/16/18 1955  LATICACIDVEN 2.03* 1.39    ABG No results for input(s): PHART, PCO2ART, PO2ART in the last 168 hours.  Liver Enzymes Recent Labs  Lab 02/16/18 1458 02/16/18 1652  AST 32 35  ALT 31 28  ALKPHOS 92*  84  BILITOT 0.6 0.7  ALBUMIN 2.7* 2.6*    Cardiac Enzymes No results for input(s): TROPONINI, PROBNP in the last 168 hours.  Glucose Recent Labs  Lab 02/17/18 0925  GLUCAP 84    Imaging Dg Chest 2 View  Result Date: 02/16/2018 CLINICAL DATA:  Shortness of breath. History of melanoma and renal cell carcinoma EXAM: CHEST - 2 VIEW COMPARISON:  February 07, 2018 FINDINGS: There is loculated pleural effusion on the right inferolaterally. There is also fluid tracking along the right major fissure. The left lung is clear. Heart size and pulmonary vascularity are normal. There is less subcutaneous air on the right compared to most recent study. Pacemaker leads are attached to  the right atrium and right ventricle. There is aortic atherosclerosis. No evident adenopathy. 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 Central catheter has been removed.  No evident pneumothorax. IMPRESSION: Persistent loculated effusion on the right, most notably along the inferolateral aspect. Underlying airspace opacity could be obscured by this effusion. Left lung clear. Stable cardiac silhouette. There is aortic atherosclerosis. Pacemaker leads attached to right atrium and right ventricle. Aortic Atherosclerosis (ICD10-I70.0). Electronically Signed   By: Lowella Grip III M.D.   On: 02/16/2018 18:01   Ct Angio Chest Pe W And/or Wo Contrast  Result Date: 02/16/2018 CLINICAL DATA:  Cough. Pneumonia. Recurrent pleural effusions of the right lung. History of metastatic renal carcinoma. Left nephrectomy performed in January 2019. EXAM: CT ANGIOGRAPHY CHEST WITH CONTRAST TECHNIQUE: Multidetector CT imaging of the chest was performed using the standard protocol during bolus administration of intravenous contrast. Multiplanar CT image reconstructions and MIPs were obtained to evaluate the vascular anatomy. CONTRAST:  144mL ISOVUE-370 IOPAMIDOL (ISOVUE-370) INJECTION 76% COMPARISON:  Current chest radiograph and prior chest radiographs. FINDINGS: Cardiovascular: There is satisfactory opacification of the pulmonary arteries to the segmental level. There is no evidence of a pulmonary embolism. Heart is normal size. No pericardial effusion. Mild left coronary artery calcifications. Great vessels are normal in caliber. Mild aortic atherosclerosis. 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 Mediastinum/Nodes: Neck base assessment limited by artifact from bilateral shoulder prosthesis. No visualized neck base or axillary mass or adenopathy. There is pleural based opacity that encroaches upon the right aspect of the mediastinum. There is no convincing mediastinal  based mass. No discrete enlarged lymph node. Trachea is patent. Esophagus is mildly distended. Lungs/Pleura: With a small amount of associated loculated pleural based fluid. There are cavitary lesions in the right lower lobe with air-fluid levels. Some fluid tracks along the right oblique fissure. No left pleural effusion or pleural based masses. 4 mm nodule in the right upper lobe, image 55, series 5. Patchy consolidation noted in the posterior right upper lobe adjacent to the cavitary lesions mild peribronchovascular opacity in the left lower lobe this most likely subsegmental atelectasis. Upper Abdomen: 2.6 x 2.0 cm right adrenal mass. 2 cm hypoattenuating mass in the posterior segment of the right liver lobe, which is likely a cyst. No acute findings in the upper abdomen. Musculoskeletal: No fractures or acute finding. No osteoblastic or osteolytic lesions. Subcutaneous emphysema seen along the right chest wall, decreased from older prior chest radiographs. Review of the MIP images confirms the above findings. IMPRESSION: 1. No evidence of a pulmonary embolism. 2. Extensive right hemithorax pleural based masses consistent pleural based metastatic disease. 3. Small amount of loculated right pleural fluid. Cystic lesions in the right lower lobe with adjacent patchy consolidation. These latter areas  of abnormality may reflect infection or cavitary neoplastic disease. 4. 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 Aortic Atherosclerosis (ICD10-I70.0). Electronically Signed   By: Lajean Manes M.D.   On: 02/16/2018 19:40   Dg Chest Port 1 View  Result Date: 02/17/2018 CLINICAL DATA:  Tachypnea. Respiratory failure. History of metastatic renal carcinoma. EXAM: PORTABLE CHEST 1 VIEW COMPARISON:  CT 02/16/2018.  Chest x-ray 07/18/2017. FINDINGS: Cardiac pacer noted with lead tips over the right atrium right ventricle. Heart size normal. Diffuse right lung infiltrate. Multiple pleural-based  densities are again noted consistent with pleural base metastatic disease bilateral shoulder replacements. Right chest wall subcutaneous emphysema again noted. IMPRESSION: 1. Diffuse right lung infiltrate suggesting pneumonia. Multiple pleural-based densities are again noted consistent with pleural base metastatic lesions again noted. 2. Right chest wall subcutaneous emphysema again noted. No pneumothorax noted. Electronically Signed   By: Marcello Moores  Register   On: 02/17/2018 09:57     STUDIES:  CT chest reviewed showing multiple pleural-based masses/lesions on the right  CULTURES: Blood cultures negative to date   ANTIBIOTICS: Cefepime 8/22>> Vancomycin 8/22>>  SIGNIFICANT EVENTS: Did decompensate this morning He is scared but maintains does not want to be resuscitated With anxiolytics-he is settling down  LINES/TUBES: Peripheral line  DISCUSSION: Elderly gentleman with metastatic renal cell cancer Extensive disease in his right hemithorax Being treated for pneumonia at present Altered mentation this morning Persistent tachycardia  ASSESSMENT / PLAN:  PULMONARY A: Multilobar pneumonia Metastatic disease Status post recent VATS P:   Continue oxygen supplementation Continue antibiotics  CARDIOVASCULAR A:  Atrial fibrillation P:  Continue on Multitak  RENAL A:   Chronic kidney disease P:   Continue to monitor electrolytes  GASTROINTESTINAL A:   Questionable aspiration P:   Speech evaluation  HEMATOLOGIC A:   Chronic anemia P:  Continue to monitor  INFECTIOUS A:   Multilobar pneumonia P:   Continue antibiotic therapy  RASS goal: 0-1  Discussed with his daughter at bedside Discussed with Dr. Cordelia Poche  Patient does not want to be on a BiPAP because of his claustrophobia  He remains tachycardic and tachypneic Morphine did help He did get a dose of anxiolytic 0.5 of Ativan Will continue to follow  The cause of his altered mentation this  morning is unclear Did not have any chest pains or chest discomfort Did not have any significant coughing episodes, cough was weak however did improve Has no neurological focality Questionable reaction to iron infusion    Pulmonary and [place] Pager: [phone]  02/17/2018, 10:12 AM

## 2018-02-17 NOTE — Care Management Note (Signed)
Case Management Note  Patient Details  Name: Barry Curry MRN: [id number] Date of Birth: 12-25-1940  Subjective/Objective:                    Action/Plan:   Expected Discharge Date:                  Expected Discharge Plan:  [place]  In-House Referral:  Clinical Social Work  Discharge planning Services  CM Consult  Post Acute Care Choice:    Choice offered to:     DME Arranged:    DME Agency:     HH Arranged:    [place] Agency:     Status of Service:  In process, will continue to follow  If discussed at Long Length of Stay Meetings, dates discussed:    Additional Comments:  Leeroy Cha, RN 02/17/2018, 9:46 AM

## 2018-02-17 NOTE — Care Management Note (Signed)
Case Management Note  Patient Details  Name: JACQUELYN ANTONY MRN: [id number] Date of Birth: 12-May-1941  Subjective/Objective:     77 y.o. male with medical history significant for paroxysmal atrial fibrillation, coronary artery disease, chronic kidney disease stage III, and cancer of the left kidney status post nephrectomy in January with metastases to the right pleura status post VATS last month, now presenting to the emergency department for evaluation of progressive shortness of breath and general malaise.  Patient was admitted to the hospital in late July with recurrent right pleural effusion and pleural-based masses.  He underwent VATS and talc pleurodesis.  Pathology was concerning for metastatic disease from the kidney.  He has been following with oncology who would like to start immunotherapy, but fears the patient is to weak and debilitated currently               Action/Plan: From [facility] in [place] Will follow for progression and cm needs. Expected Discharge Date:                  Expected Discharge Plan:     In-House Referral:     Discharge planning Services     Post Acute Care Choice:    Choice offered to:     DME Arranged:    DME Agency:     HH Arranged:    HH Agency:     Status of Service:     If discussed at [employer], dates discussed:    Additional Comments:  Leeroy Cha, RN 02/17/2018, 9:41 AM

## 2018-02-17 NOTE — Progress Notes (Signed)
Pharmacy Antibiotic Note  Barry Curry is a 77 y.o. male with DOE, SOB, cough and malaise admitted on 02/16/2018 with pneumonia.  Pharmacy has been consulted for cefepime and vancomycin dosing.  Plan: Cefepime 1 Gm IV q8h Vancomycin 2 Gm x1 then 1250 mg IV q24h for est AUC = 479 Goal AUC = 400-500 F/u scr/cultures  Height: 6' (182.9 cm) Weight: 189 lb 9.5 oz (86 kg) IBW/kg (Calculated) : 77.6  Temp (24hrs), Avg:98.4 F (36.9 C), Min:97.4 F (36.3 C), Max:99 F (37.2 C)  Recent Labs  Lab 02/16/18 1458 02/16/18 1652 02/16/18 1955  WBC 18.9* 19.5*  --   CREATININE 1.30* 1.32*  --   LATICACIDVEN  --   --  1.39    Estimated Creatinine Clearance: 51.4 mL/min (A) (by C-G formula based on SCr of 1.32 mg/dL (H)).    Allergies  Allergen Reactions  . Penicillins Rash    Has patient had a PCN reaction causing immediate rash, facial/tongue/throat swelling, SOB or lightheadedness with hypotension: No Has patient had a PCN reaction causing severe rash involving mucus membranes or skin necrosis: No Has patient had a PCN reaction that required hospitalization: No Has patient had a PCN reaction occurring within the last 10 years: No If all of the above answers are "NO", then may proceed with Cephalosporin use.     Antimicrobials this admission: 8/21 azactam >> x1 ED 8/22 cefepime >>  8/22 vancomycin >>  Dose adjustments this admission:   Microbiology results:  BCx:   UCx:    Sputum:    MRSA PCR:   Thank you for allowing pharmacy to be a part of this patient's care.  Dorrene German 02/17/2018 2:58 AM

## 2018-02-17 NOTE — Consult Note (Signed)
Referral MD  Reason for Referral: Weakness, metastatic kidney cancer; pneumonia  Chief Complaint  Patient presents with  . Shortness of Breath  : I got weak at the nursing home.  HPI: Barry Curry is well-known to me.  He is a nice 77 year old white male.  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  He had a right pleural effusion.  He underwent a VATS procedure that showed pleural-based mass.  This was biopsied.  There was a poorly differentiated carcinoma consistent with his kidney primary.  He had a nephrectomy back in January.  He was sent to a rehab center.  He really has not gotten better.  He was found to have pneumonia.  He was seen in my office yesterday.  He really was too weak to go home.  He was subsequently admitted.  He had a CT angiogram.  There is no pulmonary embolism.  He has a right pleural-based masses.  He has marked tachycardia.    He is not eating well.    Past Medical History:  Diagnosis Date  . Anemia    history of  . Anxiety   . Aortic stenosis   . Arthritis   . Cancer ([place])    Melanoma back of neck  . Cancer of kidney ([place])   . Cardiomyopathy ([place])   . 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 Complication of anesthesia    violent wake up from anesthesia  . Coronary artery disease   . Depression   . Dyspnea   . Esophageal stricture   . Goals of care, counseling/discussion 11/17/2017  . Heart murmur   . History of atrial fibrillation   . History of claustrophobia   . Hyperlipemia   . Hypertension   . Left kidney mass   . Lower extremity edema   . Mitral stenosis   . PVC (premature ventricular contraction)   . RBBB (right bundle branch block with left anterior fascicular block)   :  Past Surgical History:  Procedure Laterality Date  . CARDIOVERSION    . COLONOSCOPY    . ESOPHAGEAL DILATION    . IR THORACENTESIS ASP PLEURAL SPACE W/IMG GUIDE  01/26/2018  . KNEE ARTHROSCOPY Left   . KNEE ARTHROSCOPY Right   .  PLEURAL BIOPSY Right 02/01/2018   Procedure: PLEURAL BIOPSY;  Surgeon: Gaye Pollack, MD;  Location: [facility];  Service: Thoracic;  Laterality: Right;  . PLEURAL EFFUSION DRAINAGE Right 02/01/2018   Procedure: DRAINAGE OF PLEURAL EFFUSION;  Surgeon: Gaye Pollack, MD;  Location: [facility];  Service: Thoracic;  Laterality: Right;  . ROBOT ASSISTED LAPAROSCOPIC NEPHRECTOMY Left 07/23/2017   Procedure: XI ROBOTIC ASSISTED LAPAROSCOPIC NEPHRECTOMY;  Surgeon: Alexis Frock, MD;  Location: [facility];  Service: Urology;  Laterality: Left;  . SHOULDER ARTHROSCOPY N/A   . TALC PLEURODESIS Right 02/01/2018   Procedure: Pietro Cassis;  Surgeon: Gaye Pollack, MD;  [facility];  Service: Thoracic;  Laterality: Right;  . TOTAL HIP ARTHROPLASTY Left   . TOTAL HIP ARTHROPLASTY Right   . TOTAL KNEE ARTHROPLASTY Left   . TOTAL KNEE ARTHROPLASTY Right   . TOTAL SHOULDER REPLACEMENT Left   . TOTAL SHOULDER REPLACEMENT Right   . UPPER GI ENDOSCOPY    . VIDEO ASSISTED THORACOSCOPY Right 02/01/2018   Procedure: VIDEO ASSISTED THORACOSCOPY;  Surgeon: Gaye Pollack, MD;  Location: [facility] OR;  Service: Thoracic;  Laterality: Right;  :   Current Facility-Administered Medications:  .  0.9 %  sodium chloride infusion, , Intravenous, Continuous, Opyd, Ilene Qua, MD, Last Rate: 90 mL/hr at 02/17/18 0244 .  acetaminophen (TYLENOL) tablet 650 mg, 650 mg, Oral, Q6H PRN **OR** acetaminophen (TYLENOL) suppository 650 mg, 650 mg, Rectal, Q6H PRN, Opyd, Timothy S, MD .  ceFEPIme (MAXIPIME) 1 g in sodium chloride 0.9 % 100 mL IVPB, 1 g, Intravenous, Q8H, Dorrene German, RPH, Stopped at 02/17/18 0404 .  Darbepoetin Alfa (ARANESP) injection 300 mcg, 300 mcg, Subcutaneous, Once, Matheson Vandehei, Rudell Cobb, MD .  diclofenac sodium (VOLTAREN) 1 % transdermal gel 4 g, 4 g, Topical, PRN, Opyd, Ilene Qua, MD .  dronedarone (MULTAQ) tablet 200 mg, 200 mg, Oral, BID WC, Opyd, Ilene Qua, MD .  feeding supplement (ENSURE ENLIVE) (ENSURE ENLIVE)  liquid 237 mL, 237 mL, Oral, TID WC, Opyd, Ilene Qua, MD .  ferumoxytol (FERAHEME) 510 mg in sodium chloride 0.9 % 100 mL IVPB, 510 mg, Intravenous, Once, Carolin Quang R, MD .  Gerhardt's butt cream 1 application, 1 application, Topical, TID PRN, Opyd, Ilene Qua, MD .  heparin injection 5,000 Units, 5,000 Units, Subcutaneous, Q8H, Opyd, Ilene Qua, MD, 5,000 Units at 02/17/18 0550 .  HYDROcodone-acetaminophen (NORCO/VICODIN) 5-325 MG per tablet 1-2 tablet, 1-2 tablet, Oral, Q4H PRN, Opyd, Timothy S, MD .  lipase/protease/amylase (CREON) capsule 72,000 Units, 72,000 Units, Oral, BID AC, Opyd, Timothy S, MD .  MEDLINE mouth rinse, 15 mL, Mouth Rinse, BID, Opyd, Timothy S, MD .  megestrol (MEGACE) 400 MG/10ML suspension 400 mg, 400 mg, Oral, BID, Opyd, Ilene Qua, MD, 400 mg at 02/17/18 0302 .  Melatonin TABS 3 mg, 3 mg, Oral, QHS, Opyd, Ilene Qua, MD, 3 mg at 02/17/18 0301 .  metoprolol tartrate (LOPRESSOR) tablet 12.5 mg, 12.5 mg, Oral, BID, Opyd, Timothy S, MD .  ondansetron (ZOFRAN) tablet 4 mg, 4 mg, Oral, Q6H PRN **OR** ondansetron (ZOFRAN) injection 4 mg, 4 mg, Intravenous, Q6H PRN, Opyd, Timothy S, MD .  pantoprazole (PROTONIX) EC tablet 40 mg, 40 mg, Oral, Daily, Opyd, Timothy S, MD .  pyridOXINE (VITAMIN B-6) tablet 25 mg, 25 mg, Oral, BID, Opyd, Timothy S, MD .  sodium chloride flush (NS) 0.9 % injection 3 mL, 3 mL, Intravenous, Q12H, Opyd, Ilene Qua, MD, 3 mL at 02/17/18 0243 .  tamsulosin (FLOMAX) capsule 0.4 mg, 0.4 mg, Oral, QPC supper, Opyd, Ilene Qua, MD .  Derrill Memo ON 02/18/2018] vancomycin (VANCOCIN) 1,250 mg in sodium chloride 0.9 % 250 mL IVPB, 1,250 mg, Intravenous, Q24H, Green, Elizabeth R, RPH:  . darbepoetin (ARANESP) injection - NON-DIALYSIS  300 mcg Subcutaneous Once  . dronedarone  200 mg Oral BID WC  . feeding supplement (ENSURE ENLIVE)  237 mL Oral TID WC  . heparin  5,000 Units Subcutaneous Q8H  . lipase/protease/amylase  72,000 Units Oral BID AC  . mouth rinse  15 mL  Mouth Rinse BID  . megestrol  400 mg Oral BID  . Melatonin  3 mg Oral QHS  . metoprolol tartrate  12.5 mg Oral BID  . pantoprazole  40 mg Oral Daily  . vitamin B-6  25 mg Oral BID  . sodium chloride flush  3 mL Intravenous Q12H  . tamsulosin  0.4 mg Oral QPC supper  :  Allergies  Allergen Reactions  . Penicillins Rash    Has patient had a PCN reaction causing immediate rash, facial/tongue/throat swelling, SOB or lightheadedness with hypotension: No Has patient had a PCN reaction causing severe rash involving mucus membranes or skin necrosis: No Has patient  had a PCN reaction that required hospitalization: No Has patient had a PCN reaction occurring within the last 10 years: No If all of the above answers are "NO", then may proceed with Cephalosporin use.   :  Family History  Problem Relation Age of Onset  . Breast cancer Mother   . Diabetes Mellitus II Father   . Diabetes Mellitus II Sister   :  Social History   Socioeconomic History  . Marital status: Widowed    Spouse name: Not on file  . Number of children: Not on file  . Years of education: Not on file  . Highest education level: Not on file  Occupational History  . Not on file  Social Needs  . Financial resource strain: Not on file  . Food insecurity:    Worry: Not on file    Inability: Not on file  . Transportation needs:    Medical: Not on file    Non-medical: Not on file  Tobacco Use  . Smoking status: Former Smoker    Packs/day: 0.20    Years: 2.00    Pack years: 0.40    Types: Cigarettes    Last attempt to quit: 07/16/1971    Years since quitting: 46.6  . Smokeless tobacco: Never Used  Substance and Sexual Activity  . Alcohol use: Yes    Comment: daily Scotch  . Drug use: No  . Sexual activity: Not on file  Lifestyle  . Physical activity:    Days per week: Not on file    Minutes per session: Not on file  . Stress: Not on file  Relationships  . Social connections:    Talks on phone: Not on  file    Gets together: Not on file    Attends religious service: Not on file    Active member of club or organization: Not on file    Attends meetings of clubs or organizations: Not on file    Relationship status: Not on file  . Intimate partner violence:    Fear of current or ex partner: Not on file    Emotionally abused: Not on file    Physically abused: Not on file    Forced sexual activity: Not on file  Other Topics Concern  . Not on file  Social History Narrative  . Not on file  :  Pertinent items are noted in HPI.  Exam: Patient Vitals for the past 24 hrs:  BP Temp Temp src Pulse Resp SpO2 Height Weight  02/17/18 0600 (!) 104/55 - - 98 (!) 33 97 % - -  02/17/18 0500 129/70 - - (!) 116 (!) 34 96 % - -  02/17/18 0400 113/74 98.9 F (37.2 C) Oral (!) 109 (!) 35 97 % - -  02/17/18 0300 115/63 - - (!) 115 (!) 39 97 % - -  02/17/18 0200 109/71 - - (!) 114 (!) 37 97 % - -  02/17/18 0100 (!) 100/52 - - (!) 102 (!) 33 98 % - -  02/17/18 0000 (!) 107/52 98.6 F (37 C) Oral (!) 105 (!) 37 97 % - -  02/16/18 2345 - - - (!) 115 (!) 48 97 % - -  02/16/18 2330 - - - (!) 113 (!) 34 97 % - -  02/16/18 2315 - - - (!) 116 (!) 49 97 % - -  02/16/18 2300 (!) 123/56 98.6 F (37 C) Oral (!) 121 (!) 41 96 % 6' (1.829 m) -  02/16/18  2200 - - - (!) 112 (!) 30 96 % - -  02/16/18 2152 114/65 - - - (!) 25 95 % - -  02/16/18 2130 115/76 - - - (!) 26 - - -  02/16/18 2100 110/84 - - (!) 112 (!) 21 96 % - -  02/16/18 2030 116/74 - - (!) 112 (!) 38 97 % - -  02/16/18 2000 112/68 - - (!) 109 (!) 48 97 % - -  02/16/18 1930 110/79 - - (!) 106 (!) 34 95 % - -  02/16/18 1845 (!) 114/59 - - (!) 109 (!) 25 97 % - -  02/16/18 1815 - - - (!) 109 (!) 39 97 % - -  02/16/18 1814 (!) 114/59 - - (!) 114 20 98 % - -  02/16/18 1700 - 99 F (37.2 C) Rectal - - - - -  02/16/18 1633 108/71 98.5 F (36.9 C) Oral (!) 106 (!) 24 98 % - -  02/16/18 1632 - - - - - - 6' (1.829 m) 189 lb 9.5 oz (86 kg)     Recent  Labs    02/16/18 1652 02/17/18 0331  WBC 19.5* 16.5*  HGB 10.0* 9.5*  HCT 30.6* 29.8*  PLT 346 343   Recent Labs    02/16/18 1652 02/17/18 0331  NA 135 138  K 5.4* 5.3*  CL 104 106  CO2 22 25  GLUCOSE 91 81  BUN 30* 26*  CREATININE 1.32* 1.29*  CALCIUM 8.6* 8.6*    Blood smear review: None  Pathology: None     Assessment and Plan: Barry Curry is a 77 year old gentleman.  He has metastatic kidney cancer.  He may have pneumonia.  He is on IV antibiotics.  He we had to work on his anemia.  I am sure he is iron deficient.  He has a low erythropoietin level for his degree of anemia.  I will give him iron and Aranesp.  He actually might benefit from a blood transfusion.  We will have to see what his CBC looks like.  Ultimately, he has the metastatic kidney cancer.  His performance status right now is just not that great.  How much sure if we will ever be able to treat the kidney cancer.  Hopefully, we can get him better in the hospital.  He needs a ton of physical therapy.  I am not sure if they will do this while he is in the ICU.  We will follow along.  I appreciate everybody's help with him.  I know that the nurses in the ICU will do a tremendous job in trying to provide incredibly compassionate care for him.  Barry Haw, MD  Barry Curry 5:10

## 2018-02-17 NOTE — Progress Notes (Signed)
Patient eating breakfast at bedside and became limp and unresponsive.  Patient placed semi fowlers in bed.  Patient maintained spontaneous respirations and heartbeat.  Nurse was at bedside from beginning of episode.  Patient momentarily bagged due to inadequate depth and rate of breathing.  CCM MD at bedside, attending MD notified.  Patient's alert level returned to baseline and placed on non rebreather at 15L.  Respiration rate remains high.  Will continue to monitor.

## 2018-02-17 NOTE — H&P (Signed)
History and Physical    Barry Curry IRC:[id number] DOB: 01/31/1941 DOA: 02/16/2018  PCP: [name], PA-C   Patient coming from: Home -> oncology clinic -> [facility] -> [facility]   Chief Complaint: DOE, SOB, cough, gen malaise   HPI: Barry Curry is a 77 y.o. male with medical history significant for paroxysmal atrial fibrillation, coronary artery disease, chronic kidney disease stage III, and cancer of the left kidney status post nephrectomy in January with metastases to the right pleura status post VATS last month, now presenting to the emergency department for evaluation of progressive shortness of breath and general malaise.  Patient was admitted to the hospital in late July with recurrent right pleural effusion and pleural-based masses.  He underwent VATS and talc pleurodesis.  Pathology was concerning for metastatic disease from the kidney.  He has been following with oncology who would like to start immunotherapy, but fears the patient is to weak and debilitated currently.  He was referred to the ED for further evaluation and management of suspected pneumonia and general debility.  ED Course: Upon arrival to the ED, patient is found to be afebrile, saturating mid 90s on 2 L/min supplemental oxygen, tachypneic in the 40s, tachycardic in the 120s, and with stable blood pressure.  EKG features a sinus tachycardia with rate 108, PVCs, RBBB, LAFB, and poor baseline.  CTA chest is negative for PE, but notable for extensive right hemithorax pleural-based masses consistent with metastatic disease, small amount of loculated right pleural effusion that could reflect cancer or pneumonia, and a 2.6 cm right adrenal mass concerning for metastasis.  Chemistry panel was notable for mild hyperkalemia and creatinine 1.32, consistent with his apparent baseline.  CBC features a leukocytosis to 19,500 and stable normocytic anemia with hemoglobin 10.0.  Urinalysis is unremarkable and lactic acid is reassuringly  normal.  Patient was given a liter of normal saline, blood cultures were collected, and he was treated with Levaquin.  CT surgery was consulted by the ED physician, reviewed the scans, and did not feel there is any role for further surgery at this time.  Patient will be admitted for ongoing evaluation and management of increased dyspnea and cough with progressive general malaise and debility, suspected secondary to HCAP.   Review of Systems:  All other systems reviewed and apart from HPI, are negative.  Past Medical History:  Diagnosis Date  . Anemia    history of  . Anxiety   . Aortic stenosis   . Arthritis   . Cancer ([place])    Melanoma back of neck  . Cancer of kidney ([place])   . Cardiomyopathy ([place])   . 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 Complication of anesthesia    violent wake up from anesthesia  . Coronary artery disease   . Depression   . Dyspnea   . Esophageal stricture   . Goals of care, counseling/discussion 11/17/2017  . Heart murmur   . History of atrial fibrillation   . History of claustrophobia   . Hyperlipemia   . Hypertension   . Left kidney mass   . Lower extremity edema   . Mitral stenosis   . PVC (premature ventricular contraction)   . RBBB (right bundle branch block with left anterior fascicular block)     Past Surgical History:  Procedure Laterality Date  . CARDIOVERSION    . COLONOSCOPY    . ESOPHAGEAL DILATION    . IR THORACENTESIS ASP PLEURAL SPACE W/IMG GUIDE  01/26/2018  . KNEE ARTHROSCOPY Left   . KNEE ARTHROSCOPY Right   . PLEURAL BIOPSY Right 02/01/2018   Procedure: PLEURAL BIOPSY;  Surgeon: Gaye Pollack, MD;  Location: [place];  Service: Thoracic;  Laterality: Right;  . PLEURAL EFFUSION DRAINAGE Right 02/01/2018   Procedure: DRAINAGE OF PLEURAL EFFUSION;  Surgeon: Gaye Pollack, MD;  Location: [place];  Service: Thoracic;  Laterality: Right;  . ROBOT ASSISTED LAPAROSCOPIC NEPHRECTOMY Left 07/23/2017   Procedure: XI ROBOTIC  ASSISTED LAPAROSCOPIC NEPHRECTOMY;  Surgeon: Alexis Frock, MD;  Location: [facility];  Service: Urology;  Laterality: Left;  . SHOULDER ARTHROSCOPY N/A   . TALC PLEURODESIS Right 02/01/2018   Procedure: Pietro Cassis;  Surgeon: Gaye Pollack, MD;  Location: [facility] OR;  Service: Thoracic;  Laterality: Right;  . TOTAL HIP ARTHROPLASTY Left   . TOTAL HIP ARTHROPLASTY Right   . TOTAL KNEE ARTHROPLASTY Left   . TOTAL KNEE ARTHROPLASTY Right   . TOTAL SHOULDER REPLACEMENT Left   . TOTAL SHOULDER REPLACEMENT Right   . UPPER GI ENDOSCOPY    . VIDEO ASSISTED THORACOSCOPY Right 02/01/2018   Procedure: VIDEO ASSISTED THORACOSCOPY;  Surgeon: Gaye Pollack, MD;  Location: [facility];  Service: Thoracic;  Laterality: Right;     reports that he quit smoking about 46 years ago. His smoking use included cigarettes. He has a 0.40 pack-year smoking history. He has never used smokeless tobacco. He reports that he drinks alcohol. He reports that he does not use drugs.  Allergies  Allergen Reactions  . Penicillins Rash    Has patient had a PCN reaction causing immediate rash, facial/tongue/throat swelling, SOB or lightheadedness with hypotension: No Has patient had a PCN reaction causing severe rash involving mucus membranes or skin necrosis: No Has patient had a PCN reaction that required hospitalization: No Has patient had a PCN reaction occurring within the last 10 years: No If all of the above answers are "NO", then may proceed with Cephalosporin use.     Family History  Problem Relation Age of Onset  . Breast cancer Mother   . Diabetes Mellitus II Father   . Diabetes Mellitus II Sister      Prior to Admission medications   Medication Sig Start Date End Date Taking? Authorizing Provider  ertapenem (INVANZ) 1 g injection Inject 1 g into the muscle daily. 02/15/18 02/17/18 Yes [provider]  feeding supplement (ENSURE CLINICAL STRENGTH) LIQD Take 237 mLs by mouth 3 (three) times daily with  meals.   Yes [provider]  ferrous sulfate 325 (65 FE) MG EC tablet Take 325 mg by mouth daily with breakfast.   Yes [provider]  Hydrocortisone (GERHARDT'S BUTT CREAM) CREA Apply 1 application topically 3 (three) times daily as needed for irritation.   Yes [provider]  liver oil-zinc oxide (DESITIN) 40 % ointment Apply 1 application topically 3 (three) times daily as needed for irritation.   Yes [provider]  acetaminophen (TYLENOL) 500 MG tablet Take 500 mg by mouth daily.     [provider]  diclofenac sodium (VOLTAREN) 1 % GEL Apply 4 g topically as needed (joint pain).  05/26/10   [provider]  dronedarone (MULTAQ) 400 MG tablet Take 0.5 tablets (200 mg total) by mouth 2 (two) times daily with a meal. 02/07/18   Lars Pinks M, PA-C  fluticasone (FLONASE) 50 MCG/ACT nasal spray Place 2 sprays into both nostrils at bedtime.    [provider]  levofloxacin (  LEVAQUIN) 500 MG tablet Take 500 mg by mouth daily. 02/15/18 02/24/18  [provider]  lipase/protease/amylase (CREON) 36000 UNITS CPEP capsule Take 2 capsules (72,000 Units total) by mouth 2 (two) times daily before lunch and supper. 01/12/18   Volanda Napoleon, MD  loperamide (IMODIUM) 2 MG capsule Take 2 mg by mouth daily as needed for diarrhea or loose stools.     [provider]  megestrol (MEGACE) 400 MG/10ML suspension Take 10 mLs (400 mg total) by mouth 2 (two) times daily. 02/07/18   Elgie Collard, PA-C  Melatonin 3 MG TABS Take 3 mg by mouth at bedtime.    [provider]  metoprolol tartrate (LOPRESSOR) 25 MG tablet Take 0.5 tablets (12.5 mg total) by mouth 2 (two) times daily. 02/07/18 03/09/18  Elgie Collard, PA-C  pantoprazole (PROTONIX) 40 MG tablet Take 40 mg by mouth daily.    [provider]  tamsulosin (FLOMAX) 0.4 MG CAPS capsule Take 0.4 mg by mouth daily after supper.    [provider]    traMADol (ULTRAM) 50 MG tablet Take 1 tablet (50 mg total) by mouth every 6 (six) hours as needed (mild pain). Patient not taking: Reported on 02/17/2018 02/07/18   Nani Skillern, PA-C  vitamin B-6 (PYRIDOXINE) 25 MG tablet Take 25 mg by mouth 2 (two) times daily.    [provider]    Physical Exam: Vitals:   02/16/18 2315 02/16/18 2330 02/16/18 2345 02/17/18 0000  BP:    (!) 107/52  Pulse: (!) 116 (!) 113 (!) 115 (!) 105  Resp: (!) 49 (!) 34 (!) 48 (!) 37  Temp:    98.6 F (37 C)  TempSrc:    Oral  SpO2: 97% 97% 97% 97%  Weight:      Height:          Constitutional: tachypneic in 40's, dyspneic with speech, no pallor  Eyes: PERTLA, lids and conjunctivae normal ENMT: Mucous membranes are moist. Posterior pharynx clear of any exudate or lesions.   Neck: normal, supple, no masses, no thyromegaly Respiratory: Diminished on right, scattered rhonchi on right. Increased WOB, no pallor or cyanosis.  Cardiovascular: Rate ~120 and regular. No extremity edema.   Abdomen: No distension, no tenderness, soft. Bowel sounds active.  Musculoskeletal: no clubbing / cyanosis. No joint deformity upper and lower extremities.   Skin: no significant rashes, lesions, ulcers. Warm, dry, well-perfused. Neurologic: CN 2-12 grossly intact. Sensation intact. Moving all extremities.  Psychiatric: Alert and oriented x 3. Pleasant and cooperative.     Labs on Admission: I have personally reviewed following labs and imaging studies  CBC: Recent Labs  Lab 02/16/18 1458 02/16/18 1652  WBC 18.9* 19.5*  NEUTROABS 15.5* 16.6*  HGB 10.5* 10.0*  HCT 32.8* 30.6*  MCV 83.0 82.0  PLT 366 174   Basic Metabolic Panel: Recent Labs  Lab 02/16/18 1458 02/16/18 1652  NA 137 135  K 5.2* 5.4*  CL 103 104  CO2 25 22  GLUCOSE 92 91  BUN 27* 30*  CREATININE 1.30* 1.32*  CALCIUM 9.1 8.6*   GFR: Estimated Creatinine Clearance: 51.4 mL/min (A) (by C-G formula based on SCr of 1.32 mg/dL  (H)). Liver Function Tests: Recent Labs  Lab 02/16/18 1458 02/16/18 1652  AST 32 35  ALT 31 28  ALKPHOS 92* 84  BILITOT 0.6 0.7  PROT 6.6 6.5  ALBUMIN 2.7* 2.6*   No results for input(s): LIPASE, AMYLASE in the last 168 hours. No  results for input(s): AMMONIA in the last 168 hours. Coagulation Profile: No results for input(s): INR, PROTIME in the last 168 hours. Cardiac Enzymes: No results for input(s): CKTOTAL, CKMB, CKMBINDEX, TROPONINI in the last 168 hours. BNP (last 3 results) No results for input(s): PROBNP in the last 8760 hours. HbA1C: No results for input(s): HGBA1C in the last 72 hours. CBG: No results for input(s): GLUCAP in the last 168 hours. Lipid Profile: No results for input(s): CHOL, HDL, LDLCALC, TRIG, CHOLHDL, LDLDIRECT in the last 72 hours. Thyroid Function Tests: No results for input(s): TSH, T4TOTAL, FREET4, T3FREE, THYROIDAB in the last 72 hours. Anemia Panel: No results for input(s): VITAMINB12, FOLATE, FERRITIN, TIBC, IRON, RETICCTPCT in the last 72 hours. Urine analysis:    Component Value Date/Time   COLORURINE YELLOW 02/16/2018 2018   APPEARANCEUR CLEAR 02/16/2018 2018   LABSPEC 1.010 02/16/2018 2018   PHURINE 6.5 02/16/2018 2018   GLUCOSEU NEGATIVE 02/16/2018 2018   HGBUR NEGATIVE 02/16/2018 2018   [place] NEGATIVE 02/16/2018 2018   KETONESUR NEGATIVE 02/16/2018 2018   PROTEINUR NEGATIVE 02/16/2018 2018   UROBILINOGEN 1.0 03/21/2010 1348   NITRITE NEGATIVE 02/16/2018 2018   LEUKOCYTESUR NEGATIVE 02/16/2018 2018   Sepsis Labs: @LABRCNTIP (procalcitonin:4,lacticidven:4) )No results found for this or any previous visit (from the past 240 hour(s)).   Radiological Exams on Admission: Dg Chest 2 View  Result Date: 02/16/2018 CLINICAL DATA:  Shortness of breath. History of melanoma and renal cell carcinoma EXAM: CHEST - 2 VIEW COMPARISON:  February 07, 2018 FINDINGS: There is loculated pleural effusion on the right inferolaterally. There is  also fluid tracking along the right major fissure. The left lung is clear. Heart size and pulmonary vascularity are normal. There is less subcutaneous air on the right compared to most recent study. Pacemaker leads are attached to the right atrium and right ventricle. There is aortic atherosclerosis. No evident adenopathy. 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 Central catheter has been removed.  No evident pneumothorax. IMPRESSION: Persistent loculated effusion on the right, most notably along the inferolateral aspect. Underlying airspace opacity could be obscured by this effusion. Left lung clear. Stable cardiac silhouette. There is aortic atherosclerosis. Pacemaker leads attached to right atrium and right ventricle. Aortic Atherosclerosis (ICD10-I70.0). Electronically Signed   By: Lowella Grip III M.D.   On: 02/16/2018 18:01   Ct Angio Chest Pe W And/or Wo Contrast  Result Date: 02/16/2018 CLINICAL DATA:  Cough. Pneumonia. Recurrent pleural effusions of the right lung. History of metastatic renal carcinoma. Left nephrectomy performed in January 2019. EXAM: CT ANGIOGRAPHY CHEST WITH CONTRAST TECHNIQUE: Multidetector CT imaging of the chest was performed using the standard protocol during bolus administration of intravenous contrast. Multiplanar CT image reconstructions and MIPs were obtained to evaluate the vascular anatomy. CONTRAST:  131mL ISOVUE-370 IOPAMIDOL (ISOVUE-370) INJECTION 76% COMPARISON:  Current chest radiograph and prior chest radiographs. FINDINGS: Cardiovascular: There is satisfactory opacification of the pulmonary arteries to the segmental level. There is no evidence of a pulmonary embolism. Heart is normal size. No pericardial effusion. Mild left coronary artery calcifications. Great vessels are normal in caliber. Mild aortic atherosclerosis. 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 Mediastinum/Nodes: Neck base assessment limited  by artifact from bilateral shoulder prosthesis. No visualized neck base or axillary mass or adenopathy. There is pleural based opacity that encroaches upon the right aspect of the mediastinum. There is no convincing mediastinal based mass. No discrete enlarged lymph node. Trachea is patent. Esophagus is mildly  distended. Lungs/Pleura: With a small amount of associated loculated pleural based fluid. There are cavitary lesions in the right lower lobe with air-fluid levels. Some fluid tracks along the right oblique fissure. No left pleural effusion or pleural based masses. 4 mm nodule in the right upper lobe, image 55, series 5. Patchy consolidation noted in the posterior right upper lobe adjacent to the cavitary lesions mild peribronchovascular opacity in the left lower lobe this most likely subsegmental atelectasis. Upper Abdomen: 2.6 x 2.0 cm right adrenal mass. 2 cm hypoattenuating mass in the posterior segment of the right liver lobe, which is likely a cyst. No acute findings in the upper abdomen. Musculoskeletal: No fractures or acute finding. No osteoblastic or osteolytic lesions. Subcutaneous emphysema seen along the right chest wall, decreased from older prior chest radiographs. Review of the MIP images confirms the above findings. IMPRESSION: 1. No evidence of a pulmonary embolism. 2. Extensive right hemithorax pleural based masses consistent pleural based metastatic disease. 3. Small amount of loculated right pleural fluid. Cystic lesions in the right lower lobe with adjacent patchy consolidation. These latter areas of abnormality may reflect infection or cavitary neoplastic disease. 4. 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 Aortic Atherosclerosis (ICD10-I70.0). Electronically Signed   By: Lajean Manes M.D.   On: 02/16/2018 19:40    EKG: Independently reviewed. Sinus tachycardia (rate 108), PVC's, RBBB, LAFB, poor baseline.   Assessment/Plan   1. HCAP  - Presents with  increasing SOB, cough, and progressive malaise and gen weakness  - Chest imaging concerning for pneumonia involving RLL  - He is afebrile and lactate is normal  - Blood cultures collected in ED, will add sputum culture and check strep pneumo and legionella antigens; pleural fluid and blood cultures negative last month   - Continue empiric treatment with vancomycin and cefepime while following cultures and clinical course    2. Metastatic renal cell carcinoma  - He is status-post left nephrectomy in January 2019, admitted last month with recurrent right pleural effusion and right pleural-based masses concerning for metastatic disease - He follows with oncology and is being considered for immunotherapy, but oncologist would like to get his strength up prior to initiating  - Will add oncologist to treatment team, consult with dietary and PT, continue supportive care    3. CAD  - No anginal complaints  - Continue beta-blocker as tolerated   4. Paroxysmal atrial fibrillation  - In a sinus rhythm on admission  - CHADS-VASc is at least 36 (age x2, CAD) but he has not been on anticoagulation d/t bleeding risk and recent hemorrhagic pleural effusion    5. CKD stage III; hyperkalemia  - SCr appears to be stable at 1.32 on admission  - There is mild hyperkalemia, will hydrate gently and repeat chem panel in am     DVT prophylaxis: sq heparin   Code Status: DNR  Family Communication: Daughter updated at bedside Consults called: None Admission status: Inpatient     Vianne Bulls, MD [facility] Pager [phone]  If 7PM-7AM, please contact night-coverage [email or web address] Password [email or web address]  02/17/2018, 1:13 AM

## 2018-02-17 NOTE — Progress Notes (Signed)
Initial Nutrition Assessment  DOCUMENTATION CODES:   Severe malnutrition in context of chronic illness  INTERVENTION:  - Continue Ensure Enlive TID, each supplement provides 350 kcal and 20 grams of protein. - Continue Megace BID. - Will monitor for POC/GOC.  NUTRITION DIAGNOSIS:   Severe Malnutrition related to chronic illness, cancer and cancer related treatments as evidenced by percent weight loss, energy intake < or equal to 50% for > or equal to 1 month.  GOAL:   Other (Comment)(nutrition goals to be in line with POC/GOC.)  MONITOR:   PO intake, Weight trends, Labs  REASON FOR ASSESSMENT:   Malnutrition Screening Tool, Consult Assessment of nutrition requirement/status  ASSESSMENT:   77 y.o. 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 He presented to the ED on 8/21 for evaluation of progressive SOB and general malaise.  BMI indicates overweight status, appropriate for age. No intakes documented since admission. Spoke with patient's daughter who is at bedside. Patient was seen by an RD during hospitalization ~2 weeks ago. Daughter reports that patient was prescribed Megace ~1 month ago and that it initially began to improve appetite but shortly after appetite returned to being poor. She states that since shortly after Megace initiation patient has been experiencing early satiety, lack of appetite with lack of interest/desire in eating, and bland taste with most items. He does best with sweet foods and the past few weeks has mainly been consuming 1-2 bottles of Ensure/day and eating items such as peach pie, cobbler, or consuming juice. He was would typically eat only a few bites at each meal time.   Daughter states that earlier this AM patient ate a bowl of Frosted  Flakes and drank some orange juice. He was preparing to drink Ensure when PO medications were provided. Patient took the first pill and then stopped breathing and eyes rolled back, per daughter's recollection. She states MD wanted to intubate but patient is DNR/DNI and patient refused BiPAP d/t claustrophobia. He is currently on NRB. Daughter reports patient was given morphine and Ativan after the incident.   Did not feel NFPE was appropriate at this time. Per chart review, current weight is 189 lb and weight on 5/22 was 206 lb. This indicates 17 lb weight loss (8% body weight) in the past 3 months.   Medications reviewed; 510 mg Feraheme x1 today, 72000 units oral Creon before lunch and dinner, 400 mg Megace BID, 40 mg oral Protonix/day, 25 mg oral vitamin B6 BID. Labs reviewed; K: 5.3 mmol/L, BUN: 26 mg/dL, creatinine: 1.29 mg/dL, Ca: 8.6 mg/dL, GFR: 52 mL/min.  IVF; NS @ 90 mL/hr from 6759-1638 today.       NUTRITION - FOCUSED PHYSICAL EXAM:  Unable to complete; will do so at follow-up (if appropriate at that time).  Diet Order:   Diet Order            Diet NPO time specified  Diet effective now              EDUCATION NEEDS:   No education needs have been identified at this time  Skin:  Skin Assessment: Skin Integrity Issues: Skin Integrity Issues:: Stage II Stage II: L buttocks  Last BM:  8/21  Height:   Ht Readings from Last 1 Encounters:  02/16/18 6' (1.829 m)    Weight:  Wt Readings from Last 1 Encounters:  02/16/18 86 kg    Ideal Body Weight:  80.91 kg  BMI:  Body mass index is 25.71 kg/m.  Estimated Nutritional Needs:   Kcal:  3005-1102  Protein:  110-120 grams  Fluid:  >/= 2 L/day     Jarome Matin, MS, RD, LDN, [facility] Inpatient Clinical Dietitian Pager # [phone] After hours/weekend pager # [phone]

## 2018-02-17 NOTE — Progress Notes (Signed)
   02/17/18 0919  Clinical Encounter Type  Visited With Family  Visit Type Initial;Critical Care  Referral From Nurse  Consult/Referral To Chaplain  Stress Factors  Family Stress Factors Loss;Health changes   Responded to a page by the Unit to come support the daughter of the patient. (Patient goes by [place]).  Patient had some sort of episode and when I arrived the daughter was in the hall and said things had called down some.  The patient's wife (mother of their children) died in November 27, 2016 and shortly after that [place]'s cancer ws discovered and he has been in and out of the hospital with a new mass being discovered, according to the daughter.  She stated that her dad is tired and worried he will be a burden to his daughters.  She stated he does not have much of a faith (other daughter and her husband are pastors in [employer]).  I asked about going in to be with her dad and she really felt like now was not a good time for him.  Daughter was going to call her sister.  I assured her we are here for them and to let us know how we can support.  Will follow and support as needed. Chaplain Katherene Ponto

## 2018-02-17 NOTE — Progress Notes (Signed)
PROGRESS NOTE    Barry Curry  YBW:[id number] DOB: March 02, 1941 DOA: 02/16/2018 PCP: [name], PA-C   Brief Narrative: Barry Curry is a 77 y.o. 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 Presented secondary to shortness of breath and found to have a RLL pneumonia.   Assessment & Plan:   Principal Problem:   Healthcare-associated pneumonia Active Problems:   PAF (paroxysmal atrial fibrillation) (HCC)   Clear cell carcinoma of kidney, left (HCC)   CAD (coronary artery disease)   CKD (chronic kidney disease), stage III (HCC)   Pressure injury of skin   Right lower lobe pneumonia HCAP treatment. Afebrile currently. On antibiotics -Continue Vancomycin/cefepime -Sputum culture, urine strep/legionella antigen -Blood cultures  Acute respiratory failure with hypoxia Appears to be secondary to an aspiration episode while drinking some juice this morning. Patient was also receiving an IV iron infusion. Associated unresponsive episode lasting less than 5 minutes per discussion with CCM. -NPO -SLP eval -Non-rebreather. Patient declines BiPAP   Paroxysmal atrial fibrillation Patient currently in sinus rhythm. On dronedarone and metoprolol as an outpatient.  -Continue metoprolol and hold dronedarone while NPO -Switch to metoprolol IV  CAD -Continue metoprolol (IV form while NPO)  CKD stage III Stable  Metastatic renal cell carcinoma Seen by oncologist today. Patient with significant chest burden.   DVT prophylaxis: Heparin subq Code Status:   Code Status: DNR Family Communication: Daughter at bedside Disposition Plan: Discharge pending medical improvement. Patient tenuous.   Consultants:   CCM  Procedures:    None  Antimicrobials:  Levaquin  Vancomycin  Cefepime    Subjective: Shortness of breath. No chest pain.  Objective: Vitals:   02/17/18 0400 02/17/18 0500 02/17/18 0600 02/17/18 0800  BP: 113/74 129/70 (!) 104/55   Pulse: (!) 109 (!) 116 98   Resp: (!) 35 (!) 34 (!) 33   Temp: 98.9 F (37.2 C)   97.9 F (36.6 C)  TempSrc: Oral   Oral  SpO2: 97% 96% 97%   Weight:      Height:        Intake/Output Summary (Last 24 hours) at 02/17/2018 0944 Last data filed at 02/17/2018 0500 Gross per 24 hour  Intake 914.21 ml  Output 325 ml  Net 589.21 ml   Filed Weights   02/16/18 1632  Weight: 86 kg    Examination:  General exam: In distress.  Respiratory system: Diminished bilaterally, no rales or wheezing. Significantly increased work of breathing with tachypnea. Cardiovascular system: S1 & S2 heard, Tachycardia, regular rhythm. No murmurs, rubs, gallops or clicks. Gastrointestinal system: Abdomen is nondistended, soft and nontender. Normal bowel sounds heard. Central nervous system: Alert and oriented. No focal neurological deficits. Extremities: No edema. No calf tenderness Skin: No cyanosis. No rashes Psychiatry: Judgement and insight appear normal. Mood & affect appropriate.     Data Reviewed: I have personally reviewed following labs and imaging studies  CBC: Recent Labs  Lab 02/16/18 1458 02/16/18 1652 02/17/18 0331  WBC 18.9* 19.5* 16.5*  NEUTROABS 15.5* 16.6* 13.6*  HGB 10.5* 10.0* 9.5*  HCT 32.8* 30.6* 29.8*  MCV 83.0 82.0 83.5  PLT 366 346 768   Basic Metabolic Panel: Recent Labs  Lab 02/16/18 1458 02/16/18 1652 02/17/18 0331  NA 137 135 138  K 5.2* 5.4* 5.3*  CL 103  104 106  CO2 25 22 25   GLUCOSE 92 91 81  BUN 27* 30* 26*  CREATININE 1.30* 1.32* 1.29*  CALCIUM 9.1 8.6* 8.6*   GFR: Estimated Creatinine Clearance: 52.6 mL/min (A) (by C-G formula based on SCr of 1.29 mg/dL (H)). Liver Function Tests: Recent Labs  Lab 02/16/18 1458  02/16/18 1652  AST 32 35  ALT 31 28  ALKPHOS 92* 84  BILITOT 0.6 0.7  PROT 6.6 6.5  ALBUMIN 2.7* 2.6*   No results for input(s): LIPASE, AMYLASE in the last 168 hours. No results for input(s): AMMONIA in the last 168 hours. Coagulation Profile: No results for input(s): INR, PROTIME in the last 168 hours. Cardiac Enzymes: No results for input(s): CKTOTAL, CKMB, CKMBINDEX, TROPONINI in the last 168 hours. BNP (last 3 results) No results for input(s): PROBNP in the last 8760 hours. HbA1C: No results for input(s): HGBA1C in the last 72 hours. CBG: Recent Labs  Lab 02/17/18 0925  GLUCAP 84   Lipid Profile: No results for input(s): CHOL, HDL, LDLCALC, TRIG, CHOLHDL, LDLDIRECT in the last 72 hours. Thyroid Function Tests: No results for input(s): TSH, T4TOTAL, FREET4, T3FREE, THYROIDAB in the last 72 hours. Anemia Panel: No results for input(s): VITAMINB12, FOLATE, FERRITIN, TIBC, IRON, RETICCTPCT in the last 72 hours. Sepsis Labs: Recent Labs  Lab 02/16/18 1701 02/16/18 1955  LATICACIDVEN 2.03* 1.39    Recent Results (from the past 240 hour(s))  MRSA PCR Screening     Status: None   Collection Time: 02/16/18 11:03 PM  Result Value Ref Range Status   MRSA by PCR NEGATIVE NEGATIVE Final    Comment:        The GeneXpert MRSA Assay (FDA approved for NASAL specimens only), is one component of a comprehensive MRSA colonization surveillance program. It is not intended to diagnose MRSA infection nor to guide or monitor treatment for MRSA infections. Performed at [facility], [place] [address], [place], [place] [address]          Radiology Studies: Dg Chest 2 View  Result Date: 02/16/2018 CLINICAL DATA:  Shortness of breath. History of melanoma and renal cell carcinoma EXAM: CHEST - 2 VIEW COMPARISON:  February 07, 2018 FINDINGS: There is loculated pleural effusion on the right inferolaterally. There is also fluid tracking along the right major  fissure. The left lung is clear. Heart size and pulmonary vascularity are normal. There is less subcutaneous air on the right compared to most recent study. Pacemaker leads are attached to the right atrium and right ventricle. There is aortic atherosclerosis. No evident adenopathy. 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 Central catheter has been removed.  No evident pneumothorax. IMPRESSION: Persistent loculated effusion on the right, most notably along the inferolateral aspect. Underlying airspace opacity could be obscured by this effusion. Left lung clear. Stable cardiac silhouette. There is aortic atherosclerosis. Pacemaker leads attached to right atrium and right ventricle. Aortic Atherosclerosis (ICD10-I70.0). Electronically Signed   By: Lowella Grip III M.D.   On: 02/16/2018 18:01   Ct Angio Chest Pe W And/or Wo Contrast  Result Date: 02/16/2018 CLINICAL DATA:  Cough. Pneumonia. Recurrent pleural effusions of the right lung. History of metastatic renal carcinoma. Left nephrectomy performed in January 2019. EXAM: CT ANGIOGRAPHY CHEST WITH CONTRAST TECHNIQUE: Multidetector CT imaging of the chest was performed using the standard protocol during bolus administration of intravenous contrast. Multiplanar CT image reconstructions and MIPs were obtained to evaluate the vascular anatomy. CONTRAST:  18mL ISOVUE-370 IOPAMIDOL (ISOVUE-370) INJECTION 76%  COMPARISON:  Current chest radiograph and prior chest radiographs. FINDINGS: Cardiovascular: There is satisfactory opacification of the pulmonary arteries to the segmental level. There is no evidence of a pulmonary embolism. Heart is normal size. No pericardial effusion. Mild left coronary artery calcifications. Great vessels are normal in caliber. Mild aortic atherosclerosis. 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 Mediastinum/Nodes: Neck base assessment limited by artifact from bilateral shoulder  prosthesis. No visualized neck base or axillary mass or adenopathy. There is pleural based opacity that encroaches upon the right aspect of the mediastinum. There is no convincing mediastinal based mass. No discrete enlarged lymph node. Trachea is patent. Esophagus is mildly distended. Lungs/Pleura: With a small amount of associated loculated pleural based fluid. There are cavitary lesions in the right lower lobe with air-fluid levels. Some fluid tracks along the right oblique fissure. No left pleural effusion or pleural based masses. 4 mm nodule in the right upper lobe, image 55, series 5. Patchy consolidation noted in the posterior right upper lobe adjacent to the cavitary lesions mild peribronchovascular opacity in the left lower lobe this most likely subsegmental atelectasis. Upper Abdomen: 2.6 x 2.0 cm right adrenal mass. 2 cm hypoattenuating mass in the posterior segment of the right liver lobe, which is likely a cyst. No acute findings in the upper abdomen. Musculoskeletal: No fractures or acute finding. No osteoblastic or osteolytic lesions. Subcutaneous emphysema seen along the right chest wall, decreased from older prior chest radiographs. Review of the MIP images confirms the above findings. IMPRESSION: 1. No evidence of a pulmonary embolism. 2. Extensive right hemithorax pleural based masses consistent pleural based metastatic disease. 3. Small amount of loculated right pleural fluid. Cystic lesions in the right lower lobe with adjacent patchy consolidation. These latter areas of abnormality may reflect infection or cavitary neoplastic disease. 4. 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 Aortic Atherosclerosis (ICD10-I70.0). Electronically Signed   By: Lajean Manes M.D.   On: 02/16/2018 19:40        Scheduled Meds: . LORazepam      . darbepoetin (ARANESP) injection - NON-DIALYSIS  300 mcg Subcutaneous Once  . dronedarone  200 mg Oral BID WC  . feeding supplement (ENSURE  ENLIVE)  237 mL Oral TID WC  . heparin  5,000 Units Subcutaneous Q8H  . lipase/protease/amylase  72,000 Units Oral BID AC  . mouth rinse  15 mL Mouth Rinse BID  . megestrol  400 mg Oral BID  . Melatonin  3 mg Oral QHS  . metoprolol tartrate  12.5 mg Oral BID  . morphine      . morphine      . pantoprazole  40 mg Oral Daily  . vitamin B-6  25 mg Oral BID  . sodium chloride flush  3 mL Intravenous Q12H  . tamsulosin  0.4 mg Oral QPC supper   Continuous Infusions: . sodium chloride 90 mL/hr at 02/17/18 0244  . ceFEPime (MAXIPIME) IV Stopped (02/17/18 0404)  . [START ON 02/18/2018] vancomycin       LOS: 1 day     Cordelia Poche, MD [facility] 02/17/2018, 9:44 AM Pager: [phone]  If 7PM-7AM, please contact night-coverage [email or web address] 02/17/2018, 9:44 AM

## 2018-02-18 DIAGNOSIS — E43 Unspecified severe protein-calorie malnutrition: Secondary | ICD-10-CM

## 2018-02-18 LAB — CBC WITH DIFFERENTIAL/PLATELET
BASOS ABS: 0 10*3/uL (ref 0.0–0.1)
Basophils Relative: 0 %
Eosinophils Absolute: 0.1 10*3/uL (ref 0.0–0.7)
Eosinophils Relative: 0 %
HEMATOCRIT: 30.5 % — AB (ref 39.0–52.0)
HEMOGLOBIN: 9.5 g/dL — AB (ref 13.0–17.0)
LYMPHS PCT: 10 %
Lymphs Abs: 1.5 10*3/uL (ref 0.7–4.0)
MCH: 26.7 pg (ref 26.0–34.0)
MCHC: 31.1 g/dL (ref 30.0–36.0)
MCV: 85.7 fL (ref 78.0–100.0)
MONO ABS: 0.9 10*3/uL (ref 0.1–1.0)
MONOS PCT: 6 %
NEUTROS ABS: 12.8 10*3/uL — AB (ref 1.7–7.7)
NEUTROS PCT: 84 %
Platelets: 309 10*3/uL (ref 150–400)
RBC: 3.56 MIL/uL — ABNORMAL LOW (ref 4.22–5.81)
RDW: 18.5 % — ABNORMAL HIGH (ref 11.5–15.5)
WBC: 15.2 10*3/uL — ABNORMAL HIGH (ref 4.0–10.5)

## 2018-02-18 LAB — COMPREHENSIVE METABOLIC PANEL
ALBUMIN: 2.3 g/dL — AB (ref 3.5–5.0)
ALK PHOS: 68 U/L (ref 38–126)
ALT: 19 U/L (ref 0–44)
AST: 19 U/L (ref 15–41)
Anion gap: 11 (ref 5–15)
BILIRUBIN TOTAL: 0.8 mg/dL (ref 0.3–1.2)
BUN: 27 mg/dL — AB (ref 8–23)
CALCIUM: 8.6 mg/dL — AB (ref 8.9–10.3)
CO2: 20 mmol/L — ABNORMAL LOW (ref 22–32)
Chloride: 109 mmol/L (ref 98–111)
Creatinine, Ser: 1.17 mg/dL (ref 0.61–1.24)
GFR calc Af Amer: >60 mL/min (ref >60)
GFR, EST NON AFRICAN AMERICAN: 58 mL/min — AB (ref >60)
GLUCOSE: 59 mg/dL — AB (ref 70–99)
Potassium: 5.2 mmol/L — ABNORMAL HIGH (ref 3.5–5.1)
Sodium: 140 mmol/L (ref 135–145)
TOTAL PROTEIN: 5.7 g/dL — AB (ref 6.5–8.1)

## 2018-02-18 LAB — PREALBUMIN: PREALBUMIN: 7.6 mg/dL — AB (ref 18–38)

## 2018-02-18 LAB — STREP PNEUMONIAE URINARY ANTIGEN: Strep Pneumo Urinary Antigen: NEGATIVE

## 2018-02-18 MED ORDER — METOPROLOL TARTRATE 12.5 MG HALF TABLET
12.5000 mg | ORAL_TABLET | Freq: Two times a day (BID) | ORAL | Status: DC
  Administered 2018-02-18 – 2018-02-21 (×6): 12.5 mg via ORAL
  Filled 2018-02-18 (×6): qty 1

## 2018-02-18 MED ORDER — SODIUM CHLORIDE 0.9 % IV SOLN: INTRAVENOUS | Status: DC

## 2018-02-18 NOTE — Progress Notes (Signed)
Barry Curry  ZMO:[id number] DOB: 11-Jul-1940 DOA: 02/16/2018 PCP: [name], PA-C    Reason for Consult / Chief Complaint:  Altered mentation Shortness of breath  Consulting MD:  Cordelia Poche  HPI/Brief Narrative   Patient had been admitted for healthcare associated pneumonia He does have a history of metastatic renal cancer Recent VATS did reveal pleural-based mass consistent with adenocarcinoma-consistent with metastasis from his primary kidney cancer Persistence of the shortness of breath  Assessment & Plan:  1.  Hypoxemic respiratory failure -Improving, approaching baseline  2.  Altered mental status -Improved  3.  Abnormal CT scan of the chest showing pleural-based masses, loculated effusion -Related to his underlying renal cancer  4.  Multilobar pneumonia -Continues on antibiotics at present -Cefepime 8/22>> -Vancomycin 8/22>> Suggest to complete 7 days of antibiotics  5.  Metastatic renal cancer  6.  Paroxysmal atrial fibrillation  7.  Coronary artery disease  8.  Chronic kidney disease    Best practice/Goals of care/disposition.   DVT prophylaxis: On subcu heparin GI prophylaxis: He is taking orally Diet: As tolerated Mobility: Able to ambulate to bedside chair Code Status: DNR Family Communication: Followed up with family members  Disposition / Summary of Today's Plan 02/18/18   Continue antibiotic therapy Continue oxygen segmentation  Will sign off and follow-up as needed  Significant Diagnostic Tests: CT scan of the chest reviewed by myself showing multiple pleural-based lesions Multifocal infiltrates  Micro Data: Blood cultures 8/21-negative  Antimicrobials:  Vancomycin 8/22 Cefepime 8/22  Subjective  Elderly gentleman with a history of metastatic renal cell cancer Recent VATS showing multiple pleural-based masses Had altered mentation on 02/17/2018-this may have been secondary to iron infusion He is better  today-mental status back to baseline, still short of breath which is his baseline He denies any new chest pains or chest discomfort-he does have discomfort related to recent surgery  Objective    Examination: General: Held in a gentleman done, does appear comfortable HENT: Moist oral mucosa Lungs: Clear breath sounds, decreased on the right Cardiovascular: S1-S2 appreciated, irregularly irregular pulse Abdomen: Bowel sounds appreciated Extremities: No edema Neuro: Alert oriented x3 GU: Good urine output  Blood pressure 115/66, pulse 93, temperature (!) 97.3 F (36.3 C), temperature source Oral, resp. rate (!) 35, height 6' (1.829 m), weight 87.7 kg, SpO2 100 %.        Intake/Output Summary (Last 24 hours) at 02/18/2018 1036 Last data filed at 02/18/2018 0800 Gross per 24 hour  Intake 1154.44 ml  Output 550 ml  Net 604.44 ml   Filed Weights   02/16/18 1632 02/18/18 0430  Weight: 86 kg 87.7 kg     Labs   CBC: Recent Labs  Lab 02/16/18 1458 02/16/18 1652 02/17/18 0331 02/18/18 0928  WBC 18.9* 19.5* 16.5* 15.2*  NEUTROABS 15.5* 16.6* 13.6* 12.8*  HGB 10.5* 10.0* 9.5* 9.5*  HCT 32.8* 30.6* 29.8* 30.5*  MCV 83.0 82.0 83.5 85.7  PLT 366 346 343 035   Basic Metabolic Panel: Recent Labs  Lab 02/16/18 1458 02/16/18 1652 02/17/18 0331 02/18/18 0928  NA 137 135 138 140  K 5.2* 5.4* 5.3* 5.2*  CL 103 104 106 109  CO2 25 22 25  20*  GLUCOSE 92 91 81 59*  BUN 27* 30* 26* 27*  CREATININE 1.30* 1.32* 1.29* 1.17  CALCIUM 9.1 8.6* 8.6* 8.6*  MG  --   --  2.1  --    GFR: Estimated Creatinine Clearance: 58 mL/min (by C-G formula  based on SCr of 1.17 mg/dL). Recent Labs  Lab 02/16/18 1458 02/16/18 1652 02/16/18 1701 02/16/18 1955 02/17/18 0331 02/18/18 0928  WBC 18.9* 19.5*  --   --  16.5* 15.2*  LATICACIDVEN  --   --  2.03* 1.39  --   --    Liver Function Tests: Recent Labs  Lab 02/16/18 1458 02/16/18 1652 02/18/18 0928  AST 32 35 19  ALT 31 28 19     ALKPHOS 92* 84 68  BILITOT 0.6 0.7 0.8  PROT 6.6 6.5 5.7*  ALBUMIN 2.7* 2.6* 2.3*   No results for input(s): LIPASE, AMYLASE in the last 168 hours. No results for input(s): AMMONIA in the last 168 hours. ABG    Component Value Date/Time   PHART 7.400 02/02/2018 0959   PCO2ART 30.4 (L) 02/02/2018 0959   PO2ART 143.0 (H) 02/02/2018 0959   HCO3 18.8 (L) 02/02/2018 0959   TCO2 20 (L) 02/02/2018 0959   ACIDBASEDEF 5.0 (H) 02/02/2018 0959   O2SAT 99.0 02/02/2018 0959    Coagulation Profile: No results for input(s): INR, PROTIME in the last 168 hours. Cardiac Enzymes: No results for input(s): CKTOTAL, CKMB, CKMBINDEX, TROPONINI in the last 168 hours. HbA1C: Hgb A1c MFr Bld  Date/Time Value Ref Range Status  01/22/2018 04:01 AM 5.4 4.8 - 5.6 % Final    Comment:    (NOTE) Pre diabetes:          5.7%-6.4% Diabetes:              >6.4% Glycemic control for   <7.0% adults with diabetes    CBG: Recent Labs  Lab 02/17/18 0925  GLUCAP 84     Review of Systems:     Past medical history   Past Medical History:  Diagnosis Date  . Anemia    history of  . Anxiety   . Aortic stenosis   . Arthritis   . Cancer ([place])    Melanoma back of neck  . Cancer of kidney ([place])   . Cardiomyopathy ([place])   . Clear cell carcinoma of kidney, left ([place]) 11/17/2017  . Complication of anesthesia    violent wake up from anesthesia  . Coronary artery disease   . Depression   . Dyspnea   . Esophageal stricture   . Goals of care, counseling/discussion 11/17/2017  . Heart murmur   . History of atrial fibrillation   . History of claustrophobia   . Hyperlipemia   . Hypertension   . Left kidney mass   . Lower extremity edema   . Mitral stenosis   . PVC (premature ventricular contraction)   . RBBB (right bundle branch block with left anterior fascicular block)    Social History   Social History   Socioeconomic History  . Marital status: Widowed    Spouse name: Not on file  . Number of  children: Not on file  . Years of education: Not on file  . Highest education level: Not on file  Occupational History  . Not on file  Social Needs  . Financial resource strain: Not on file  . Food insecurity:    Worry: Not on file    Inability: Not on file  . Transportation needs:    Medical: Not on file    Non-medical: Not on file  Tobacco Use  . Smoking status: Former Smoker    Packs/day: 0.20    Years: 2.00    Pack years: 0.40    Types: Cigarettes    Last  attempt to quit: 07/16/1971    Years since quitting: 46.6  . Smokeless tobacco: Never Used  Substance and Sexual Activity  . Alcohol use: Yes    Comment: daily Scotch  . Drug use: No  . Sexual activity: Not on file  Lifestyle  . Physical activity:    Days per week: Not on file    Minutes per session: Not on file  . Stress: Not on file  Relationships  . Social connections:    Talks on phone: Not on file    Gets together: Not on file    Attends religious service: Not on file    Active member of club or organization: Not on file    Attends meetings of clubs or organizations: Not on file    Relationship status: Not on file  . Intimate partner violence:    Fear of current or ex partner: Not on file    Emotionally abused: Not on file    Physically abused: Not on file    Forced sexual activity: Not on file  Other Topics Concern  . Not on file  Social History Narrative  . Not on file    Family history    Family History  Problem Relation Age of Onset  . Breast cancer Mother   . Diabetes Mellitus II Father   . Diabetes Mellitus II Sister     Allergies Allergies  Allergen Reactions  . Penicillins Rash    Has patient had a PCN reaction causing immediate rash, facial/tongue/throat swelling, SOB or lightheadedness with hypotension: No Has patient had a PCN reaction causing severe rash involving mucus membranes or skin necrosis: No Has patient had a PCN reaction that required hospitalization: No Has patient had a  PCN reaction occurring within the last 10 years: No If all of the above answers are "NO", then may proceed with Cephalosporin use.     Home meds  Prior to Admission medications   Medication Sig Start Date End Date Taking? Authorizing Provider  feeding supplement (ENSURE CLINICAL STRENGTH) LIQD Take 237 mLs by mouth 3 (three) times daily with meals.   Yes [provider]  ferrous sulfate 325 (65 FE) MG EC tablet Take 325 mg by mouth daily with breakfast.   Yes [provider]  Hydrocortisone (GERHARDT'S BUTT CREAM) CREA Apply 1 application topically 3 (three) times daily as needed for irritation.   Yes [provider]  liver oil-zinc oxide (DESITIN) 40 % ointment Apply 1 application topically 3 (three) times daily as needed for irritation.   Yes [provider]  acetaminophen (TYLENOL) 500 MG tablet Take 500 mg by mouth daily.     [provider]  diclofenac sodium (VOLTAREN) 1 % GEL Apply 4 g topically as needed (joint pain).  05/26/10   [provider]  dronedarone (MULTAQ) 400 MG tablet Take 0.5 tablets (200 mg total) by mouth 2 (two) times daily with a meal. 02/07/18   Lars Pinks M, PA-C  fluticasone (FLONASE) 50 MCG/ACT nasal spray Place 2 sprays into both nostrils at bedtime.    [provider]  levofloxacin (LEVAQUIN) 500 MG tablet Take 500 mg by mouth daily. 02/15/18 02/24/18  [provider]  lipase/protease/amylase (CREON) 36000 UNITS CPEP capsule Take 2 capsules (72,000 Units total) by mouth 2 (two) times daily before lunch and supper. 01/12/18   Volanda Napoleon, MD  loperamide (IMODIUM) 2 MG capsule Take 2 mg by mouth daily as needed for diarrhea or loose stools.  [provider]  megestrol (MEGACE) 400 MG/10ML suspension Take 10 mLs (400 mg total) by mouth 2 (two) times daily. 02/07/18   Elgie Collard, PA-C  Melatonin 3 MG TABS Take 3 mg by mouth at bedtime.    [provider]    metoprolol tartrate (LOPRESSOR) 25 MG tablet Take 0.5 tablets (12.5 mg total) by mouth 2 (two) times daily. 02/07/18 03/09/18  Elgie Collard, PA-C  pantoprazole (PROTONIX) 40 MG tablet Take 40 mg by mouth daily.    [provider]  tamsulosin (FLOMAX) 0.4 MG CAPS capsule Take 0.4 mg by mouth daily after supper.    [provider]  traMADol (ULTRAM) 50 MG tablet Take 1 tablet (50 mg total) by mouth every 6 (six) hours as needed (mild pain). Patient not taking: Reported on 02/17/2018 02/07/18   Nani Skillern, PA-C  vitamin B-6 (PYRIDOXINE) 25 MG tablet Take 25 mg by mouth 2 (two) times daily.    [provider]     LOS: 2 days

## 2018-02-18 NOTE — Progress Notes (Signed)
Events of yesterday morning were noted.  I spoke to his family about this this morning.  Mitral exactly what happened to him.  I would not think it would be from the iron since he had iron about 2 weeks ago over it [facility] and had no problems then.  He actually looks pretty good this morning.  He says his breathing is doing better.  His oxygen saturation is 99%.  His heart rate is down a little bit.  He does have some PVCs.  He is hungry.  Unfortunately, he cannot eat anything until he has a swallowing study.  I am going to check a prealbumin on him.  I think this will help [place] out with respect to determining the prognosis.  He has no labs yet today.  He is not hurting.  He does not feel nauseated.  He has had no bleeding.  There is been no diarrhea.  [name] has metastatic renal cell.  I had believe that this is fairly aggressive given the fact that he has had recurrence less than 6 months since he underwent nephrectomy for the primary.  I did speak with his daughter in private.  I told her that from my perspective, everything will improve with nutrition.  I was not sure that he is going to be able to eat that much.  We have on Megace try to help with his appetite.  I can try him on some Marinol.  I thought we had him on this before.  The pre-albumin will really give [place] an idea as to what the timeframe is for Mr. Umbarger.  I am just happy that he looks better today.  I know that the staff in the ICU are doing a fantastic job with him.  His family is very impressed with their professionalism and compassion.  I am not surprised by this.!!!  Lattie Haw, MD  [facility] 6:38

## 2018-02-18 NOTE — Progress Notes (Signed)
PROGRESS NOTE    Barry Curry  PPI:[id number] DOB: 1941/02/14 DOA: 02/16/2018 PCP: [name], PA-C   Brief Narrative: Barry Curry is a 77 y.o. 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 Presented secondary to shortness of breath and found to have a RLL pneumonia.   Assessment & Plan:   Principal Problem:   Healthcare-associated pneumonia Active Problems:   PAF (paroxysmal atrial fibrillation) (HCC)   Clear cell carcinoma of kidney, left (HCC)   CAD (coronary artery disease)   CKD (chronic kidney disease), stage III (HCC)   Pressure injury of skin   Protein-calorie malnutrition, severe   Right lower lobe pneumonia HCAP treatment. Afebrile currently. On antibiotics -Continue Vancomycin/cefepime -Sputum culture, urine strep/legionella antigen -Blood cultures  Acute respiratory failure with hypoxia Appears to be secondary to an aspiration episode while drinking some juice this morning. Patient was also receiving an IV iron infusion. Associated unresponsive episode lasting less than 5 minutes per discussion with CCM. -SLP eval: regular diet -Non-rebreather. Patient declines BiPAP   Paroxysmal atrial fibrillation Patient currently in sinus rhythm. On dronedarone and metoprolol as an outpatient. -restart oral metoprolol and dronedarone  CAD -Continue metoprolol  CKD stage III Stable  Metastatic renal cell carcinoma Seen by oncologist today. Patient with significant chest burden.   DVT prophylaxis: Heparin subq Code Status:   Code Status: DNR Family Communication: Daughter at bedside Disposition Plan: Discharge pending medical improvement.   Consultants:   CCM  Procedures:    None  Antimicrobials:  Levaquin  Vancomycin  Cefepime    Subjective: Feeling better today.  Objective: Vitals:   02/18/18 1400 02/18/18 1500 02/18/18 1600 02/18/18 1700  BP: (!) 94/56 106/61 (!) 102/40 (!) 96/55  Pulse: (!) 109 (!) 111 (!) 108 (!) 106  Resp: (!) 45 (!) 46 (!) 45 (!) 45  Temp:   97.9 F (36.6 C)   TempSrc:   Oral   SpO2: 95% 95% 96% 95%  Weight:      Height:        Intake/Output Summary (Last 24 hours) at 02/18/2018 1732 Last data filed at 02/18/2018 1500 Gross per 24 hour  Intake 1165.79 ml  Output 900 ml  Net 265.79 ml   Filed Weights   02/16/18 1632 02/18/18 0430  Weight: 86 kg 87.7 kg    Examination:  General exam: Appears calm and comfortable Respiratory system: Decreased breath sounds in right lower lobe with rales. Respiratory effort increased Cardiovascular system: S1 & S2 heard, RRR. No murmurs. Gastrointestinal system: Abdomen is nondistended, soft and nontender. Normal bowel sounds heard. Central nervous system: Alert and oriented. No focal neurological deficits. Extremities: No edema. No calf tenderness Skin: No cyanosis. No rashes Psychiatry: Judgement and insight appear normal. Mood & affect appropriate.     Data Reviewed: I have personally reviewed following labs and imaging studies  CBC: Recent Labs  Lab 02/16/18 1458 02/16/18 1652 02/17/18 0331 02/18/18 0928  WBC 18.9* 19.5* 16.5* 15.2*  NEUTROABS 15.5* 16.6* 13.6* 12.8*  HGB 10.5* 10.0* 9.5* 9.5*  HCT 32.8* 30.6* 29.8* 30.5*  MCV 83.0 82.0 83.5 85.7  PLT 366 346 343 063   Basic Metabolic Panel: Recent Labs  Lab 02/16/18 1458 02/16/18 1652 02/17/18 0331 02/18/18 0928  NA 137 135 138 140  K 5.2* 5.4* 5.3* 5.2*  CL 103 104 106 109  CO2 25 22 25  20*  GLUCOSE 92 91 81 59*  BUN 27* 30* 26* 27*  CREATININE 1.30* 1.32* 1.29* 1.17  CALCIUM 9.1 8.6* 8.6* 8.6*  MG  --   --  2.1  --    GFR: Estimated Creatinine Clearance: 58 mL/min (by C-G formula based on  SCr of 1.17 mg/dL). Liver Function Tests: Recent Labs  Lab 02/16/18 1458 02/16/18 1652 02/18/18 0928  AST 32 35 19  ALT 31 28 19   ALKPHOS 92* 84 68  BILITOT 0.6 0.7 0.8  PROT 6.6 6.5 5.7*  ALBUMIN 2.7* 2.6* 2.3*   No results for input(s): LIPASE, AMYLASE in the last 168 hours. No results for input(s): AMMONIA in the last 168 hours. Coagulation Profile: No results for input(s): INR, PROTIME in the last 168 hours. Cardiac Enzymes: No results for input(s): CKTOTAL, CKMB, CKMBINDEX, TROPONINI in the last 168 hours. BNP (last 3 results) No results for input(s): PROBNP in the last 8760 hours. HbA1C: No results for input(s): HGBA1C in the last 72 hours. CBG: Recent Labs  Lab 02/17/18 0925  GLUCAP 84   Lipid Profile: No results for input(s): CHOL, HDL, LDLCALC, TRIG, CHOLHDL, LDLDIRECT in the last 72 hours. Thyroid Function Tests: No results for input(s): TSH, T4TOTAL, FREET4, T3FREE, THYROIDAB in the last 72 hours. Anemia Panel: Recent Labs    02/16/18 1458  FERRITIN 2,879*  TIBC 159*  IRON 14*   Sepsis Labs: Recent Labs  Lab 02/16/18 1701 02/16/18 1955  LATICACIDVEN 2.03* 1.39    Recent Results (from the past 240 hour(s))  Blood Culture (routine x 2)     Status: None (Preliminary result)   Collection Time: 02/16/18  5:20 PM  Result Value Ref Range Status   Specimen Description   Final    BLOOD BLOOD LEFT ARM Performed at [facility], [address] [place], [place] [address]    Special Requests   Final    BOTTLES DRAWN AEROBIC AND ANAEROBIC Blood Culture adequate volume Performed at [facility], [address] [place], [place] [address]    Culture   Final    NO GROWTH 2 DAYS Performed at [facility], [place] [address] [place], [place] [address]    Report Status PENDING  Incomplete  MRSA PCR Screening     Status: None   Collection Time: 02/16/18 11:03 PM  Result Value Ref Range Status   MRSA by PCR NEGATIVE NEGATIVE Final     Comment:        The GeneXpert MRSA Assay (FDA approved for NASAL specimens only), is one component of a comprehensive MRSA colonization surveillance program. It is not intended to diagnose MRSA infection nor to guide or monitor treatment for MRSA infections. Performed at [facility], [place] [address], [place], [place] [address]   Blood Culture (routine x 2)     Status: None (Preliminary result)   Collection Time: 02/16/18 11:31 PM  Result Value Ref Range Status   Specimen Description   Final    BLOOD RIGHT ANTECUBITAL Performed at [facility] [address] [place], [place] [address]    Special Requests   Final    BOTTLES DRAWN AEROBIC AND ANAEROBIC Blood Culture adequate volume Performed at [facility] [address] [place], [place] [address]    Culture   Final    NO GROWTH 1 DAY Performed at [facility], [address] [place], [place] [address]    Report Status PENDING  Incomplete         Radiology Studies: Dg Chest 2 View  Result Date: 02/16/2018 CLINICAL DATA:  Shortness of breath. History of melanoma and renal cell carcinoma EXAM: CHEST - 2 VIEW COMPARISON:  February 07, 2018 FINDINGS: There is loculated pleural effusion on the right inferolaterally. There is also fluid tracking along the right major fissure. The left lung is clear. Heart size and pulmonary vascularity are normal. There is less subcutaneous air on the right compared to most recent study. Pacemaker leads are attached to the right atrium and right ventricle. There is aortic atherosclerosis. No evident adenopathy. 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 Central catheter has been removed.  No evident pneumothorax. IMPRESSION: Persistent loculated effusion on the right, most notably along the inferolateral aspect. Underlying airspace opacity could be obscured by this effusion. Left lung clear. Stable cardiac silhouette. There is  aortic atherosclerosis. Pacemaker leads attached to right atrium and right ventricle. Aortic Atherosclerosis (ICD10-I70.0). Electronically Signed   By: Lowella Grip III M.D.   On: 02/16/2018 18:01   Ct Angio Chest Pe W And/or Wo Contrast  Result Date: 02/16/2018 CLINICAL DATA:  Cough. Pneumonia. Recurrent pleural effusions of the right lung. History of metastatic renal carcinoma. Left nephrectomy performed in January 2019. EXAM: CT ANGIOGRAPHY CHEST WITH CONTRAST TECHNIQUE: Multidetector CT imaging of the chest was performed using the standard protocol during bolus administration of intravenous contrast. Multiplanar CT image reconstructions and MIPs were obtained to evaluate the vascular anatomy. CONTRAST:  139mL ISOVUE-370 IOPAMIDOL (ISOVUE-370) INJECTION 76% COMPARISON:  Current chest radiograph and prior chest radiographs. FINDINGS: Cardiovascular: There is satisfactory opacification of the pulmonary arteries to the segmental level. There is no evidence of a pulmonary embolism. Heart is normal size. No pericardial effusion. Mild left coronary artery calcifications. Great vessels are normal in caliber. Mild aortic atherosclerosis. 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 Mediastinum/Nodes: Neck base assessment limited by artifact from bilateral shoulder prosthesis. No visualized neck base or axillary mass or adenopathy. There is pleural based opacity that encroaches upon the right aspect of the mediastinum. There is no convincing mediastinal based mass. No discrete enlarged lymph node. Trachea is patent. Esophagus is mildly distended. Lungs/Pleura: With a small amount of associated loculated pleural based fluid. There are cavitary lesions in the right lower lobe with air-fluid levels. Some fluid tracks along the right oblique fissure. No left pleural effusion or pleural based masses. 4 mm nodule in the right upper lobe, image 55, series 5. Patchy consolidation  noted in the posterior right upper lobe adjacent to the cavitary lesions mild peribronchovascular opacity in the left lower lobe this most likely subsegmental atelectasis. Upper Abdomen: 2.6 x 2.0 cm right adrenal mass. 2 cm hypoattenuating mass in the posterior segment of the right liver lobe, which is likely a cyst. No acute findings in the upper abdomen. Musculoskeletal: No fractures or acute finding. No osteoblastic or osteolytic lesions. Subcutaneous emphysema seen along the right chest wall, decreased from older prior chest radiographs. Review of the MIP images confirms the above findings. IMPRESSION: 1. No evidence of a pulmonary embolism. 2. Extensive right hemithorax pleural based masses consistent pleural based metastatic disease. 3. Small amount of loculated right pleural fluid. Cystic lesions in the right lower lobe with adjacent patchy consolidation. These latter areas of abnormality may reflect infection or cavitary neoplastic disease. 4. 2.6 cm right adrenal mass consistent  with metastatic disease. Aortic Atherosclerosis (ICD10-I70.0). Electronically Signed   By: Lajean Manes M.D.   On: 02/16/2018 19:40   Dg Chest Port 1 View  Result Date: 02/17/2018 CLINICAL DATA:  Tachypnea. Respiratory failure. History of metastatic renal carcinoma. EXAM: PORTABLE CHEST 1 VIEW COMPARISON:  CT 02/16/2018.  Chest x-ray 07/18/2017. FINDINGS: Cardiac pacer noted with lead tips over the right atrium right ventricle. Heart size normal. Diffuse right lung infiltrate. Multiple pleural-based densities are again noted consistent with pleural base metastatic disease bilateral shoulder replacements. Right chest wall subcutaneous emphysema again noted. IMPRESSION: 1. Diffuse right lung infiltrate suggesting pneumonia. Multiple pleural-based densities are again noted consistent with pleural base metastatic lesions again noted. 2. Right chest wall subcutaneous emphysema again noted. No pneumothorax noted. Electronically  Signed   By: Marcello Moores  Register   On: 02/17/2018 09:57        Scheduled Meds: . dronedarone  200 mg Oral BID WC  . feeding supplement (ENSURE ENLIVE)  237 mL Oral TID WC  . heparin  5,000 Units Subcutaneous Q8H  . lipase/protease/amylase  72,000 Units Oral BID AC  . mouth rinse  15 mL Mouth Rinse BID  . megestrol  400 mg Oral BID  . Melatonin  3 mg Oral QHS  . metoprolol tartrate  2.5 mg Intravenous Q12H  . pantoprazole  40 mg Oral Daily  . vitamin B-6  25 mg Oral BID  . sodium chloride flush  3 mL Intravenous Q12H  . tamsulosin  0.4 mg Oral QPC supper   Continuous Infusions: . sodium chloride Stopped (02/18/18 0329)  . ceFEPime (MAXIPIME) IV Stopped (02/18/18 1149)     LOS: 2 days     Cordelia Poche, MD [facility] 02/18/2018, 5:32 PM Pager: [phone]  If 7PM-7AM, please contact night-coverage [email or web address] 02/18/2018, 5:32 PM

## 2018-02-18 NOTE — Evaluation (Addendum)
Clinical/Bedside Swallow Evaluation Patient Details  Name: GEOFFERY AULTMAN MRN: [id number] Date of Birth: 23-Mar-1941  Today's Date: 02/18/2018 Time: SLP Start Time (ACUTE ONLY): 6063 SLP Stop Time (ACUTE ONLY): 1020 SLP Time Calculation (min) (ACUTE ONLY): 55 min  Past Medical History:  Past Medical History:  Diagnosis Date  . Anemia    history of  . Anxiety   . Aortic stenosis   . Arthritis   . Cancer ([place])    Melanoma back of neck  . Cancer of kidney ([place])   . Cardiomyopathy ([place])   . Clear cell carcinoma of kidney, left ([place]) 11/17/2017  . Complication of anesthesia    violent wake up from anesthesia  . Coronary artery disease   . Depression   . Dyspnea   . Esophageal stricture   . Goals of care, counseling/discussion 11/17/2017  . Heart murmur   . History of atrial fibrillation   . History of claustrophobia   . Hyperlipemia   . Hypertension   . Left kidney mass   . Lower extremity edema   . Mitral stenosis   . PVC (premature ventricular contraction)   . RBBB (right bundle branch block with left anterior fascicular block)    Past Surgical History:  Past Surgical History:  Procedure Laterality Date  . CARDIOVERSION    . COLONOSCOPY    . ESOPHAGEAL DILATION    . IR THORACENTESIS ASP PLEURAL SPACE W/IMG GUIDE  01/26/2018  . KNEE ARTHROSCOPY Left   . KNEE ARTHROSCOPY Right   . PLEURAL BIOPSY Right 02/01/2018   Procedure: PLEURAL BIOPSY;  Surgeon: Gaye Pollack, MD;  Location: [facility];  Service: Thoracic;  Laterality: Right;  . PLEURAL EFFUSION DRAINAGE Right 02/01/2018   Procedure: DRAINAGE OF PLEURAL EFFUSION;  Surgeon: Gaye Pollack, MD;  Location: [facility];  Service: Thoracic;  Laterality: Right;  . ROBOT ASSISTED LAPAROSCOPIC NEPHRECTOMY Left 07/23/2017   Procedure: XI ROBOTIC ASSISTED LAPAROSCOPIC NEPHRECTOMY;  Surgeon: Alexis Frock, MD;  Location: [facility];  Service: Urology;  Laterality: Left;  . SHOULDER ARTHROSCOPY N/A   . TALC PLEURODESIS Right 02/01/2018    Procedure: Pietro Cassis;  Surgeon: Gaye Pollack, MD;  Location: [facility] OR;  Service: Thoracic;  Laterality: Right;  . TOTAL HIP ARTHROPLASTY Left   . TOTAL HIP ARTHROPLASTY Right   . TOTAL KNEE ARTHROPLASTY Left   . TOTAL KNEE ARTHROPLASTY Right   . TOTAL SHOULDER REPLACEMENT Left   . TOTAL SHOULDER REPLACEMENT Right   . UPPER GI ENDOSCOPY    . VIDEO ASSISTED THORACOSCOPY Right 02/01/2018   Procedure: VIDEO ASSISTED THORACOSCOPY;  Surgeon: Gaye Pollack, MD;  Location: [facility] OR;  Service: Thoracic;  Laterality: Right;   HPI:  77 year old male admitted 02/16/18 with DOE, SOB, cough and general malaise.  PMH: Anemia, Anxiety, Aortic stenosis, Arthritis, Cancer ([place]), Cancer of kidney ([place]), Cardiomyopathy ([place]), Clear cell carcinoma of kidney, left ([place]) (0/16/0109), Complication of anesthesia, Coronary artery disease, Depression, Dyspnea, Esophageal stricture, Goals of care, counseling/discussion (11/17/2017), Heart murmur, History of atrial fibrillation, History of claustrophobia, Hyperlipemia, Hypertension, Left kidney mass, Lower extremity edema, Mitral stenosis, PVC (premature ventricular contraction), and RBBB (right bundle branch block with left anterior fascicular block), esophagus mildly distended, ? Necrotic right lower lobe. CXR = right lung infiltrate suggesting PNA. RN reports pt became unresponsive following administration of medication yesterday.   Assessment / Plan / Recommendation Clinical Impression  Pt seen at bedside for assessment of swallow function and identification of least restrictive diet. Per  RN, pt became unresponsive following administration of medication yesterday. Pt reports history of esophageal dysphagia, but not oropharyngeal. He has been tolerating regular solids and thin liquids at home. Oral care was completed with suction. Pt accepted trials of ice chips, thin liquid, puree, and solid consistencies. No oral difficulties or residue noted, and no overt s/s aspiration  observed. RN provided medication, which pt tolerated without difficulty (1 tablet, and a liquid medication). Will recommend resuming regular diet and thin liquids, meds one at a time. Safe swallow precautions were reviewed with pt/wife and posted at [facility]. RN and MD informed of results and recommendations. SLP will follow for diet tolerance assessment, education, and to determine if objective swallow evaluation is needed.    SLP Visit Diagnosis: Dysphagia, unspecified (R13.10)    Aspiration Risk  Mild aspiration risk    Diet Recommendation Regular;Thin liquid   Liquid Administration via: Cup;Straw Medication Administration: Whole meds with liquid Supervision: Patient able to self feed Compensations: Minimize environmental distractions;Slow rate;Small sips/bites;Follow solids with liquid Postural Changes: Seated upright at 90 degrees;Remain upright for at least 30 minutes after po intake    Other  Recommendations Oral Care Recommendations: Oral care QID Other Recommendations: Have oral suction available   Follow up Recommendations (TBD)      Frequency and Duration min 2x/week  2 weeks       Prognosis Prognosis for Safe Diet Advancement: Good      Swallow Study   General Date of Onset: 02/16/18 HPI: 77 year old male admitted 02/16/18 with DOE, SOB, cough and general malaise.  PMH: Anemia, Anxiety, Aortic stenosis, Arthritis, Cancer ([place]), Cancer of kidney ([place]), Cardiomyopathy ([place]), Clear cell carcinoma of kidney, left ([place]) (3/42/8768), Complication of anesthesia, Coronary artery disease, Depression, Dyspnea, Esophageal stricture, Goals of care, counseling/discussion (11/17/2017), Heart murmur, History of atrial fibrillation, History of claustrophobia, Hyperlipemia, Hypertension, Left kidney mass, Lower extremity edema, Mitral stenosis, PVC (premature ventricular contraction), and RBBB (right bundle branch block with left anterior fascicular block), esophagus mildly distended, ? Necrotic  right lower lobe. CXR = right lung infiltrate suggesting PNA. RN reports pt became unresponsive following administration of medication yesterday. Type of Study: Bedside Swallow Evaluation Previous Swallow Assessment: esophagram January 2019 - distal esophageal stricture. Diet Prior to this Study: NPO Temperature Spikes Noted: No Respiratory Status: Nasal cannula History of Recent Intubation: No Behavior/Cognition: Alert;Cooperative;Pleasant mood Oral Cavity Assessment: Within Functional Limits Oral Care Completed by SLP: Yes Oral Cavity - Dentition: Adequate natural dentition Vision: Functional for self-feeding Self-Feeding Abilities: Able to feed self Patient Positioning: Upright in bed Baseline Vocal Quality: Normal Volitional Cough: Strong    Oral/Motor/Sensory Function Overall Oral Motor/Sensory Function: Within functional limits   Ice Chips Ice chips: Within functional limits Presentation: Spoon   Thin Liquid Thin Liquid: Within functional limits Presentation: Straw;Cup    Nectar Thick Nectar Thick Liquid: Not tested   Honey Thick Honey Thick Liquid: Not tested   Puree Puree: Within functional limits Presentation: Spoon;Self Fed   Solid     Solid: Within functional limits Presentation: [place] [facility]. Quentin [facility], [facility], [place] Speech Language Pathologist [phone]  Shonna Chock 02/18/2018,10:37 AM

## 2018-02-18 NOTE — Progress Notes (Signed)
Physical Therapy Treatment Patient Details Name: Barry Curry MRN: [id number] DOB: 1941-03-11 Today's Date: 02/18/2018    History of Present Illness Pt admitted with SOB and dx with HCAP.  Pt from SNF and with hx of RBBB< Cardiomyopathy, Kidney Ca with mets to Lung, afib, Bil TSR, Bil THR, Bil TKR    PT Comments    Supine:        BP 74/44, HR 100, RA 95% EOB            BP 104/45, HR 120, RA 90% Standing    : BP 95/72, HR 113, RA 91% Did not attempt amb due to low BP and max c/o feeling tired.  Assisted to recliner and positioned to comfort.    Follow Up Recommendations  SNF     Equipment Recommendations  None recommended by PT    Recommendations for Other Services       Precautions / Restrictions Precautions Precautions: Fall Precaution Comments: chair follow, watch sats Restrictions Weight Bearing Restrictions: No    Mobility  Bed Mobility Overal bed mobility: Needs Assistance Bed Mobility: Supine to Sit     Supine to sit: Supervision;Min guard     General bed mobility comments: Increased time with use of rails able without assist  Transfers Overall transfer level: Needs assistance Equipment used: Rolling walker (2 wheeled) Transfers: Sit to/from Stand Sit to Stand: Min assist         General transfer comment: cues for use of hands; assist to balance/steady in standing/multiple lines  Ambulation/Gait Ambulation/Gait assistance: Min assist;+2 safety/equipment Gait Distance (Feet): 1 Feet Assistive device: Rolling walker (2 wheeled)       General Gait Details: only able to take a few steps from bed to recliner due to low BP and MAX c/o feeling weak/tired   Marine scientist [place] (Stroke Patients Only)       Balance                                            Cognition Arousal/Alertness: Awake/alert Behavior During Therapy: WFL for tasks assessed/performed Overall Cognitive  Status: Within Functional Limits for tasks assessed                                 General Comments: tired      Exercises      General Comments        Pertinent Vitals/Pain Pain Assessment: No/denies pain    Home Living                      Prior Function            PT Goals (current goals can now be found in the care plan section) Progress towards PT goals: Progressing toward goals    Frequency    Min 3X/week      PT Plan Current plan remains appropriate    Co-evaluation              AM-PAC PT "6 Clicks" Daily Activity  Outcome Measure  Difficulty turning over in bed (including adjusting bedclothes, sheets and blankets)?: A Little Difficulty moving from lying on back to sitting on the side of the bed? : A  Little Difficulty sitting down on and standing up from a chair with arms (e.g., wheelchair, bedside commode, etc,.)?: A Little Help needed moving to and from a bed to chair (including a wheelchair)?: A Little Help needed walking in hospital room?: A Little Help needed climbing 3-5 steps with a railing? : A Lot 6 Click Score: 17    End of Session Equipment Utilized During Treatment: Gait belt Activity Tolerance: Patient limited by fatigue Patient left: in chair;with call bell/phone within reach;with family/visitor present Nurse Communication: Mobility status PT Visit Diagnosis: Unsteadiness on feet (R26.81);Other abnormalities of gait and mobility (R26.89);Muscle weakness (generalized) (M62.81);Difficulty in walking, not elsewhere classified (R26.2)     Time: 0045-9977 PT Time Calculation (min) (ACUTE ONLY): 24 min  Charges:  $Gait Training: 8-22 mins $Therapeutic Activity: 8-22 mins                     Rica Koyanagi  PTA [facility]  Acute  Rehab Pager      [phone]

## 2018-02-18 NOTE — Care Management Note (Signed)
Case Management Note  Patient Details  Name: Barry Curry MRN: [id number] Date of Birth: 1940-11-30  Subjective/Objective:                  Patient with a history of metastatic kidney cancer, pneumonia Was brought into the hospital for weakness-resided at a nursing home Right pleural effusion, status post VATS procedure-pleural-based mass positive for poorly differentiated cancer-metastasis from his kidney Post nephrectomy in January Has not been doing well generally  He was been stabilized in the stepdown unit While taking his medications today-he basically went unresponsive  Was asked to see him during this episode Struggling with his breathing for a few minutes, his eyes rolled back Oxygen supplementation, medications for anxiety He was able to state that he did not have any chest pains or chest discomfort, just could not catch his breath, weak cough  Plan: Following for progression=improved today Following for cm needs Expected Discharge Date:  (unknown)               Expected Discharge Plan:  [place]  In-House Referral:  Clinical Social Work  Discharge planning Services  CM Consult  Post Acute Care Choice:    Choice offered to:     DME Arranged:    DME Agency:     HH Arranged:    [place] Agency:     Status of Service:  In process, will continue to follow  If discussed at Long Length of Stay Meetings, dates discussed:    Additional Comments:  Leeroy Cha, RN 02/18/2018, 10:05 AM

## 2018-02-19 LAB — CBC WITH DIFFERENTIAL/PLATELET
BASOS ABS: 0 10*3/uL (ref 0.0–0.1)
BASOS PCT: 0 %
EOS ABS: 0.1 10*3/uL (ref 0.0–0.7)
EOS PCT: 1 %
HCT: 29.4 % — ABNORMAL LOW (ref 39.0–52.0)
HEMOGLOBIN: 9.3 g/dL — AB (ref 13.0–17.0)
LYMPHS ABS: 1.8 10*3/uL (ref 0.7–4.0)
Lymphocytes Relative: 11 %
MCH: 26.9 pg (ref 26.0–34.0)
MCHC: 31.6 g/dL (ref 30.0–36.0)
MCV: 85 fL (ref 78.0–100.0)
Monocytes Absolute: 1 10*3/uL (ref 0.1–1.0)
Monocytes Relative: 6 %
Neutro Abs: 13.2 10*3/uL — ABNORMAL HIGH (ref 1.7–7.7)
Neutrophils Relative %: 82 %
PLATELETS: 290 10*3/uL (ref 150–400)
RBC: 3.46 MIL/uL — AB (ref 4.22–5.81)
RDW: 18.3 % — ABNORMAL HIGH (ref 11.5–15.5)
WBC: 16.1 10*3/uL — AB (ref 4.0–10.5)

## 2018-02-19 LAB — COMPREHENSIVE METABOLIC PANEL
ALBUMIN: 2 g/dL — AB (ref 3.5–5.0)
ALK PHOS: 67 U/L (ref 38–126)
ALT: 25 U/L (ref 0–44)
AST: 31 U/L (ref 15–41)
Anion gap: 4 — ABNORMAL LOW (ref 5–15)
BUN: 30 mg/dL — AB (ref 8–23)
CALCIUM: 8.3 mg/dL — AB (ref 8.9–10.3)
CHLORIDE: 110 mmol/L (ref 98–111)
CO2: 24 mmol/L (ref 22–32)
CREATININE: 1.25 mg/dL — AB (ref 0.61–1.24)
GFR calc non Af Amer: 53 mL/min — ABNORMAL LOW (ref >60)
GLUCOSE: 118 mg/dL — AB (ref 70–99)
Potassium: 4.9 mmol/L (ref 3.5–5.1)
SODIUM: 138 mmol/L (ref 135–145)
Total Bilirubin: 0.6 mg/dL (ref 0.3–1.2)
Total Protein: 5.4 g/dL — ABNORMAL LOW (ref 6.5–8.1)

## 2018-02-19 LAB — GLUCOSE, CAPILLARY: Glucose-Capillary: 97 mg/dL (ref 70–99)

## 2018-02-19 NOTE — Progress Notes (Signed)
PROGRESS NOTE    Barry Curry  KGU:[id number] DOB: Jun 08, 1941 DOA: 02/16/2018 PCP: [name], PA-C   Brief Narrative: Barry Curry is a 77 y.o. 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 Presented secondary to shortness of breath and found to have a RLL pneumonia.   Assessment & Plan:   Principal Problem:   Healthcare-associated pneumonia Active Problems:   PAF (paroxysmal atrial fibrillation) (HCC)   Clear cell carcinoma of kidney, left (HCC)   CAD (coronary artery disease)   CKD (chronic kidney disease), stage III (HCC)   Pressure injury of skin   Protein-calorie malnutrition, severe   Right lower lobe pneumonia HCAP treatment. Afebrile currently. On antibiotics. Urine strep negative. Blood cultures no growth to date. Still very tachypneic. -Continue Cefepime -Sputum culture, urine legionella antigen -Blood cultures  Acute respiratory failure with hypoxia Resolved. Possible aspiration episode. Patient also received IV iron around the same time of his unresponsive episode. -SLP eval: regular diet   Paroxysmal atrial fibrillation Patient currently in sinus rhythm. On dronedarone and metoprolol as an outpatient. -Continue oral metoprolol and dronedarone  CAD -Continue metoprolol  CKD stage III Stable  Metastatic renal cell carcinoma Seen by oncologist. Patient with significant chest burden.   DVT prophylaxis: Heparin subq Code Status:   Code Status: DNR Family Communication: Daughters at bedside Disposition Plan: Keep in stepdown. Discharge pending medical improvement.   Consultants:   CCM  Procedures:   None  Antimicrobials:  Levaquin  Vancomycin  Cefepime    Subjective: Feeling better. Some intermittent  dyspnea.  Objective: Vitals:   02/19/18 1046 02/19/18 1100 02/19/18 1200 02/19/18 1224  BP: 110/63 (!) 101/53 (!) 74/39 (!) 122/59  Pulse: (!) 112 (!) 110 (!) 112 (!) 108  Resp: (!) 34 (!) 31 (!) 32 (!) 32  Temp:   97.8 F (36.6 C)   TempSrc:   Oral   SpO2: 93% 92% 94% 92%  Weight:      Height:        Intake/Output Summary (Last 24 hours) at 02/19/2018 1409 Last data filed at 02/19/2018 0900 Gross per 24 hour  Intake 883.87 ml  Output 695 ml  Net 188.87 ml   Filed Weights   02/16/18 1632 02/18/18 0430  Weight: 86 kg 87.7 kg    Examination:  General exam: Appears calm and comfortable  Respiratory system: Diminished on right lower lobe. Increased respiratory effort Cardiovascular system: S1 & S2 heard, tachycardia, regular rhytm. Gastrointestinal system: Abdomen is nondistended, soft and nontender. No organomegaly or masses felt. Normal bowel sounds heard. Central nervous system: Alert and oriented. No focal neurological deficits. Extremities: No edema. No calf tenderness Skin: No cyanosis. No rashes Psychiatry: Judgement and insight appear normal. Mood & affect appropriate.     Data Reviewed: I have personally reviewed following labs and imaging studies  CBC: Recent Labs  Lab 02/16/18 1458 02/16/18 1652 02/17/18 0331 02/18/18 0928 02/19/18 0348  WBC 18.9* 19.5* 16.5* 15.2* 16.1*  NEUTROABS 15.5* 16.6* 13.6* 12.8* 13.2*  HGB 10.5* 10.0* 9.5* 9.5* 9.3*  HCT 32.8* 30.6* 29.8* 30.5* 29.4*  MCV 83.0 82.0 83.5 85.7 85.0  PLT 366 346 343 309 628   Basic Metabolic Panel: Recent Labs  Lab 02/16/18 1458 02/16/18 1652 02/17/18 0331 02/18/18 0928 02/19/18 0348  NA 137 135 138 140  138  K 5.2* 5.4* 5.3* 5.2* 4.9  CL 103 104 106 109 110  CO2 25 22 25  20* 24  GLUCOSE 92 91 81 59* 118*  BUN 27* 30* 26* 27* 30*  CREATININE 1.30* 1.32* 1.29* 1.17 1.25*  CALCIUM 9.1 8.6* 8.6* 8.6* 8.3*  MG  --   --  2.1  --   --    GFR: Estimated Creatinine Clearance: 53.5 mL/min  (A) (by C-G formula based on SCr of 1.25 mg/dL (H)). Liver Function Tests: Recent Labs  Lab 02/16/18 1458 02/16/18 1652 02/18/18 0928 02/19/18 0348  AST 32 35 19 31  ALT 31 28 19 25   ALKPHOS 92* 84 68 67  BILITOT 0.6 0.7 0.8 0.6  PROT 6.6 6.5 5.7* 5.4*  ALBUMIN 2.7* 2.6* 2.3* 2.0*   No results for input(s): LIPASE, AMYLASE in the last 168 hours. No results for input(s): AMMONIA in the last 168 hours. Coagulation Profile: No results for input(s): INR, PROTIME in the last 168 hours. Cardiac Enzymes: No results for input(s): CKTOTAL, CKMB, CKMBINDEX, TROPONINI in the last 168 hours. BNP (last 3 results) No results for input(s): PROBNP in the last 8760 hours. HbA1C: No results for input(s): HGBA1C in the last 72 hours. CBG: Recent Labs  Lab 02/17/18 0925 02/19/18 0810  GLUCAP 84 97   Lipid Profile: No results for input(s): CHOL, HDL, LDLCALC, TRIG, CHOLHDL, LDLDIRECT in the last 72 hours. Thyroid Function Tests: No results for input(s): TSH, T4TOTAL, FREET4, T3FREE, THYROIDAB in the last 72 hours. Anemia Panel: Recent Labs    02/16/18 1458  FERRITIN 2,879*  TIBC 159*  IRON 14*   Sepsis Labs: Recent Labs  Lab 02/16/18 1701 02/16/18 1955  LATICACIDVEN 2.03* 1.39    Recent Results (from the past 240 hour(s))  Blood Culture (routine x 2)     Status: None (Preliminary result)   Collection Time: 02/16/18  5:20 PM  Result Value Ref Range Status   Specimen Description   Final    BLOOD BLOOD LEFT ARM Performed at [facility], [address] [place], [place] [address]    Special Requests   Final    BOTTLES DRAWN AEROBIC AND ANAEROBIC Blood Culture adequate volume Performed at [facility], [address] [place], [place] [address]    Culture   Final    NO GROWTH 3 DAYS Performed at [facility], [place] [address] [place], [place] [address]    Report Status PENDING  Incomplete  MRSA PCR Screening     Status: None   Collection Time:  02/16/18 11:03 PM  Result Value Ref Range Status   MRSA by PCR NEGATIVE NEGATIVE Final    Comment:        The GeneXpert MRSA Assay (FDA approved for NASAL specimens only), is one component of a comprehensive MRSA colonization surveillance program. It is not intended to diagnose MRSA infection nor to guide or monitor treatment for MRSA infections. Performed at [facility], [place] [address] [place], [place] [address]   Blood Culture (routine x 2)     Status: None (Preliminary result)   Collection Time: 02/16/18 11:31 PM  Result Value Ref Range Status   Specimen Description   Final    BLOOD RIGHT ANTECUBITAL Performed at [facility] [address] [place], [place] [address]    Special Requests   Final    BOTTLES DRAWN AEROBIC AND ANAEROBIC Blood Culture adequate volume Performed at [facility]  [place] [address] [place], [place] [address]    Culture   Final    NO GROWTH 2 DAYS Performed at [place] [address], [place], [place] [address]    Report Status PENDING  Incomplete         Radiology Studies: No results found.      Scheduled Meds: . dronedarone  200 mg Oral BID WC  . feeding supplement (ENSURE ENLIVE)  237 mL Oral TID WC  . heparin  5,000 Units Subcutaneous Q8H  . lipase/protease/amylase  72,000 Units Oral BID AC  . mouth rinse  15 mL Mouth Rinse BID  . megestrol  400 mg Oral BID  . Melatonin  3 mg Oral QHS  . metoprolol tartrate  12.5 mg Oral BID  . pantoprazole  40 mg Oral Daily  . vitamin B-6  25 mg Oral BID  . sodium chloride flush  3 mL Intravenous Q12H  . tamsulosin  0.4 mg Oral QPC supper   Continuous Infusions: . sodium chloride 250 mL (02/19/18 1227)  . ceFEPime (MAXIPIME) IV 1 g (02/19/18 1228)     LOS: 3 days     Cordelia Poche, MD [facility] 02/19/2018, 2:09 PM Pager: [phone]  If 7PM-7AM, please contact night-coverage [email or web address] 02/19/2018,  2:09 PM

## 2018-02-20 ENCOUNTER — Inpatient Hospital Stay (HOSPITAL_COMMUNITY): Payer: Medicare Other

## 2018-02-20 LAB — COMPREHENSIVE METABOLIC PANEL
ALT: 24 U/L (ref 0–44)
ANION GAP: 6 (ref 5–15)
AST: 27 U/L (ref 15–41)
Albumin: 2 g/dL — ABNORMAL LOW (ref 3.5–5.0)
Alkaline Phosphatase: 67 U/L (ref 38–126)
BILIRUBIN TOTAL: 0.4 mg/dL (ref 0.3–1.2)
BUN: 28 mg/dL — ABNORMAL HIGH (ref 8–23)
CO2: 25 mmol/L (ref 22–32)
Calcium: 8.4 mg/dL — ABNORMAL LOW (ref 8.9–10.3)
Chloride: 108 mmol/L (ref 98–111)
Creatinine, Ser: 1.13 mg/dL (ref 0.61–1.24)
Glucose, Bld: 111 mg/dL — ABNORMAL HIGH (ref 70–99)
POTASSIUM: 5.1 mmol/L (ref 3.5–5.1)
Sodium: 139 mmol/L (ref 135–145)
TOTAL PROTEIN: 5.5 g/dL — AB (ref 6.5–8.1)

## 2018-02-20 LAB — CBC WITH DIFFERENTIAL/PLATELET
BASOS PCT: 0 %
Basophils Absolute: 0 10*3/uL (ref 0.0–0.1)
Eosinophils Absolute: 0.1 10*3/uL (ref 0.0–0.7)
Eosinophils Relative: 1 %
HEMATOCRIT: 28.4 % — AB (ref 39.0–52.0)
Hemoglobin: 9 g/dL — ABNORMAL LOW (ref 13.0–17.0)
LYMPHS PCT: 11 %
Lymphs Abs: 1.7 10*3/uL (ref 0.7–4.0)
MCH: 26.8 pg (ref 26.0–34.0)
MCHC: 31.7 g/dL (ref 30.0–36.0)
MCV: 84.5 fL (ref 78.0–100.0)
MONO ABS: 1 10*3/uL (ref 0.1–1.0)
MONOS PCT: 6 %
NEUTROS ABS: 13.4 10*3/uL — AB (ref 1.7–7.7)
Neutrophils Relative %: 82 %
Platelets: 281 10*3/uL (ref 150–400)
RBC: 3.36 MIL/uL — ABNORMAL LOW (ref 4.22–5.81)
RDW: 18.3 % — AB (ref 11.5–15.5)
WBC: 16.3 10*3/uL — ABNORMAL HIGH (ref 4.0–10.5)

## 2018-02-20 LAB — LEGIONELLA PNEUMOPHILA SEROGP 1 UR AG: L. PNEUMOPHILA SEROGP 1 UR AG: NEGATIVE

## 2018-02-20 NOTE — Progress Notes (Signed)
  Speech Language Pathology Treatment: Dysphagia  Patient Details Name: Barry Curry MRN: [id number] DOB: August 21, 1940 Today's Date: 02/20/2018 Time: 1550-1601 SLP Time Calculation (min) (ACUTE ONLY): 11 min  Assessment / Plan / Recommendation Clinical Impression  F/u after Friday's swallow assessment.  Pt describes unusual incident with choking on a pill, but affirms this was atypical.  He and daughters report that he has been eating fine subsequently, despite having limited appetite.  No overt s/s of aspiration or oropharyngeal difficulty upon my observation of pt drinking liquids. RR was well under the threshold for concern for swallow/respiratory asynchrony.  We reviewed basic esophageal precautions and discussed the cycle of breathing/swallowing, including the value of taking breaks and not rushing through meals when he is tachypneic.  No further SLP f/u warranted.  Pt/family verbalize understanding.  Our services will respectfully sign off.    HPI HPI: 77 year old male admitted 02/16/18 with DOE, SOB, cough and general malaise.  PMH: Anemia, Anxiety, Aortic stenosis, Arthritis, Cancer ([place]), Cancer of kidney ([place]), Cardiomyopathy ([place]), Clear cell carcinoma of kidney, left ([place]) (8/46/6599), Complication of anesthesia, Coronary artery disease, Depression, Dyspnea, Esophageal stricture, Goals of care, counseling/discussion (11/17/2017), Heart murmur, History of atrial fibrillation, History of claustrophobia, Hyperlipemia, Hypertension, Left kidney mass, Lower extremity edema, Mitral stenosis, PVC (premature ventricular contraction), and RBBB (right bundle branch block with left anterior fascicular block), esophagus mildly distended, ? Necrotic right lower lobe. CXR = right lung infiltrate suggesting PNA. RN reports pt became unresponsive following administration of medication yesterday.      SLP Plan  All goals met       Recommendations  Diet recommendations: Regular;Thin liquid Liquids  provided via: Cup;Straw Medication Administration: Whole meds with liquid Supervision: Patient able to self feed                Oral Care Recommendations: Oral care BID Follow up Recommendations: None SLP Visit Diagnosis: Dysphagia, unspecified (R13.10) Plan: All goals met       GO                Juan Quam Laurice 02/20/2018, 4:03 PM

## 2018-02-20 NOTE — Progress Notes (Signed)
Pharmacy Antibiotic Note  Barry Curry is a 77 y.o. male with DOE, SOB, cough and malaise admitted on 02/16/2018 with pneumonia.  Pharmacy consulted for cefepime dosing, now day 4 of treatment.  Renal function remains stable, SCr 1.13 and CrCl ~ 59 mL/min.    Plan: Cefepime 1 Gm IV q8h F/u clinical progression and LOT  Height: 6' (182.9 cm) Weight: 193 lb 5.5 oz (87.7 kg) IBW/kg (Calculated) : 77.6  Temp (24hrs), Avg:98.1 F (36.7 C), Min:97.8 F (36.6 C), Max:98.3 F (36.8 C)  Recent Labs  Lab 02/16/18 1652 02/16/18 1701 02/16/18 1955 02/17/18 0331 02/18/18 0928 02/19/18 0348 02/20/18 0354  WBC 19.5*  --   --  16.5* 15.2* 16.1* 16.3*  CREATININE 1.32*  --   --  1.29* 1.17 1.25* 1.13  LATICACIDVEN  --  2.03* 1.39  --   --   --   --     Estimated Creatinine Clearance: 59.1 mL/min (by C-G formula based on SCr of 1.13 mg/dL).    Allergies  Allergen Reactions  . Penicillins Rash    Has patient had a PCN reaction causing immediate rash, facial/tongue/throat swelling, SOB or lightheadedness with hypotension: No Has patient had a PCN reaction causing severe rash involving mucus membranes or skin necrosis: No Has patient had a PCN reaction that required hospitalization: No Has patient had a PCN reaction occurring within the last 10 years: No If all of the above answers are "NO", then may proceed with Cephalosporin use.     Antimicrobials this admission: 8/21 azactam >> x1 ED 8/22 cefepime >>  8/22 vancomycin >>8/24  Dose adjustments this admission:   Microbiology results:  8/21 BCx: NGTD 8/21 Legionella antigen: ordered 8/21 Strep pneumo antigen: neg 8/21 Sputum: ordered  8/21 MRSA PCR: negative   Bertis Ruddy, PharmD Clinical Pharmacist Please check AMION for all [place] numbers 02/20/2018 8:22 AM

## 2018-02-20 NOTE — Progress Notes (Signed)
PROGRESS NOTE    Barry Curry  JAS:[id number] DOB: 06-10-1941 DOA: 02/16/2018 PCP: [name], PA-C   Brief Narrative: Barry Curry is a 77 y.o. 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 Presented secondary to shortness of breath and found to have a RLL pneumonia.   Assessment & Plan:   Principal Problem:   Healthcare-associated pneumonia Active Problems:   PAF (paroxysmal atrial fibrillation) (HCC)   Clear cell carcinoma of kidney, left (HCC)   CAD (coronary artery disease)   CKD (chronic kidney disease), stage III (HCC)   Pressure injury of skin   Protein-calorie malnutrition, severe   Right lower lobe pneumonia HCAP treatment. Afebrile currently. On antibiotics. Urine strep negative. Blood cultures no growth to date. Continues to have tachypnea to mid-high 30s -Continue Cefepime -Sputum culture, urine legionella antigen -Blood cultures -Repeat CT chest today  Acute respiratory failure with hypoxia Resolved. Possible aspiration episode. Patient also received IV iron around the same time of his unresponsive episode. Resolved. -SLP eval: regular diet   Paroxysmal atrial fibrillation Patient currently in sinus rhythm. On dronedarone and metoprolol as an outpatient. Patient with tachycardia but regular rhythm -Continue oral metoprolol and dronedarone -EKG  CAD -Continue metoprolol  CKD stage III Stable  Metastatic renal cell carcinoma Seen by oncologist. Patient with significant chest burden.   DVT prophylaxis: Heparin subq Code Status:   Code Status: DNR Family Communication: Daughter at bedside Disposition Plan: Keep in stepdown secondary to tachypnea. Discharge pending medical improvement.   Consultants:   CCM  Procedures:    None  Antimicrobials:  Levaquin  Vancomycin  Cefepime    Subjective: Worsened dyspnea today.  Objective: Vitals:   02/19/18 2014 02/19/18 2342 02/20/18 0402 02/20/18 0800  BP:    101/61  Pulse:      Resp:      Temp: 98.2 F (36.8 C) 98.3 F (36.8 C) 97.9 F (36.6 C) (!) 97.4 F (36.3 C)  TempSrc: Oral Oral Oral Oral  SpO2:      Weight:      Height:        Intake/Output Summary (Last 24 hours) at 02/20/2018 0956 Last data filed at 02/19/2018 1800 Gross per 24 hour  Intake 148.52 ml  Output -  Net 148.52 ml   Filed Weights   02/16/18 1632 02/18/18 0430  Weight: 86 kg 87.7 kg    Examination:  General exam: Appears calm and comfortable Respiratory system: Clear to auscultation on left with diminished breath sound in RLL Respiratory effort increased with accessory muscle usage. Cardiovascular system: S1 & S2 heard, RRR. No murmurs. Gastrointestinal system: Abdomen is nondistended, soft and nontender. No organomegaly or masses felt. Normal bowel sounds heard. Central nervous system: Alert and oriented. No focal neurological deficits. Extremities: No edema. No calf tenderness Skin: No cyanosis. No rashes Psychiatry: Judgement and insight appear normal. Mood & affect appropriate.    Data Reviewed: I have personally reviewed following labs and imaging studies  CBC: Recent Labs  Lab 02/16/18 1652 02/17/18 0331 02/18/18 0928 02/19/18 0348 02/20/18 0354  WBC 19.5* 16.5* 15.2* 16.1* 16.3*  NEUTROABS 16.6* 13.6* 12.8* 13.2* 13.4*  HGB 10.0* 9.5* 9.5* 9.3* 9.0*  HCT 30.6* 29.8* 30.5* 29.4* 28.4*  MCV 82.0 83.5 85.7 85.0 84.5  PLT 346 343 309 290 281  Basic Metabolic Panel: Recent Labs  Lab 02/16/18 1652 02/17/18 0331 02/18/18 0928 02/19/18 0348 02/20/18 0354  NA 135 138 140 138 139  K 5.4* 5.3* 5.2* 4.9 5.1  CL 104 106 109 110 108  CO2 22 25 20* 24 25  GLUCOSE 91 81 59* 118* 111*  BUN 30* 26* 27* 30* 28*  CREATININE 1.32* 1.29* 1.17 1.25* 1.13   CALCIUM 8.6* 8.6* 8.6* 8.3* 8.4*  MG  --  2.1  --   --   --    GFR: Estimated Creatinine Clearance: 59.1 mL/min (by C-G formula based on SCr of 1.13 mg/dL). Liver Function Tests: Recent Labs  Lab 02/16/18 1458 02/16/18 1652 02/18/18 0928 02/19/18 0348 02/20/18 0354  AST 32 35 19 31 27   ALT 31 28 19 25 24   ALKPHOS 92* 84 68 67 67  BILITOT 0.6 0.7 0.8 0.6 0.4  PROT 6.6 6.5 5.7* 5.4* 5.5*  ALBUMIN 2.7* 2.6* 2.3* 2.0* 2.0*   No results for input(s): LIPASE, AMYLASE in the last 168 hours. No results for input(s): AMMONIA in the last 168 hours. Coagulation Profile: No results for input(s): INR, PROTIME in the last 168 hours. Cardiac Enzymes: No results for input(s): CKTOTAL, CKMB, CKMBINDEX, TROPONINI in the last 168 hours. BNP (last 3 results) No results for input(s): PROBNP in the last 8760 hours. HbA1C: No results for input(s): HGBA1C in the last 72 hours. CBG: Recent Labs  Lab 02/17/18 0925 02/19/18 0810  GLUCAP 84 97   Lipid Profile: No results for input(s): CHOL, HDL, LDLCALC, TRIG, CHOLHDL, LDLDIRECT in the last 72 hours. Thyroid Function Tests: No results for input(s): TSH, T4TOTAL, FREET4, T3FREE, THYROIDAB in the last 72 hours. Anemia Panel: No results for input(s): VITAMINB12, FOLATE, FERRITIN, TIBC, IRON, RETICCTPCT in the last 72 hours. Sepsis Labs: Recent Labs  Lab 02/16/18 1701 02/16/18 1955  LATICACIDVEN 2.03* 1.39    Recent Results (from the past 240 hour(s))  Blood Culture (routine x 2)     Status: None (Preliminary result)   Collection Time: 02/16/18  5:20 PM  Result Value Ref Range Status   Specimen Description   Final    BLOOD BLOOD LEFT ARM Performed at [facility], [address] [place], [place] [address]    Special Requests   Final    BOTTLES DRAWN AEROBIC AND ANAEROBIC Blood Culture adequate volume Performed at [facility], [address] [place], [place] [address]    Culture   Final    NO GROWTH 3  DAYS Performed at [facility], [place] [address] [place], [place] [address]    Report Status PENDING  Incomplete  MRSA PCR Screening     Status: None   Collection Time: 02/16/18 11:03 PM  Result Value Ref Range Status   MRSA by PCR NEGATIVE NEGATIVE Final    Comment:        The GeneXpert MRSA Assay (FDA approved for NASAL specimens only), is one component of a comprehensive MRSA colonization surveillance program. It is not intended to diagnose MRSA infection nor to guide or monitor treatment for MRSA infections. Performed at [facility], [place] [address] [place], [place] [address]   Blood Culture (routine x 2)     Status: None (Preliminary result)   Collection Time: 02/16/18 11:31 PM  Result Value Ref Range Status   Specimen Description   Final    BLOOD RIGHT ANTECUBITAL Performed at [place] [address]  [place], [place] [address]    Special Requests   Final    BOTTLES DRAWN AEROBIC AND ANAEROBIC Blood Culture adequate volume Performed at [facility] [address] [place], [place] [address]    Culture   Final    NO GROWTH 2 DAYS Performed at [facility] [address], [place], [place] [address]    Report Status PENDING  Incomplete         Radiology Studies: No results found.      Scheduled Meds: . dronedarone  200 mg Oral BID WC  . feeding supplement (ENSURE ENLIVE)  237 mL Oral TID WC  . heparin  5,000 Units Subcutaneous Q8H  . lipase/protease/amylase  72,000 Units Oral BID AC  . mouth rinse  15 mL Mouth Rinse BID  . megestrol  400 mg Oral BID  . Melatonin  3 mg Oral QHS  . metoprolol tartrate  12.5 mg Oral BID  . pantoprazole  40 mg Oral Daily  . vitamin B-6  25 mg Oral BID  . sodium chloride flush  3 mL Intravenous Q12H  . tamsulosin  0.4 mg Oral QPC supper   Continuous Infusions: . sodium chloride Stopped (02/19/18 1752)  . ceFEPime (MAXIPIME) IV Stopped (02/20/18  0429)     LOS: 4 days     Cordelia Poche, MD [facility] 02/20/2018, 9:56 AM Pager: [phone]  If 7PM-7AM, please contact night-coverage [email or web address] 02/20/2018, 9:56 AM

## 2018-02-21 ENCOUNTER — Other Ambulatory Visit: Payer: Self-pay | Admitting: Surgery

## 2018-02-21 DIAGNOSIS — J90 Pleural effusion, not elsewhere classified: Secondary | ICD-10-CM

## 2018-02-21 LAB — CBC WITH DIFFERENTIAL/PLATELET
BASOS ABS: 0 10*3/uL (ref 0.0–0.1)
Basophils Relative: 0 %
EOS ABS: 0.1 10*3/uL (ref 0.0–0.7)
EOS PCT: 0 %
HCT: 29 % — ABNORMAL LOW (ref 39.0–52.0)
Hemoglobin: 9.1 g/dL — ABNORMAL LOW (ref 13.0–17.0)
LYMPHS PCT: 10 %
Lymphs Abs: 2 10*3/uL (ref 0.7–4.0)
MCH: 26.6 pg (ref 26.0–34.0)
MCHC: 31.4 g/dL (ref 30.0–36.0)
MCV: 84.8 fL (ref 78.0–100.0)
MONO ABS: 1.3 10*3/uL — AB (ref 0.1–1.0)
Monocytes Relative: 7 %
Neutro Abs: 16.2 10*3/uL — ABNORMAL HIGH (ref 1.7–7.7)
Neutrophils Relative %: 83 %
PLATELETS: 303 10*3/uL (ref 150–400)
RBC: 3.42 MIL/uL — ABNORMAL LOW (ref 4.22–5.81)
RDW: 18.3 % — AB (ref 11.5–15.5)
WBC: 19.5 10*3/uL — ABNORMAL HIGH (ref 4.0–10.5)

## 2018-02-21 LAB — COMPREHENSIVE METABOLIC PANEL
ALT: 25 U/L (ref 0–44)
AST: 40 U/L (ref 15–41)
Albumin: 1.9 g/dL — ABNORMAL LOW (ref 3.5–5.0)
Alkaline Phosphatase: 65 U/L (ref 38–126)
Anion gap: 6 (ref 5–15)
BUN: 25 mg/dL — AB (ref 8–23)
CHLORIDE: 108 mmol/L (ref 98–111)
CO2: 26 mmol/L (ref 22–32)
Calcium: 8.6 mg/dL — ABNORMAL LOW (ref 8.9–10.3)
Creatinine, Ser: 1.04 mg/dL (ref 0.61–1.24)
Glucose, Bld: 104 mg/dL — ABNORMAL HIGH (ref 70–99)
Potassium: 5.2 mmol/L — ABNORMAL HIGH (ref 3.5–5.1)
SODIUM: 140 mmol/L (ref 135–145)
Total Bilirubin: 0.5 mg/dL (ref 0.3–1.2)
Total Protein: 5.4 g/dL — ABNORMAL LOW (ref 6.5–8.1)

## 2018-02-21 LAB — CULTURE, BLOOD (ROUTINE X 2)
CULTURE: NO GROWTH
SPECIAL REQUESTS: ADEQUATE

## 2018-02-21 LAB — PREPARE RBC (CROSSMATCH)

## 2018-02-21 MED ORDER — HYDROCOD POLST-CPM POLST ER 10-8 MG/5ML PO SUER
5.0000 mL | Freq: Two times a day (BID) | ORAL | Status: DC
  Administered 2018-02-21: 5 mL via ORAL
  Filled 2018-02-21: qty 5

## 2018-02-21 MED ORDER — IPRATROPIUM-ALBUTEROL 0.5-2.5 (3) MG/3ML IN SOLN
3.0000 mL | Freq: Four times a day (QID) | RESPIRATORY_TRACT | Status: DC
  Administered 2018-02-21 (×3): 3 mL via RESPIRATORY_TRACT
  Filled 2018-02-21 (×4): qty 3

## 2018-02-21 MED ORDER — SODIUM POLYSTYRENE SULFONATE 15 GM/60ML PO SUSP
15.0000 g | Freq: Once | ORAL | Status: AC
  Administered 2018-02-21: 15 g via ORAL
  Filled 2018-02-21: qty 60

## 2018-02-21 MED ORDER — HYDROCODONE-ACETAMINOPHEN 5-325 MG PO TABS
2.0000 | ORAL_TABLET | Freq: Once | ORAL | Status: AC
  Administered 2018-02-21: 2 via ORAL
  Filled 2018-02-21: qty 2

## 2018-02-21 MED ORDER — GUAIFENESIN ER 600 MG PO TB12
1200.0000 mg | ORAL_TABLET | Freq: Two times a day (BID) | ORAL | Status: DC
  Administered 2018-02-21 – 2018-02-23 (×5): 1200 mg via ORAL
  Filled 2018-02-21 (×5): qty 2

## 2018-02-21 MED ORDER — IPRATROPIUM-ALBUTEROL 0.5-2.5 (3) MG/3ML IN SOLN
3.0000 mL | Freq: Three times a day (TID) | RESPIRATORY_TRACT | Status: DC
  Administered 2018-02-22 – 2018-02-23 (×3): 3 mL via RESPIRATORY_TRACT
  Filled 2018-02-21 (×4): qty 3

## 2018-02-21 MED ORDER — HYDROCODONE-HOMATROPINE 5-1.5 MG/5ML PO SYRP: 5.0000 mL | ORAL_SOLUTION | Freq: Four times a day (QID) | ORAL | Status: DC | PRN

## 2018-02-21 MED ORDER — FUROSEMIDE 10 MG/ML IJ SOLN
40.0000 mg | Freq: Once | INTRAMUSCULAR | Status: AC
  Administered 2018-02-21: 40 mg via INTRAVENOUS
  Filled 2018-02-21: qty 4

## 2018-02-21 MED ORDER — MORPHINE SULFATE (PF) 2 MG/ML IV SOLN
1.0000 mg | INTRAVENOUS | Status: DC | PRN
  Administered 2018-02-21: 1 mg via INTRAVENOUS
  Filled 2018-02-21 (×2): qty 1

## 2018-02-21 MED ORDER — METOPROLOL TARTRATE 5 MG/5ML IV SOLN
2.5000 mg | Freq: Four times a day (QID) | INTRAVENOUS | Status: DC
  Administered 2018-02-21 – 2018-02-23 (×8): 2.5 mg via INTRAVENOUS
  Filled 2018-02-21 (×7): qty 5

## 2018-02-21 MED ORDER — SODIUM CHLORIDE 0.9% IV SOLUTION: Freq: Once | INTRAVENOUS | Status: DC

## 2018-02-21 NOTE — Progress Notes (Addendum)
PROGRESS NOTE    Barry Curry  CBJ:[id number] DOB: 01-28-1941 DOA: 02/16/2018 PCP: [name], PA-C   Brief Narrative: Barry Curry is a 77 y.o. 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 Presented secondary to shortness of breath and found to have a RLL pneumonia.   Assessment & Plan:   Principal Problem:   Healthcare-associated pneumonia Active Problems:   PAF (paroxysmal atrial fibrillation) (HCC)   Clear cell carcinoma of kidney, left (HCC)   CAD (coronary artery disease)   CKD (chronic kidney disease), stage III (HCC)   Pressure injury of skin   Protein-calorie malnutrition, severe   Right lower lobe pneumonia HCAP treatment. Afebrile currently. On antibiotics. Urine strep negative. Blood cultures no growth to date. Tachpynea slightly better. Respiratory effort slightly improved. -Continue Cefepime -Sputum culture, urine legionella antigen -Blood cultures -Mucinex -Hycodan prn  Acute respiratory failure with hypoxia Resolved. Possible aspiration episode. Patient also received IV iron around the same time of his unresponsive episode. Resolved. -SLP eval: regular diet   Paroxysmal atrial fibrillation Patient currently in sinus rhythm. On dronedarone and metoprolol as an outpatient. Continued tachycardia. -Continue oral metoprolol and dronedarone  CAD -Continue metoprolol  CKD stage III Stable  Metastatic renal cell carcinoma Seen by oncologist. Patient with significant chest burden. Per oncology discussion with patient, plan for hospice.  Hyperkalemia Kayexalate   Anemia Chronic. Currently stable at patient's baseline of 9. Transfusion ordered by oncology.  Pressure ulcer Stage 2 left buttock.   DVT prophylaxis: Heparin  subq Code Status:   Code Status: DNR Family Communication: Daughter at bedside Disposition Plan: Discharge to home/residential hospice   Consultants:   CCM  Procedures:   None  Antimicrobials:  Levaquin  Vancomycin  Cefepime    Subjective: Coughing is an issue for him. Improved with sitting up.  Objective: Vitals:   02/20/18 2329 02/21/18 0409 02/21/18 0459 02/21/18 0508  BP:   (!) 103/50 (!) 103/50  Pulse:   (!) 105   Resp:   (!) 22   Temp: 99.8 F (37.7 C) (!) 97.5 F (36.4 C)    TempSrc: Oral Oral    SpO2:   96%   Weight:      Height:        Intake/Output Summary (Last 24 hours) at 02/21/2018 0827 Last data filed at 02/21/2018 0529 Gross per 24 hour  Intake 228.2 ml  Output 200 ml  Net 28.2 ml   Filed Weights   02/16/18 1632 02/18/18 0430  Weight: 86 kg 87.7 kg    Examination:  General exam: Appears calm and comfortable Respiratory system: Clear to auscultation on left with diminished breath sound in RLL Respiratory effort increased with accessory muscle usage. Cardiovascular system: S1 & S2 heard, RRR. No murmurs. Gastrointestinal system: Abdomen is nondistended, soft and nontender. No organomegaly or masses felt. Normal bowel sounds heard. Central nervous system: Alert and oriented. No focal neurological deficits. Extremities: No edema. No calf tenderness Skin: No cyanosis. No rashes Psychiatry: Judgement and insight appear normal. Mood & affect appropriate.    Data Reviewed: I have personally reviewed following labs and imaging studies  CBC: Recent Labs  Lab 02/17/18 0331 02/18/18 0928 02/19/18 0348 02/20/18 0354 02/21/18 0308  WBC 16.5* 15.2* 16.1* 16.3* 19.5*  NEUTROABS 13.6* 12.8* 13.2* 13.4* 16.2*  HGB 9.5*  9.5* 9.3* 9.0* 9.1*  HCT 29.8* 30.5* 29.4* 28.4* 29.0*  MCV 83.5 85.7 85.0 84.5 84.8  PLT 343 309 290 281 952   Basic Metabolic Panel: Recent Labs  Lab 02/17/18 0331 02/18/18 0928 02/19/18 0348 02/20/18 0354  02/21/18 0308  NA 138 140 138 139 140  K 5.3* 5.2* 4.9 5.1 5.2*  CL 106 109 110 108 108  CO2 25 20* 24 25 26   GLUCOSE 81 59* 118* 111* 104*  BUN 26* 27* 30* 28* 25*  CREATININE 1.29* 1.17 1.25* 1.13 1.04  CALCIUM 8.6* 8.6* 8.3* 8.4* 8.6*  MG 2.1  --   --   --   --    GFR: Estimated Creatinine Clearance: 64.3 mL/min (by C-G formula based on SCr of 1.04 mg/dL). Liver Function Tests: Recent Labs  Lab 02/16/18 1652 02/18/18 0928 02/19/18 0348 02/20/18 0354 02/21/18 0308  AST 35 19 31 27  40  ALT 28 19 25 24 25   ALKPHOS 84 68 67 67 65  BILITOT 0.7 0.8 0.6 0.4 0.5  PROT 6.5 5.7* 5.4* 5.5* 5.4*  ALBUMIN 2.6* 2.3* 2.0* 2.0* 1.9*   No results for input(s): LIPASE, AMYLASE in the last 168 hours. No results for input(s): AMMONIA in the last 168 hours. Coagulation Profile: No results for input(s): INR, PROTIME in the last 168 hours. Cardiac Enzymes: No results for input(s): CKTOTAL, CKMB, CKMBINDEX, TROPONINI in the last 168 hours. BNP (last 3 results) No results for input(s): PROBNP in the last 8760 hours. HbA1C: No results for input(s): HGBA1C in the last 72 hours. CBG: Recent Labs  Lab 02/17/18 0925 02/19/18 0810  GLUCAP 84 97   Lipid Profile: No results for input(s): CHOL, HDL, LDLCALC, TRIG, CHOLHDL, LDLDIRECT in the last 72 hours. Thyroid Function Tests: No results for input(s): TSH, T4TOTAL, FREET4, T3FREE, THYROIDAB in the last 72 hours. Anemia Panel: No results for input(s): VITAMINB12, FOLATE, FERRITIN, TIBC, IRON, RETICCTPCT in the last 72 hours. Sepsis Labs: Recent Labs  Lab 02/16/18 1701 02/16/18 1955  LATICACIDVEN 2.03* 1.39    Recent Results (from the past 240 hour(s))  Blood Culture (routine x 2)     Status: None (Preliminary result)   Collection Time: 02/16/18  5:20 PM  Result Value Ref Range Status   Specimen Description   Final    BLOOD BLOOD LEFT ARM Performed at [facility], [address] [place], [place] [address]    Special  Requests   Final    BOTTLES DRAWN AEROBIC AND ANAEROBIC Blood Culture adequate volume Performed at [facility], [address], [place], [place] [address]    Culture   Final    NO GROWTH 4 DAYS Performed at [facility], [place] [address], [place], [place] [address]    Report Status PENDING  Incomplete  MRSA PCR Screening     Status: None   Collection Time: 02/16/18 11:03 PM  Result Value Ref Range Status   MRSA by PCR NEGATIVE NEGATIVE Final    Comment:        The GeneXpert MRSA Assay (FDA approved for NASAL specimens only), is one component of a comprehensive MRSA colonization surveillance program. It is not intended to diagnose MRSA infection nor to guide or monitor treatment for MRSA infections. Performed at [facility], [place] [address], [place], [place] [address]   Blood Culture (routine x 2)     Status: None (Preliminary result)   Collection Time: 02/16/18 11:31 PM  Result  Value Ref Range Status   Specimen Description   Final    BLOOD RIGHT ANTECUBITAL Performed at [facility] [address], [place], [place] [address]    Special Requests   Final    BOTTLES DRAWN AEROBIC AND ANAEROBIC Blood Culture adequate volume Performed at [facility] [address], [place], [place] [address]    Culture   Final    NO GROWTH 3 DAYS Performed at [facility], [place] [address], [place], [place] [address]    Report Status PENDING  Incomplete         Radiology Studies: Ct Chest Wo Contrast  Result Date: 02/20/2018 CLINICAL DATA:  Metastatic renal cell carcinoma with biopsy-proven right pleural metastatic disease. Pneumonia. Dyspnea. Inpatient. EXAM: CT CHEST WITHOUT CONTRAST TECHNIQUE: Multidetector CT imaging of the chest was performed following the standard protocol without IV contrast. COMPARISON:  02/16/2018 chest CT angiogram. 02/17/2018 chest radiograph. FINDINGS: Cardiovascular: Normal heart  size. No significant pericardial effusion/thickening. Coronary atherosclerosis. Two lead left subclavian pacemaker is noted with lead tips in the right atrium and right ventricular apex. Atherosclerotic nonaneurysmal thoracic aorta. Prominently dilated main pulmonary artery (4.3 cm diameter), unchanged. Mediastinum/Nodes: No discrete thyroid nodules. Mildly patulous esophagus with debris level in the midthoracic esophagus. No pathologically enlarged axillary, mediastinal or hilar lymph nodes, noting limited sensitivity for the detection of hilar adenopathy on this noncontrast study. Stable coarsely calcified right hilar nodes from prior granulomatous disease. Lungs/Pleura: Minimal scattered right pleural gas has slightly decreased in the interval. No left pneumothorax. Extensive irregular masslike pleural thickening throughout the right pleural space including along the fissures with small loculated pleural effusion, measuring up to 3.3 cm thickness anteriorly (series 2/image 106), unchanged. Stable scattered curvilinear hyperdensities in the right pleural space. No left pleural effusion. New small nodular focus of consolidation in the right lower lobe (series 5/image 125). Prominent compressive atelectasis throughout the right lung, unchanged. Mild left lung base atelectasis, mildly increased. No additional significant pulmonary nodules. Upper abdomen: Simple 2.3 cm right liver lobe cyst. Cholelithiasis. Right adrenal 3.0 cm mass, not appreciably changed using similar measurement technique. Scattered granulomatous calcifications in the spleen and liver. Musculoskeletal: No aggressive appearing focal osseous lesions. Moderate thoracic spondylosis. Decreased subcutaneous emphysema in the lateral right chest wall. IMPRESSION: 1. 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 Scattered gas in the right pleural space is decreased. Prominent compressive atelectasis in the right lung. 2. New small nodular focus of consolidation in the right lower lobe, probably due to pneumonia or aspiration. Attention on follow-up chest CT. 3. Mildly patulous esophagus with debris level in the midthoracic esophagus. 4. Stable right adrenal metastasis. 5. Prominent main pulmonary artery dilatation, suggesting pulmonary arterial hypertension. Aortic Atherosclerosis (ICD10-I70.0). Electronically Signed   By: Ilona Sorrel M.D.   On: 02/20/2018 11:47        Scheduled Meds: . sodium chloride   Intravenous Once  . chlorpheniramine-HYDROcodone  5 mL Oral Q12H  . dronedarone  200 mg Oral BID WC  . feeding supplement (ENSURE ENLIVE)  237 mL Oral TID WC  . furosemide  40 mg Intravenous Once  . heparin  5,000 Units Subcutaneous Q8H  . ipratropium-albuterol  3 mL Nebulization Q6H  . lipase/protease/amylase  72,000 Units Oral BID AC  . mouth rinse  15 mL Mouth Rinse BID  . Melatonin  3 mg Oral QHS  . metoprolol tartrate  12.5 mg Oral BID  . pantoprazole  40 mg Oral Daily  . vitamin B-6  25 mg Oral BID  . sodium chloride flush  3 mL Intravenous Q12H  . sodium polystyrene  15 g Oral Once  . tamsulosin  0.4 mg Oral QPC supper   Continuous Infusions: . sodium chloride Stopped (02/20/18 1306)  . ceFEPime (MAXIPIME) IV Stopped (02/21/18 0529)     LOS: 5 days     Cordelia Poche, MD [facility] 02/21/2018, 8:27 AM Pager: [phone]  If 7PM-7AM, please contact night-coverage [email or web address] 02/21/2018, 8:27 AM

## 2018-02-21 NOTE — Progress Notes (Signed)
PT Cancellation Note  Patient Details Name: Barry Curry MRN: [id number] DOB: 1940/10/16   Cancelled Treatment:    Reason Eval/Treat Not Completed: Other (comment)Patient and fmily meeting with Hospice. Had run of SVT this am. Will check another time if PT needs are indicated for Dc.    Claretha Cooper 02/21/2018, 12:39 PM Tresa Endo PT [phone]

## 2018-02-21 NOTE — Progress Notes (Signed)
CSW received call from [facility] liaison in regards to [place] vs. Res[facility]. She states the patient daughter reached out to them via e-mail this morning requesting to talk with someone about HPOG services. Harmon Pier at [facility] agreeable to meet with the patient daughter at 12:30pm today to discuss patient plan of care.   Kathrin Greathouse, Marlinda Mike, MSW Clinical Social Worker  [phone] 02/21/2018  10:03 AM

## 2018-02-21 NOTE — Care Management Note (Signed)
Case Management Note  Patient Details  Name: VENNIE WAYMIRE MRN: [id number] Date of Birth: 1940-11-06  Subjective/Objective:                   77 y.o. malewith medical history significant forparoxysmal atrial fibrillation, coronary artery disease, chronic kidney disease stage III, and cancer of the left kidney status post nephrectomy in January with metastases to the right pleura status post VATS last month. Presented secondary to shortness of breath and found to have a RLL pneumonia.  Action/Plan: Palliative care following CSW following from [facility] CM following for needs and progression.  Expected Discharge Date:  (unknown)               Expected Discharge Plan:  Skilled Nursing Facility  In-House Referral:  Clinical Social Work  Discharge planning Services  CM Consult  Post Acute Care Choice:    Choice offered to:     DME Arranged:    DME Agency:     HH Arranged:    [place] Agency:     Status of Service:  In process, will continue to follow  If discussed at Long Length of Stay Meetings, dates discussed:    Additional Comments:  Leeroy Cha, RN 02/21/2018, 10:42 AM

## 2018-02-21 NOTE — Progress Notes (Addendum)
[facility])  Family contacted [facility] for interest in home with hospice verses [facility]. Met with daughter Ailene Ravel at noon today to explain services. She requested further discussion tomorrow when her sister Bryson Ha can be present.  Updated CSW Elmyra Ricks after visit today. Will update again tomorrow following visit.   Please do not hesitate to call with questions.  Thank you,  Erling Conte, LCSW [phone]

## 2018-02-21 NOTE — Progress Notes (Signed)
Palliative Medicine RN Note: Consult order noted for hospice order from Dr Marin Olp. SW notes indicate that family has already been in touch with [facility] and is meeting with them today.   There appears to be no role for PMT at this time. We will continue to monitor the chart in case family declines hospice services, but we will not see [place] unless new needs arise or her situation changes.  Marjie Skiff Alayshia Marini, RN, BSN, [facility] Palliative Medicine Team 02/21/2018 10:30 AM Office [phone]

## 2018-02-21 NOTE — Progress Notes (Signed)
Unfortunately, I really think that Mr. Barry Curry is just not going to improve that much.  I checked his pre-albumin and it was only 7.8.  I had a very long talk with he and his family this morning.  He says that he is "tired".  I know exactly what he means.  He does not want any treatment.  In fact, I do not think treatment would really be of any benefit for him at this point.  Our goal now is clearly quality of life.  He has been he how long I thought he had.  I told him that I would be surprised if he made it through September.  We really need to focus on his dignity, respect, and comfort.  In order to do this, hospice will be necessary.  I talked to he and his family about hospice.  I will call them and have the hospice liaison come over.  He wants to be able to go home.  Hopefully, his family can care for him at home.  I am not sure if they will be able to or not given his lung issues and breathing issues.  If not, then I believe that [facility] would be ideal for him.  I do think that a blood transfusion will help him.  He responded very nicely to a blood transfusion when he was at [facility] [place].  I believe that it will help him now.  Hemoglobin of 9.1 is just too low for him.  A transfusion would be helpful.  He agrees to this.  I also believe that he needs a nebulizer.  He has coughing.  He had a chest CT scan yesterday.  It showed the tumor along the right pleural space.  He may have pneumonia.  I just do not think this will ever improve.  Again, our goal here is just his quality of life.  Hospice will definitely help with this.  I will call them today and see how we can arrange for this.  He and his family will decide as to whether he can be taken care of at home or if he needs to go to [facility].  I know that he is getting fantastic care from everybody in the ICU.  We just need to change your priority and begin to look at his quality of life.  Lattie Haw, MD  2 Timothy 4:16-18

## 2018-02-22 DIAGNOSIS — R05 Cough: Secondary | ICD-10-CM

## 2018-02-22 LAB — CBC WITH DIFFERENTIAL/PLATELET
BAND NEUTROPHILS: 5 %
BASOS ABS: 0 10*3/uL (ref 0.0–0.1)
Basophils Relative: 0 %
Eosinophils Absolute: 0 10*3/uL (ref 0.0–0.7)
Eosinophils Relative: 0 %
HEMATOCRIT: 28.6 % — AB (ref 39.0–52.0)
HEMOGLOBIN: 9 g/dL — AB (ref 13.0–17.0)
LYMPHS ABS: 0.8 10*3/uL (ref 0.7–4.0)
Lymphocytes Relative: 4 %
MCH: 25.9 pg — ABNORMAL LOW (ref 26.0–34.0)
MCHC: 31.5 g/dL (ref 30.0–36.0)
MCV: 82.4 fL (ref 78.0–100.0)
METAMYELOCYTES PCT: 4 %
MONOS PCT: 4 %
Monocytes Absolute: 0.8 10*3/uL (ref 0.1–1.0)
Myelocytes: 1 %
Neutro Abs: 18.7 10*3/uL — ABNORMAL HIGH (ref 1.7–7.7)
Neutrophils Relative %: 82 %
Platelets: 264 10*3/uL (ref 150–400)
RBC: 3.47 MIL/uL — ABNORMAL LOW (ref 4.22–5.81)
RDW: 18.2 % — ABNORMAL HIGH (ref 11.5–15.5)
WBC: 20.3 10*3/uL — ABNORMAL HIGH (ref 4.0–10.5)

## 2018-02-22 LAB — CULTURE, BLOOD (ROUTINE X 2)
Culture: NO GROWTH
SPECIAL REQUESTS: ADEQUATE

## 2018-02-22 MED ORDER — HYDROCODONE-ACETAMINOPHEN 5-325 MG PO TABS
2.0000 | ORAL_TABLET | Freq: Four times a day (QID) | ORAL | Status: DC | PRN
  Administered 2018-02-22: 2 via ORAL
  Filled 2018-02-22: qty 2

## 2018-02-22 NOTE — Progress Notes (Signed)
Mr. Ducat is really about the same as yesterday.  I am still not sure as to whether or not he blood yesterday.  Apparently from what he says there was an issue with the transfusion.  I am not sure exactly what that means.  I do not see anything in the record about the transfusion.  His hemoglobin is no better.  As such, I suspect he did not get the blood.  He feels that his breathing might be doing a little bit better.  It sounds a lot better overall on his left side.  I do not hear congestion or wheezing.  He is on nebulizers.  His family will meet with hospice today.  He is not hurting.  He seems relatively comfortable.  He has had no fever.  He has had no much of an appetite.  He has had no diarrhea.  He did urinate quite a bit yesterday.  He did get some Lasix.  He has had no headache.  We will have to see what hospice says.  I know that the staff in the ICU of doing a fantastic job with him.  They have done all that they can do so far to try to help provide for his comfort and quality of life.  Lattie Haw, MD  Roman 5:3-5

## 2018-02-22 NOTE — Progress Notes (Signed)
PROGRESS NOTE    Barry Curry  HKV:[id number] DOB: 1941/03/27 DOA: 02/16/2018 PCP: [name], PA-C   Brief Narrative: Barry Curry is a 77 y.o. 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 Presented secondary to shortness of breath and found to have a RLL pneumonia.   Assessment & Plan:   Principal Problem:   Healthcare-associated pneumonia Active Problems:   PAF (paroxysmal atrial fibrillation) (HCC)   Clear cell carcinoma of kidney, left (HCC)   CAD (coronary artery disease)   CKD (chronic kidney disease), stage III (HCC)   Pressure injury of skin   Protein-calorie malnutrition, severe   Right lower lobe pneumonia HCAP treatment. Afebrile currently. On antibiotics. Urine strep negative. Blood cultures no growth to date. Still with tachypnea -Continue Cefepime -Blood cultures -Mucinex -Hycodan prn  Acute respiratory failure with hypoxia Resolved. Possible aspiration episode. Patient also received IV iron around the same time of his unresponsive episode. Resolved. -SLP eval: regular diet   Paroxysmal atrial fibrillation Patient currently in sinus rhythm. On dronedarone and metoprolol as an outpatient. Continued tachycardia. -Continue and dronedarone -Metoprolol IV  CAD -Continue metoprolol  CKD stage III Stable  Metastatic renal cell carcinoma Seen by oncologist. Patient with significant chest burden. Per oncology discussion with patient, plan for hospice.  Hyperkalemia S/p Kayexalate   Anemia Chronic. Currently stable at patient's baseline of 9. Transfusion ordered by oncology.  Pressure ulcer Stage 2 left buttock.   DVT prophylaxis: Heparin subq Code Status:   Code Status: DNR Family Communication: Daughter at  bedside Disposition Plan: Discharge to home/residential hospice pending hospice meeting   Consultants:   CCM  Procedures:   None  Antimicrobials:  Levaquin  Vancomycin  Cefepime    Subjective: Feels okay today. Slept well with Norco.  Objective: Vitals:   02/22/18 0830 02/22/18 0937 02/22/18 1140 02/22/18 1243  BP: (!) 90/53   119/62  Pulse: (!) 107   (!) 118  Resp: (!) 35   (!) 29  Temp:   97.9 F (36.6 C)   TempSrc:   Oral   SpO2: 95% 96%  96%  Weight:      Height:        Intake/Output Summary (Last 24 hours) at 02/22/2018 1323 Last data filed at 02/22/2018 0815 Gross per 24 hour  Intake 196.94 ml  Output 790 ml  Net -593.06 ml   Filed Weights   02/16/18 1632 02/18/18 0430 02/22/18 0351  Weight: 86 kg 87.7 kg 87.7 kg    Examination:  General exam: Appears calm and comfortable Respiratory system: Diminished breath sounds, tachypnea, mild accessory muscle usage Cardiovascular system: S1 & S2 heard, RRR. No murmurs. Gastrointestinal system: Abdomen is nondistended, soft and nontender. Normal bowel sounds heard. Central nervous system: Alert and oriented. No focal neurological deficits. Extremities: No edema. No calf tenderness Skin: No cyanosis. No rashes Psychiatry: Judgement and insight appear normal. Mood & affect appropriate.    Data Reviewed: I have personally reviewed following labs and imaging studies  CBC: Recent Labs  Lab 02/18/18 0928 02/19/18 0348 02/20/18 0354 02/21/18 0308 02/22/18 0255  WBC 15.2* 16.1* 16.3* 19.5* 20.3*  NEUTROABS 12.8* 13.2* 13.4* 16.2* 18.7*  HGB 9.5* 9.3* 9.0* 9.1* 9.0*  HCT 30.5* 29.4* 28.4* 29.0* 28.6*  MCV 85.7 85.0 84.5 84.8 82.4  PLT 309 290  281 303 798   Basic Metabolic Panel: Recent Labs  Lab 02/17/18 0331 02/18/18 0928 02/19/18 0348 02/20/18 0354 02/21/18 0308  NA 138 140 138 139 140  K 5.3* 5.2* 4.9 5.1 5.2*  CL 106 109 110 108 108  CO2 25 20* 24 25 26   GLUCOSE 81 59* 118* 111* 104*   BUN 26* 27* 30* 28* 25*  CREATININE 1.29* 1.17 1.25* 1.13 1.04  CALCIUM 8.6* 8.6* 8.3* 8.4* 8.6*  MG 2.1  --   --   --   --    GFR: Estimated Creatinine Clearance: 64.3 mL/min (by C-G formula based on SCr of 1.04 mg/dL). Liver Function Tests: Recent Labs  Lab 02/16/18 1652 02/18/18 0928 02/19/18 0348 02/20/18 0354 02/21/18 0308  AST 35 19 31 27  40  ALT 28 19 25 24 25   ALKPHOS 84 68 67 67 65  BILITOT 0.7 0.8 0.6 0.4 0.5  PROT 6.5 5.7* 5.4* 5.5* 5.4*  ALBUMIN 2.6* 2.3* 2.0* 2.0* 1.9*   No results for input(s): LIPASE, AMYLASE in the last 168 hours. No results for input(s): AMMONIA in the last 168 hours. Coagulation Profile: No results for input(s): INR, PROTIME in the last 168 hours. Cardiac Enzymes: No results for input(s): CKTOTAL, CKMB, CKMBINDEX, TROPONINI in the last 168 hours. BNP (last 3 results) No results for input(s): PROBNP in the last 8760 hours. HbA1C: No results for input(s): HGBA1C in the last 72 hours. CBG: Recent Labs  Lab 02/17/18 0925 02/19/18 0810  GLUCAP 84 97   Lipid Profile: No results for input(s): CHOL, HDL, LDLCALC, TRIG, CHOLHDL, LDLDIRECT in the last 72 hours. Thyroid Function Tests: No results for input(s): TSH, T4TOTAL, FREET4, T3FREE, THYROIDAB in the last 72 hours. Anemia Panel: No results for input(s): VITAMINB12, FOLATE, FERRITIN, TIBC, IRON, RETICCTPCT in the last 72 hours. Sepsis Labs: Recent Labs  Lab 02/16/18 1701 02/16/18 1955  LATICACIDVEN 2.03* 1.39    Recent Results (from the past 240 hour(s))  Blood Culture (routine x 2)     Status: None   Collection Time: 02/16/18  5:20 PM  Result Value Ref Range Status   Specimen Description   Final    BLOOD BLOOD LEFT ARM Performed at [facility], [address] [place], [place] [address]    Special Requests   Final    BOTTLES DRAWN AEROBIC AND ANAEROBIC Blood Culture adequate volume Performed at [facility], [address], [place], [place]  [address]    Culture   Final    NO GROWTH 5 DAYS Performed at [facility], [place] [address], [place], [place] [address]    Report Status 02/21/2018 FINAL  Final  MRSA PCR Screening     Status: None   Collection Time: 02/16/18 11:03 PM  Result Value Ref Range Status   MRSA by PCR NEGATIVE NEGATIVE Final    Comment:        The GeneXpert MRSA Assay (FDA approved for NASAL specimens only), is one component of a comprehensive MRSA colonization surveillance program. It is not intended to diagnose MRSA infection nor to guide or monitor treatment for MRSA infections. Performed at [facility], [place] [address] [place], [place] [address]   Blood Culture (routine x 2)     Status: None   Collection Time: 02/16/18 11:31 PM  Result Value Ref Range Status   Specimen Description   Final    BLOOD RIGHT ANTECUBITAL Performed at [facility]  [address], [place], [place] [address]    Special Requests   Final    BOTTLES DRAWN AEROBIC AND ANAEROBIC Blood Culture adequate volume Performed at [facility] [address], [place], [place] [address]    Culture   Final    NO GROWTH 5 DAYS Performed at [facility], [place] [address], [place], [place] [address]    Report Status 02/22/2018 FINAL  Final         Radiology Studies: No results found.      Scheduled Meds: . sodium chloride   Intravenous Once  . dronedarone  200 mg Oral BID WC  . feeding supplement (ENSURE ENLIVE)  237 mL Oral TID WC  . guaiFENesin  1,200 mg Oral BID  . heparin  5,000 Units Subcutaneous Q8H  . ipratropium-albuterol  3 mL Nebulization TID  . lipase/protease/amylase  72,000 Units Oral BID AC  . mouth rinse  15 mL Mouth Rinse BID  . Melatonin  3 mg Oral QHS  . metoprolol tartrate  2.5 mg Intravenous Q6H  . pantoprazole  40 mg Oral Daily  . vitamin B-6  25 mg Oral BID  . sodium chloride flush  3 mL Intravenous Q12H  . tamsulosin  0.4 mg  Oral QPC supper   Continuous Infusions: . sodium chloride Stopped (02/20/18 1306)  . ceFEPime (MAXIPIME) IV 1 g (02/22/18 1251)     LOS: 6 days     Cordelia Poche, MD [facility] 02/22/2018, 1:23 PM Pager: [phone]  If 7PM-7AM, please contact night-coverage [email or web address] 02/22/2018, 1:23 PM

## 2018-02-22 NOTE — Care Management Note (Signed)
Case Management Note  Patient Details  Name: ANTOIN DARGIS MRN: [id number] Date of Birth: 1940/08/21  Subjective/Objective:                  Family meeting today with hospice to discuss goals of care.  Action/Plan: Following for progression of care/some improvement in breathing today Cm needs-will follow for outcome of the family meeting for home hospice versus residential.  Expected Discharge Date:  (unknown)               Expected Discharge Plan:  Skilled Nursing Facility  In-House Referral:  Clinical Social Work  Discharge planning Services  CM Consult  Post Acute Care Choice:    Choice offered to:     DME Arranged:    DME Agency:     HH Arranged:    [place] Agency:     Status of Service:  In process, will continue to follow  If discussed at Long Length of Stay Meetings, dates discussed:    Additional Comments:  Leeroy Cha, RN 02/22/2018, 9:41 AM

## 2018-02-22 NOTE — Progress Notes (Signed)
[facility])  Met with patient's two daughters earlier today to discuss home hospice services vs [facility]. Both daughters confirmed desire for [facility]. Unfortunately [facility] does not have room to offer today. Will follow up with daughter tomorrow re availability.   Thank you,  Erling Conte, LCSW [phone]

## 2018-02-23 ENCOUNTER — Ambulatory Visit: Payer: Self-pay | Admitting: Surgery

## 2018-02-23 MED ORDER — DIPHENHYDRAMINE HCL 50 MG PO CAPS
50.0000 mg | ORAL_CAPSULE | Freq: Once | ORAL | Status: AC
  Administered 2018-02-23: 50 mg via ORAL
  Filled 2018-02-23: qty 1

## 2018-02-23 MED ORDER — ALBUTEROL SULFATE (2.5 MG/3ML) 0.083% IN NEBU: 2.5000 mg | INHALATION_SOLUTION | RESPIRATORY_TRACT | 12 refills | Status: AC | PRN

## 2018-02-23 MED ORDER — IPRATROPIUM-ALBUTEROL 0.5-2.5 (3) MG/3ML IN SOLN: 3.0000 mL | Freq: Two times a day (BID) | RESPIRATORY_TRACT | Status: DC

## 2018-02-23 MED ORDER — ALBUTEROL SULFATE (2.5 MG/3ML) 0.083% IN NEBU: 2.5000 mg | INHALATION_SOLUTION | RESPIRATORY_TRACT | Status: DC | PRN

## 2018-02-23 NOTE — Progress Notes (Signed)
Per SW, transport is being arranged at this time. Family made aware. MD paged in regards to signing DNR order form. IV to stay in per orders.

## 2018-02-23 NOTE — Progress Notes (Signed)
Patient has bed at [facility].   Patient will transport by [facility].   Facility printed [employer].   Packet on patients chart.   RN report number: [id number]  [place]

## 2018-02-23 NOTE — Progress Notes (Signed)
[facility] does not have any available beds at time.  Patient daughter Ailene Ravel states she prefers the patient go to a different facility because she "feels the patient is ready." CSW contacted [facility].Liaison will meet with the patient daughter this morning.  CSW will continue assist with patient discharge needs.   Patient will need DNR signed. CSW left form on patient chart for the physician to sign.   Kathrin Greathouse, Marlinda Mike, MSW Clinical Social Worker  [phone] 02/23/2018  10:24 AM

## 2018-02-23 NOTE — Care Management Note (Signed)
Case Management Note  Patient Details  Name: Barry Curry MRN: [id number] Date of Birth: 05/05/1941  Subjective/Objective:                  transitioning to comfort care plan is for admission to [facility] when bed avaialble  Action/Plan: Following for needs.  Expected Discharge Date:  (unknown)               Expected Discharge Plan:  [place]  In-House Referral:  Clinical Social Work  Discharge planning Services  CM Consult  Post Acute Care Choice:    Choice offered to:     DME Arranged:    DME Agency:     HH Arranged:    [place] Agency:     Status of Service:  Completed, signed off  If discussed at [employer], dates discussed:    Additional Comments:  Leeroy Cha, RN 02/23/2018, 10:13 AM

## 2018-02-23 NOTE — Progress Notes (Signed)
PT Note  Patient Details Name: PARMINDER CUPPLES MRN: [id number] DOB: 09/19/1940   Cancelled Treatment:     Pt transitioning to hospice care and plans dc to [facility] in next day or so.  PT services will sign off at this time.   Rosalee Tolley 02/23/2018, 9:00 AM

## 2018-02-23 NOTE — Progress Notes (Signed)
Barry Curry and his family met with hospice yesterday.  It sounds like they will want to go to [facility].  I will have to call [facility] to see if we can get a bed for him.  I am still surprised that he is in the ICU.  I really do not think that he needs to be put on monitor.  Urine might be a lot easier if he was moved upstairs so that he can get a little bit more rest.  The ICU staff have done a fantastic job with him.  He is not hurting.  He still has a little bit of a cough.  He has had no appetite.  I would not force him to eat anything.  I does think this is his way of his body starting to shut down.  Hopefully, we can get him over to [facility] in a day.  All of his vital signs are pretty stable.  There is really no change in his physical exam.  Again, we will see about try to get [name] over to [facility] in a day or so.  Again, it would be nice to get him out of the ICU.  I think he and his family were also like to be out of the ICU so they can spend more time with him.  Lattie Haw, MD  Romans 15:13

## 2018-02-23 NOTE — Discharge Summary (Signed)
Physician Discharge Summary  Barry Curry ZOX:[id number] DOB: [birth year] DOA: 02/16/2018  PCP: [name], PA-C  Admit date: 02/16/2018 Discharge date: 02/23/2018  Time spent: 20 minutes  Recommendations for Outpatient Follow-up:  1. Patient going to a residential hospice for end-of-life care   Discharge Diagnoses:  Principal Problem:   Healthcare-associated pneumonia Active Problems:   PAF (paroxysmal atrial fibrillation) (HCC)   Clear cell carcinoma of kidney, left (HCC)   CAD (coronary artery disease)   CKD (chronic kidney disease), stage III (HCC)   Pressure injury of skin   Protein-calorie malnutrition, severe   Discharge Condition: Guarded less than 2 weeks survival  Diet recommendation: Comfort  Filed Weights   02/16/18 1632 02/18/18 0430 02/22/18 0351  Weight: 86 kg 87.7 kg 87.7 kg    History of present illness:  77 year old male with left kidney nephrectomy 06/2017 with metastases and recent VATS 12/2017, A. fib CAD CKD 3 admitted with right lower lobe pneumonia During hospital stay his oncologist was consulted and it was felt that to respect his quality of life they discussed hospice which was amenable to the family-he received 1 unit of transfusion of blood cells over this hospital stay-CT scan showed a tumor above the right pleural space and patient was transitioned to comfort measures He is not in severe pain but may need usual hospice specific medications such as morphine and Ativan etc. I will be discharging him with a nebulizer I met with the family and they are comfortable and are actively pursuing this plan  Discharge Exam: Vitals:   02/23/18 0800 02/23/18 0804  BP:    Pulse:  (!) 116  Resp:  (!) 26  Temp: 98 F (36.7 C)   SpO2:  96%    General: Awake alert no distress Cardiovascular: S1-S2 tachycardic 120s Respiratory: Clinically clear no added sound Abdomen soft no rebound no guarding No lower extremity edema Neurologically  intact  Discharge Instructions   Discharge Instructions    Diet - low sodium heart healthy   Complete by:  As directed    Increase activity slowly   Complete by:  As directed      Allergies as of 02/23/2018      Reactions   Penicillins Rash   Has patient had a PCN reaction causing immediate rash, facial/tongue/throat swelling, SOB or lightheadedness with hypotension: No Has patient had a PCN reaction causing severe rash involving mucus membranes or skin necrosis: No Has patient had a PCN reaction that required hospitalization: No Has patient had a PCN reaction occurring within the last 10 years: No If all of the above answers are "NO", then may proceed with Cephalosporin use.      Medication List    STOP taking these medications   acetaminophen 500 MG tablet Commonly known as:  TYLENOL   diclofenac sodium 1 % Gel Commonly known as:  VOLTAREN   dronedarone 400 MG tablet Commonly known as:  MULTAQ   ertapenem 1 g injection Commonly known as:  INVANZ   feeding supplement Liqd   ferrous sulfate 325 (65 FE) MG EC tablet   fluticasone 50 MCG/ACT nasal spray Commonly known as:  FLONASE   Gerhardt's butt cream Crea   levofloxacin 500 MG tablet Commonly known as:  LEVAQUIN   lipase/protease/amylase 36000 UNITS Cpep capsule Commonly known as:  CREON   liver oil-zinc oxide 40 % ointment Commonly known as:  DESITIN   loperamide 2 MG capsule Commonly known as:  IMODIUM   megestrol 400 MG/10ML  suspension Commonly known as:  MEGACE   Melatonin 3 MG Tabs   metoprolol tartrate 25 MG tablet Commonly known as:  LOPRESSOR   pantoprazole 40 MG tablet Commonly known as:  PROTONIX   tamsulosin 0.4 MG Caps capsule Commonly known as:  FLOMAX   traMADol 50 MG tablet Commonly known as:  ULTRAM   vitamin B-6 25 MG tablet Commonly known as:  pyridOXINE     TAKE these medications   albuterol (2.5 MG/3ML) 0.083% nebulizer solution Commonly known as:  PROVENTIL Take 3  mLs (2.5 mg total) by nebulization every 4 (four) hours as needed for wheezing or shortness of breath.      Allergies  Allergen Reactions  . Penicillins Rash    Has patient had a PCN reaction causing immediate rash, facial/tongue/throat swelling, SOB or lightheadedness with hypotension: No Has patient had a PCN reaction causing severe rash involving mucus membranes or skin necrosis: No Has patient had a PCN reaction that required hospitalization: No Has patient had a PCN reaction occurring within the last 10 years: No If all of the above answers are "NO", then may proceed with Cephalosporin use.       The results of significant diagnostics from this hospitalization (including imaging, microbiology, ancillary and laboratory) are listed below for reference.    Significant Diagnostic Studies: Dg Chest 1 View  Result Date: 01/29/2018 CLINICAL DATA:  Status post right thoracentesis EXAM: CHEST  1 VIEW COMPARISON:  01/28/2018 FINDINGS: Right-sided pleural effusion has reduced significantly. Some residual pleural thickening is noted laterally. No pneumothorax is seen. Cardiac shadow is stable. Left lung remains clear. IMPRESSION: Reduction in right-sided pleural effusion following thoracentesis. No pneumothorax is noted. Electronically Signed   By: Inez Catalina M.D.   On: 01/29/2018 11:11   Dg Chest 1 View  Result Date: 01/26/2018 CLINICAL DATA:  Status post right thoracentesis. EXAM: CHEST  1 VIEW COMPARISON:  01/26/2018 FINDINGS: Loculated right pleural effusion. No evidence of pneumothorax post thoracentesis. Chronic elevation of left hemidiaphragm. Stably enlarged cardiac silhouette. Mediastinal contours appear intact. 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 Low lung volumes. Lateral humeral prosthesis, partially visualized. Soft tissues are grossly normal. IMPRESSION: Loculated right pleural effusion. No evidence of pneumothorax post thoracentesis. Stably enlarged cardiac silhouette.  Electronically Signed   By: Fidela Salisbury M.D.   On: 01/26/2018 13:13   Dg Chest 2 View  Result Date: 02/16/2018 CLINICAL DATA:  Shortness of breath. History of melanoma and renal cell carcinoma EXAM: CHEST - 2 VIEW COMPARISON:  February 07, 2018 FINDINGS: There is loculated pleural effusion on the right inferolaterally. There is also fluid tracking along the right major fissure. The left lung is clear. Heart size and pulmonary vascularity are normal. There is less subcutaneous air on the right compared to most recent study. Pacemaker leads are attached to the right atrium and right ventricle. There is aortic atherosclerosis. No evident adenopathy. 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 Central catheter has been removed.  No evident pneumothorax. IMPRESSION: Persistent loculated effusion on the right, most notably along the inferolateral aspect. Underlying airspace opacity could be obscured by this effusion. Left lung clear. Stable cardiac silhouette. There is aortic atherosclerosis. Pacemaker leads attached to right atrium and right ventricle. Aortic Atherosclerosis (ICD10-I70.0). Electronically Signed   By: Lowella Grip III M.D.   On: 02/16/2018 18:01   Dg Chest 2 View  Result Date: 02/07/2018 CLINICAL DATA:  Evaluate for pneumothorax. Recent VATS procedure with pleural biopsy and talc pleurodesis. EXAM: CHEST -  2 VIEW COMPARISON:  02/05/2018 FINDINGS: There continues to be a large amount of subcutaneous gas throughout the right side of the chest. The amount of subcutaneous gas is similar to the recent comparison examination. Lateral view demonstrates a small gas-filled collection along the posterior aspect of the right chest. This may represent a small focus of loculated pleural air. No large pneumothorax. Patchy pleural and parenchymal densities throughout the right chest have minimally changed. Left lung appears to be clear. Difficult to exclude a small left effusion. Heart size is  within normal limits and stable. Dual chamber cardiac pacemaker is again noted but the leads are incompletely visualized. Right arm PICC line tip in the lower SVC and stable. There are bilateral shoulder replacements. IMPRESSION: Scattered pleural and parenchymal densities throughout the right chest have minimally changed. Evidence for a small gas collection in the posterior right chest which may represent loculated pleural air. Overall, there is not a large pneumothorax. Stable subcutaneous emphysema along the right side of the chest and neck. Electronically Signed   By: Markus Daft M.D.   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 Pneumonia. Dyspnea. Inpatient. EXAM: CT CHEST WITHOUT CONTRAST TECHNIQUE: Multidetector CT imaging of the chest was performed following the standard protocol without IV contrast. COMPARISON:  02/16/2018 chest CT angiogram. 02/17/2018 chest radiograph. FINDINGS: Cardiovascular: Normal heart size. No significant pericardial effusion/thickening. Coronary atherosclerosis. Two lead left subclavian pacemaker is noted with lead tips in the right atrium and right ventricular apex. Atherosclerotic nonaneurysmal thoracic aorta. Prominently dilated main pulmonary artery (4.3 cm diameter), unchanged. Mediastinum/Nodes: No discrete thyroid nodules. Mildly patulous esophagus with debris level in the midthoracic esophagus. No pathologically enlarged axillary, mediastinal or hilar lymph nodes, noting limited sensitivity for the detection of hilar adenopathy on this noncontrast study. Stable coarsely calcified right hilar nodes from prior granulomatous disease. Lungs/Pleura: Minimal scattered right pleural gas has slightly decreased in the interval. No left pneumothorax. Extensive irregular masslike pleural thickening throughout the right pleural space including along the  fissures with small loculated pleural effusion, measuring up to 3.3 cm thickness anteriorly (series 2/image 106), unchanged. Stable scattered curvilinear hyperdensities in the right pleural space. No left pleural effusion. New small nodular focus of consolidation in the right lower lobe (series 5/image 125). Prominent compressive atelectasis throughout the right lung, unchanged. Mild left lung base atelectasis, mildly increased. No additional significant pulmonary nodules. Upper abdomen: Simple 2.3 cm right liver lobe cyst. Cholelithiasis. Right adrenal 3.0 cm mass, not appreciably changed using similar measurement technique. Scattered granulomatous calcifications in the spleen and liver. Musculoskeletal: No aggressive appearing focal osseous lesions. Moderate thoracic spondylosis. Decreased subcutaneous emphysema in the lateral right chest wall. IMPRESSION: 1. 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 Scattered gas in the right pleural space is decreased. Prominent compressive atelectasis in the right lung. 2. New small nodular focus of consolidation in the right lower lobe, probably due to pneumonia or aspiration. Attention on follow-up chest CT. 3. Mildly patulous esophagus with debris level in the midthoracic esophagus. 4. Stable right adrenal metastasis. 5. Prominent main pulmonary artery dilatation, suggesting pulmonary arterial hypertension. Aortic Atherosclerosis (ICD10-I70.0). Electronically Signed   By: Ilona Sorrel M.D.   On: 02/20/2018 11:47   Ct Angio Chest Pe W And/or Wo Contrast  Result Date: 02/16/2018 CLINICAL DATA:  Cough. Pneumonia. Recurrent pleural effusions of the right lung. History of metastatic renal carcinoma. Left nephrectomy performed in January 2019. EXAM: CT ANGIOGRAPHY CHEST WITH CONTRAST  TECHNIQUE: Multidetector CT imaging of the chest was performed using the standard protocol during bolus administration of intravenous contrast. Multiplanar CT image reconstructions and MIPs were obtained to evaluate the vascular anatomy. CONTRAST:  128m ISOVUE-370 IOPAMIDOL (ISOVUE-370) INJECTION 76% COMPARISON:  Current chest radiograph and prior chest radiographs. FINDINGS: Cardiovascular: There is satisfactory opacification of the pulmonary arteries to the segmental level. There is no evidence of a pulmonary embolism. Heart is normal size. No pericardial effusion. Mild left coronary artery calcifications. Great vessels are normal in caliber. Mild aortic atherosclerosis. 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 Mediastinum/Nodes: Neck base assessment limited by artifact from bilateral shoulder prosthesis. No visualized neck base or axillary mass or adenopathy. There is pleural based opacity that encroaches upon the right aspect of the mediastinum. There is no convincing mediastinal based mass. No discrete enlarged lymph node. Trachea is patent. Esophagus is mildly distended. Lungs/Pleura: With a small amount of associated loculated pleural based fluid. There are cavitary lesions in the right lower lobe with air-fluid levels. Some fluid tracks along the right oblique fissure. No left pleural effusion or pleural based masses. 4 mm nodule in the right upper lobe, image 55, series 5. Patchy consolidation noted in the posterior right upper lobe adjacent to the cavitary lesions mild peribronchovascular opacity in the left lower lobe this most likely subsegmental atelectasis. Upper Abdomen: 2.6 x 2.0 cm right adrenal mass. 2 cm hypoattenuating mass in the posterior segment of the right liver lobe, which is likely a cyst. No acute findings in the upper abdomen. Musculoskeletal: No fractures or acute finding. No osteoblastic or osteolytic lesions. Subcutaneous emphysema seen  along the right chest wall, decreased from older prior chest radiographs. Review of the MIP images confirms the above findings. IMPRESSION: 1. No evidence of a pulmonary embolism. 2. Extensive right hemithorax pleural based masses consistent pleural based metastatic disease. 3. Small amount of loculated right pleural fluid. Cystic lesions in the right lower lobe with adjacent patchy consolidation. These latter areas of abnormality may reflect infection or cavitary neoplastic disease. 4. 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 Aortic Atherosclerosis (ICD10-I70.0). Electronically Signed   By: DLajean ManesM.D.   On: 02/16/2018 19:40   Dg Chest Port 1 View  Result Date: 02/17/2018 CLINICAL DATA:  Tachypnea. Respiratory failure. History of metastatic renal carcinoma. EXAM: PORTABLE CHEST 1 VIEW COMPARISON:  CT 02/16/2018.  Chest x-ray 07/18/2017. FINDINGS: Cardiac pacer noted with lead tips over the right atrium right ventricle. Heart size normal. Diffuse right lung infiltrate. Multiple pleural-based densities are again noted consistent with pleural base metastatic disease bilateral shoulder replacements. Right chest wall subcutaneous emphysema again noted. IMPRESSION: 1. Diffuse right lung infiltrate suggesting pneumonia. Multiple pleural-based densities are again noted consistent with pleural base metastatic lesions again noted. 2. Right chest wall subcutaneous emphysema again noted. No pneumothorax noted. Electronically Signed   By: TMarcello Moores Register   On: 02/17/2018 09:57   Dg Chest Port 1 View  Result Date: 02/05/2018 CLINICAL DATA:  Encounter for SOB EXAM: PORTABLE CHEST - 1 VIEW COMPARISON:  Earlier film the same day FINDINGS: Extensive right lateral and neck subcutaneous emphysema as before. No pneumothorax evident. Right lateral pleural thickening or loculated effusion. Adjacent consolidation/atelectasis in the mid and lower right lung stable.  Left lung clear. Heart size normal.  Aortic Atherosclerosis (ICD10-170.0). Right arm PICC line to the cavoatrial junction. Left subclavian transvenous pacemaker stable. Bilateral shoulder arthroplasty hardware partially seen. IMPRESSION: Stable appearance since earlier film of the same day. Electronically Signed   By: Lucrezia Europe M.D.   On: 02/05/2018 12:48   Dg Chest Port 1 View  Result Date: 02/05/2018 CLINICAL DATA:  Status post thoracotomy. EXAM: PORTABLE CHEST 1 VIEW COMPARISON:  February 04, 2018 FINDINGS: Air in the right chest wall is stable. No pneumothorax. Opacity in the right base with associated effusion is stable. The left lung is clear. The cardiomediastinal silhouette is unchanged. A right PICC line is stable. The right chest tube has been removed. IMPRESSION: 1. Removal of right chest tube.  Stable right PICC line. 2. Stable air in the right chest wall. 3. Stable effusion and underlying opacity on the right. No right-sided pneumothorax noted. Electronically Signed   By: Dorise Bullion III M.D   On: 02/05/2018 07:42   Dg Chest Port 1 View  Result Date: 02/04/2018 CLINICAL DATA:  77 year old male with right video assisted thoracotomy with biopsy of pleural tumors, evacuation of hemothorax, and talc pleurodesis performed 02/01/2018. Pathology has returned positive for renal cell carcinoma metastases EXAM: PORTABLE CHEST 1 VIEW COMPARISON:  02/03/2018, CT 01/21/2018 FINDINGS: Cardiomediastinal silhouette unchanged in size and contour. Unchanged cardiac pacing device. Right upper extremity PICC is unchanged. Right thoracostomy tube unchanged, terminating at the apex. No visualized right pneumothorax. Similar appearance of patchy opacities of the mid right lung and lower right lung with blunting of the right costophrenic sulcus. Similar appearance of extensive right subcutaneous and myofacial emphysema extending to the base of the neck. Left lung relatively well aerated. Surgical changes of the shoulders. IMPRESSION: Essentially  unchanged chest x-ray, with unchanged position of right thoracostomy tube and no visualized pneumothorax. Similar appearance of right-sided airspace disease and trace right pleural fluid/tissue. Unchanged right upper extremity PICC. Electronically Signed   By: Corrie Mckusick D.O.   On: 02/04/2018 11:44   Dg Chest Port 1 View  Result Date: 02/03/2018 CLINICAL DATA:  Status post thoracotomy EXAM: PORTABLE CHEST 1 VIEW COMPARISON:  02/02/2018 FINDINGS: Interval extubation.  LEFT lung clear. Chest tube remains in the RIGHT lung. There is atelectasis and fluid along the RIGHT lateral chest wall. Volume loss. No discernible pneumothorax. Extensive subcutaneous gas along the RIGHT chest wall is increased and now extending into the RIGHT neck region. PICC line unchanged.  Removal of NG tube. IMPRESSION: 1. Increase in subcutaneous gas along the RIGHT chest wall which now extends into the neck region. 2. Postsurgical change in the RIGHT hemithorax with chest tube without significant change. 3. Extubation without complication. These results will be called to the ordering clinician or representative by the Radiologist Assistant, and communication documented in the PACS or zVision Dashboard. Electronically Signed   By: Suzy Bouchard M.D.   On: 02/03/2018 11:22   Dg Chest Port 1 View  Result Date: 02/02/2018 CLINICAL DATA:  Chest tube with pleural effusion. EXAM: PORTABLE CHEST 1 VIEW COMPARISON:  Yesterday FINDINGS: Right apical chest tube in stable position. Hazy opacity in the right mid and lower lung is unchanged. Lucency at the right costophrenic sulcus is likely pneumothorax. Extensive chest wall emphysema on the right is stable. Haziness of the lower left chest is likely pleural fluid and atelectasis. Endotracheal tube tip is at the clavicular heads. An orogastric tube reaches the stomach. Stable dual-chamber pacer leads from the left.  Cardiomegaly. IMPRESSION: 1. Stable right chest tube positioning and hazy  right base opacity. Probable basal pneumothorax on the right. 2. Atelectasis and pleural fluid on the left. 3. Stable hardware positioning. Electronically Signed   By: Monte Fantasia M.D.   On: 02/02/2018 09:54   Dg Chest Port 1 View  Result Date: 02/01/2018 CLINICAL DATA:  Orogastric tube placement. EXAM: PORTABLE CHEST 1 VIEW COMPARISON:  Radiograph of same day. FINDINGS: Stable cardiomediastinal silhouette. Endotracheal tube is in grossly good position. Interval placement of orogastric tube with tip in expected position of proximal stomach. Left-sided pacemaker is unchanged in position. Right-sided chest tube is again noted without definite pneumothorax. Stable right basilar subsegmental atelectasis is noted probable with small loculated right pleural effusion. Stable subcutaneous emphysema is seen over right lateral chest wall. Stable left basilar atelectasis and possible effusion is noted. Status post bilateral shoulder arthroplasties. IMPRESSION: Endotracheal and orogastric tube are in grossly good position. Stable position of right-sided chest tube is noted without definite pneumothorax. Stable right basilar subsegmental atelectasis is noted with probable small loculated right pleural effusion Electronically Signed   By: Marijo Conception, M.D.   On: 02/01/2018 19:42   Dg Chest Port 1 View  Result Date: 02/01/2018 CLINICAL DATA:  S/P thoracotomy EXAM: PORTABLE CHEST 1 VIEW COMPARISON:  01/29/2018 and CT 01/21/2017 FINDINGS: Endotracheal tube is 4.9 centimeters above the carina. The patient has a RIGHT-sided chest tube, tip overlying the RIGHT lung apex. There is focal pleural based opacity at the RIGHT mid lung zone. Decreased RIGHT pleural effusion. There is moderate RIGHT chest wall emphysema. Suspect RIGHT basilar pneumothorax, probably small. The LEFT lung is clear. Shallow inflation. IMPRESSION: 1. Postoperative changes. 2. Moderate RIGHT subcutaneous emphysema. Suspect small RIGHT basilar  pneumothorax. 3. Smaller RIGHT pleural effusion. Electronically Signed   By: Nolon Nations M.D.   On: 02/01/2018 11:42   Dg Chest Port 1 View  Result Date: 01/28/2018 CLINICAL DATA:  Short of breath EXAM: PORTABLE CHEST 1 VIEW COMPARISON:  01/26/2018 FINDINGS: Progressive right pleural effusion and right lower lobe airspace disease. Left lung remains clear. Heart size and vascularity normal. Bilateral shoulder replacement.  Dual lead pacemaker unchanged. IMPRESSION: Interval progression of right lower lobe effusion and right lower lobe airspace disease. Electronically Signed   By: Franchot Gallo M.D.   On: 01/28/2018 11:24   Dg Chest Port 1 View  Result Date: 01/26/2018 CLINICAL DATA:  Shortness of Breath EXAM: PORTABLE CHEST 1 VIEW COMPARISON:  01/22/2018 FINDINGS: Left pacer remains in place, unchanged. Right base atelectasis with small right effusion. No confluent opacity on the left. Heart is normal size. IMPRESSION: Small right effusion with right base atelectasis. Electronically Signed   By: Rolm Baptise M.D.   On: 01/26/2018 07:36   [place] Ekg Site Rite  Result Date: 02/02/2018 If Site Rite image not attached, placement could not be confirmed due to current cardiac rhythm.  Ir Thoracentesis Asp Pleural Space W/img Guide  Result Date: 01/26/2018 INDICATION: Recurrent right sided pleural effusion with increase dyspnea. EXAM: ULTRASOUND GUIDED RIGHT THORACENTESIS MEDICATIONS: 10 mL 2% lidocaine. COMPLICATIONS: None immediate. PROCEDURE: An ultrasound guided thoracentesis was thoroughly discussed with the patient and questions answered. The benefits, risks, alternatives and complications were also discussed. The patient understands and wishes to proceed with the procedure. Written consent was obtained. Ultrasound was performed to localize and mark an adequate pocket of fluid in the right chest. The area was then prepped and draped in the normal sterile fashion. 2% Lidocaine was  used for local  anesthesia. Under ultrasound guidance a 6 Fr Safe-T-Centesis catheter was introduced. Thoracentesis was performed. The catheter was removed and a dressing applied. FINDINGS: A total of approximately 2.5L of bloody fluid was removed. Samples were sent to the laboratory as requested by the clinical team. IMPRESSION: Successful ultrasound guided right thoracentesis yielding of pleural fluid. Read by Candiss Norse, PA-C Electronically Signed   By: Aletta Edouard M.D.   On: 01/26/2018 13:25   US Thoracentesis Asp Pleural Space W/img Guide  Result Date: 01/29/2018 INDICATION: Patient with history of shortness of breath and recurrence right pleural effusion. Request is made for diagnostic and therapeutic right thoracentesis. EXAM: ULTRASOUND GUIDED DIAGNOSTIC AND THERAPEUTIC RIGHT THORACENTESIS MEDICATIONS: 10 mL of 2% lidocaine COMPLICATIONS: None immediate. PROCEDURE: An ultrasound guided thoracentesis was thoroughly discussed with the patient and questions answered. The benefits, risks, alternatives and complications were also discussed. The patient understands and wishes to proceed with the procedure. Written consent was obtained. Ultrasound was performed to localize and mark an adequate pocket of fluid in the right chest. The area was then prepped and draped in the normal sterile fashion. 2% Lidocaine was used for local anesthesia. Under ultrasound guidance a 6 Fr Safe-T-Centesis catheter was introduced. Thoracentesis was performed. The catheter was removed and a dressing applied. FINDINGS: A total of approximately 800 mL of blood-tinged fluid was removed. Samples were sent to the laboratory as requested by the clinical team. IMPRESSION: Successful ultrasound guided right thoracentesis yielding 800 mL of pleural fluid. Read by: Earley Abide, PA-C Electronically Signed   By: Jacqulynn Cadet M.D.   On: 01/29/2018 11:34    Microbiology: Recent Results (from the past 240 hour(s))  Blood Culture (routine x  2)     Status: None   Collection Time: 02/16/18  5:20 PM  Result Value Ref Range Status   Specimen Description   Final    BLOOD BLOOD LEFT ARM Performed at [facility], [address] [place], [place] [address]    Special Requests   Final    BOTTLES DRAWN AEROBIC AND ANAEROBIC Blood Culture adequate volume Performed at [facility], [address] [place], [place] [address]    Culture   Final    NO GROWTH 5 DAYS Performed at [facility], [place] [address], [place], [place] [address]    Report Status 02/21/2018 FINAL  Final  MRSA PCR Screening     Status: None   Collection Time: 02/16/18 11:03 PM  Result Value Ref Range Status   MRSA by PCR NEGATIVE NEGATIVE Final    Comment:        The GeneXpert MRSA Assay (FDA approved for NASAL specimens only), is one component of a comprehensive MRSA colonization surveillance program. It is not intended to diagnose MRSA infection nor to guide or monitor treatment for MRSA infections. Performed at [facility], [place] [address] [place], [place] [address]   Blood Culture (routine x 2)     Status: None   Collection Time: 02/16/18 11:31 PM  Result Value Ref Range Status   Specimen Description   Final    BLOOD RIGHT ANTECUBITAL Performed at [facility] [address], [place], [place] [address]    Special Requests   Final    BOTTLES DRAWN AEROBIC AND ANAEROBIC Blood Culture adequate volume Performed at [facility] [address], [place], [place] [address]    Culture   Final  NO GROWTH 5 DAYS Performed at [facility], [place] [address], [place], [place] [address]    Report Status 02/22/2018 FINAL  Final     Labs: Basic Metabolic Panel: Recent Labs  Lab 02/17/18 0331 02/18/18 0928 02/19/18 0348 02/20/18 0354 02/21/18 0308  NA 138 140 138 139 140  K 5.3* 5.2* 4.9 5.1 5.2*  CL 106 109 110 108 108  CO2 25 20* _0 GLUCOSE 81 59*  118* 111* 104*  BUN 26* 27* 30* 28* 25*  CREATININE 1.29* 1.17 1.25* 1.13 1.04  CALCIUM 8.6* 8.6* 8.3* 8.4* 8.6*  MG 2.1  --   --   --   --    Liver Function Tests: Recent Labs  Lab 02/16/18 1652 02/18/18 0928 02/19/18 0348 02/20/18 0354 02/21/18 0308  AST 35 _1 40  ALT _2 ALKPHOS 84 68 67 67 65  BILITOT 0.7 0.8 0.6 0.4 0.5  PROT 6.5 5.7* 5.4* 5.5* 5.4*  ALBUMIN 2.6* 2.3* 2.0* 2.0* 1.9*   No results for input(s): LIPASE, AMYLASE in the last 168 hours. No results for input(s): AMMONIA in the last 168 hours. CBC: Recent Labs  Lab 02/18/18 0928 02/19/18 0348 02/20/18 0354 02/21/18 0308 02/22/18 0255  WBC 15.2* 16.1* 16.3* 19.5* 20.3*  NEUTROABS 12.8* 13.2* 13.4* 16.2* 18.7*  HGB 9.5* 9.3* 9.0* 9.1* 9.0*  HCT 30.5* 29.4* 28.4* 29.0* 28.6*  MCV 85.7 85.0 84.5 84.8 82.4  PLT 309 290 281 303 264   Cardiac Enzymes: No results for input(s): CKTOTAL, CKMB, CKMBINDEX, TROPONINI in the last 168 hours. BNP: BNP (last 3 results) Recent Labs    01/21/18 1142  BNP 80.8    ProBNP (last 3 results) No results for input(s): PROBNP in the last 8760 hours.  CBG: Recent Labs  Lab 02/17/18 0925 02/19/18 0810  GLUCAP 84 97       Signed:  Nita Sells MD   [facility] 02/23/2018, 11:07 AM

## 2018-02-23 NOTE — Progress Notes (Signed)
Patient picked up by [facility]. Family at bedside. Belongings taken by daughter, Ailene Ravel.

## 2018-02-23 NOTE — Progress Notes (Signed)
Nutrition Brief Note  Patient seen for initial assessment by this RD on 8/22. Diet advanced to Heart Healthy on 8/23. Chart reviewed. Pt now transitioning to comfort care with plans to transfer to [facility] when a bed becomes available. Will liberalize diet to Regular. Ensure Enlive order in for TID; will maintain this order at this time. No further nutrition interventions warranted at this time.  Please re-consult as needed.     Jarome Matin, MS, RD, LDN, [facility] Inpatient Clinical Dietitian Pager # [phone] After hours/weekend pager # [phone]

## 2018-02-25 LAB — BPAM RBC
BLOOD PRODUCT EXPIRATION DATE: 201909192359
Blood Product Expiration Date: 201909192359
ISSUE DATE / TIME: 201908261004
UNIT TYPE AND RH: 6200
Unit Type and Rh: 6200

## 2018-02-25 LAB — TYPE AND SCREEN
ABO/RH(D): A POS
Antibody Screen: NEGATIVE
UNIT DIVISION: 0
Unit division: 0

## 2018-03-01 ENCOUNTER — Encounter (HOSPITAL_COMMUNITY): Payer: Self-pay | Admitting: Hematology & Oncology

## 2018-03-29 DEATH — deceased

## 2019-05-19 IMAGING — DX DG CHEST 1V PORT
1 series · 2 of 2 positions shown · non-contrast
Comparison: Yesterday

CLINICAL DATA: Chest tube with pleural effusion.

EXAM:
PORTABLE CHEST 1 VIEW

[Series 1: chest ap · 0.14mm/px · 2 of 2 slices shown]
[im 1/2]
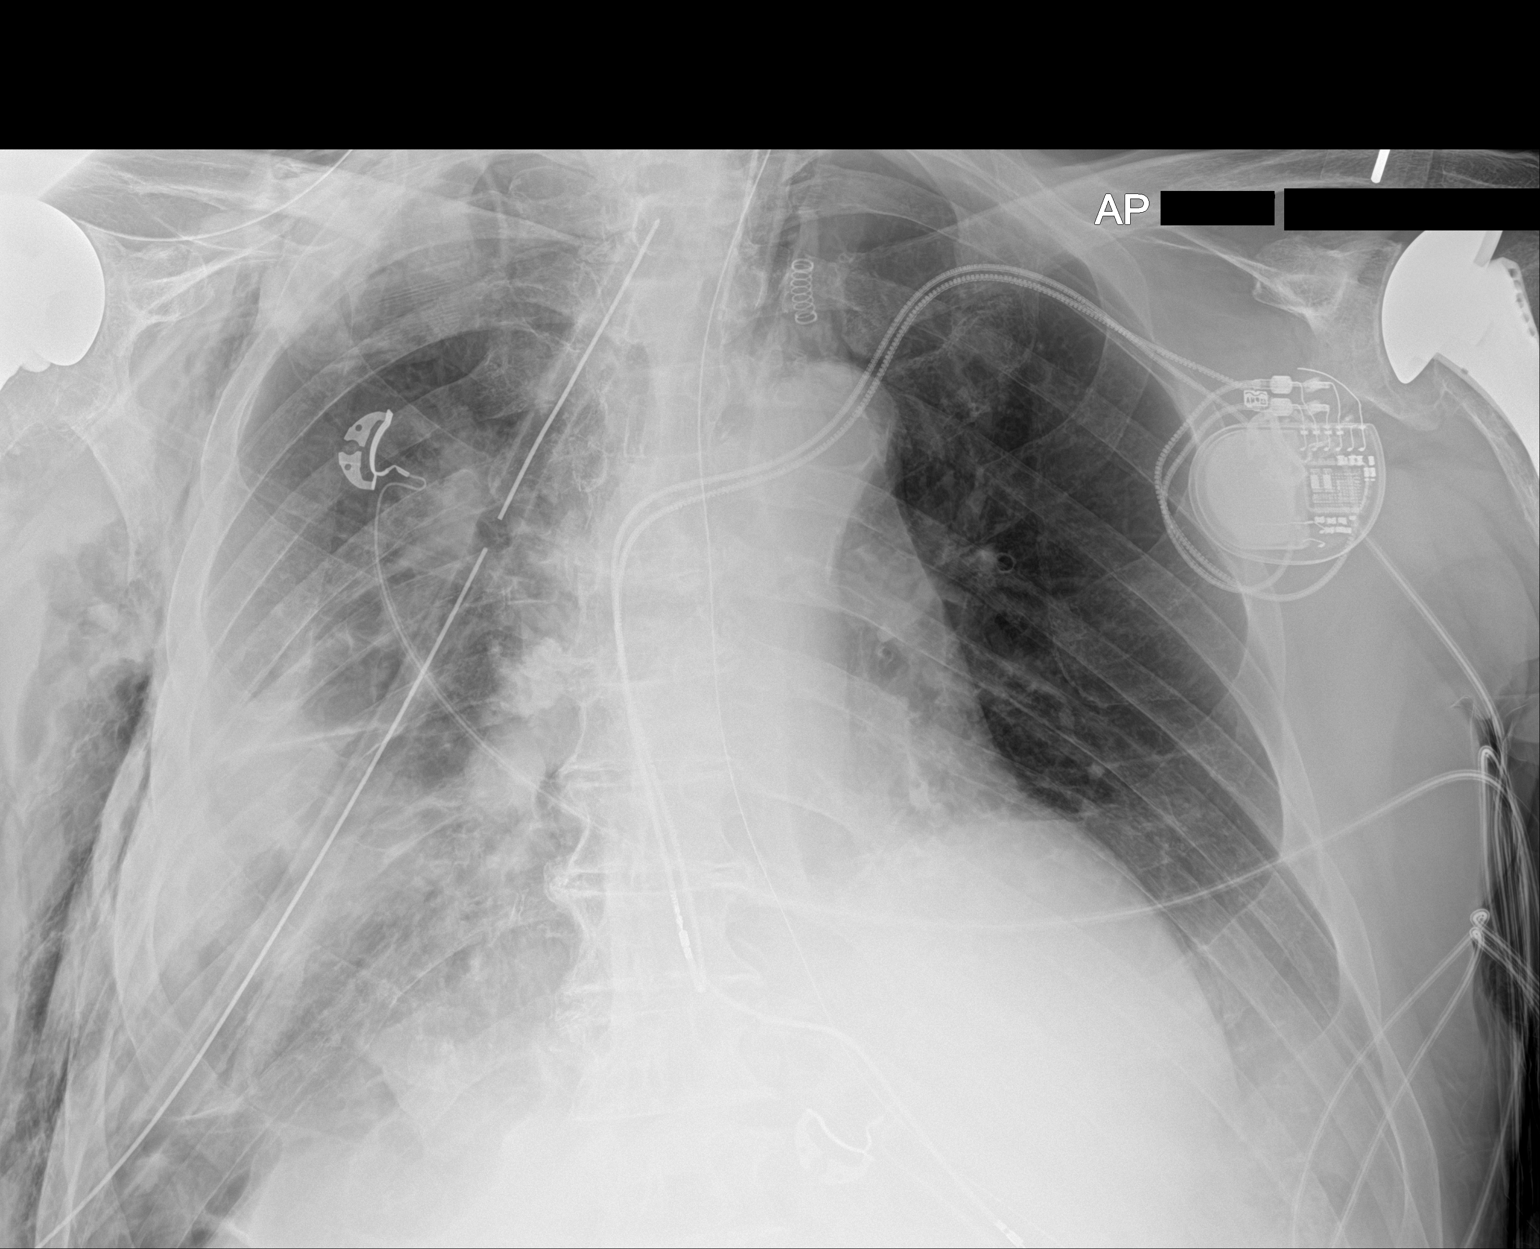
[im 2/2]
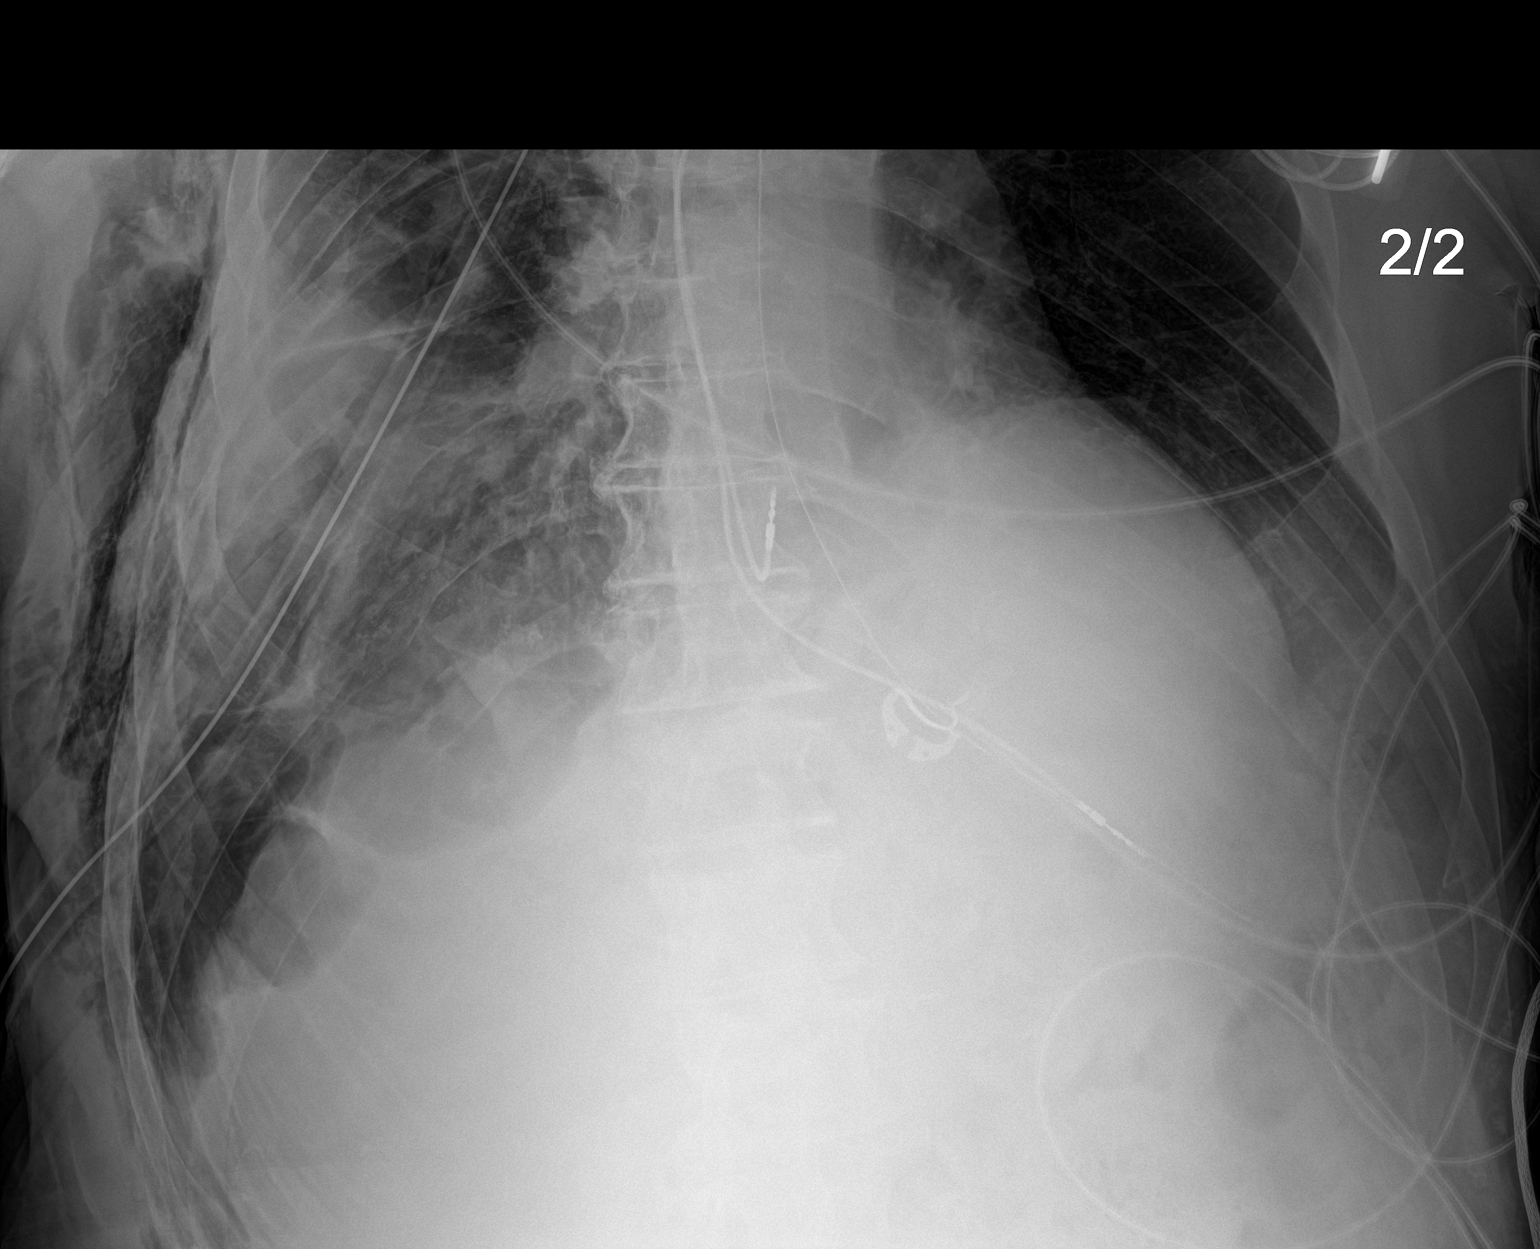

[2 of 2 positions shown; findings below may reference images not displayed]

FINDINGS: Right apical chest tube in stable position. Hazy opacity in the
right mid and lower lung is unchanged. Lucency at the right
costophrenic sulcus is likely pneumothorax. Extensive chest wall
emphysema on the right is stable.

Haziness of the lower left chest is likely pleural fluid and
atelectasis.

Endotracheal tube tip is at the clavicular heads. An orogastric tube
reaches the stomach.

Stable dual-chamber pacer leads from the left.  Cardiomegaly.
IMPRESSION: 1. Stable right chest tube positioning and hazy right base opacity.
Probable basal pneumothorax on the right.
2. Atelectasis and pleural fluid on the left.
3. Stable hardware positioning.

## 2019-05-20 IMAGING — DX DG CHEST 1V PORT
1 series · 1 of 1 positions shown · non-contrast
Comparison: 02/02/2018

CLINICAL DATA: Status post thoracotomy

EXAM:
PORTABLE CHEST 1 VIEW

[chest ap]
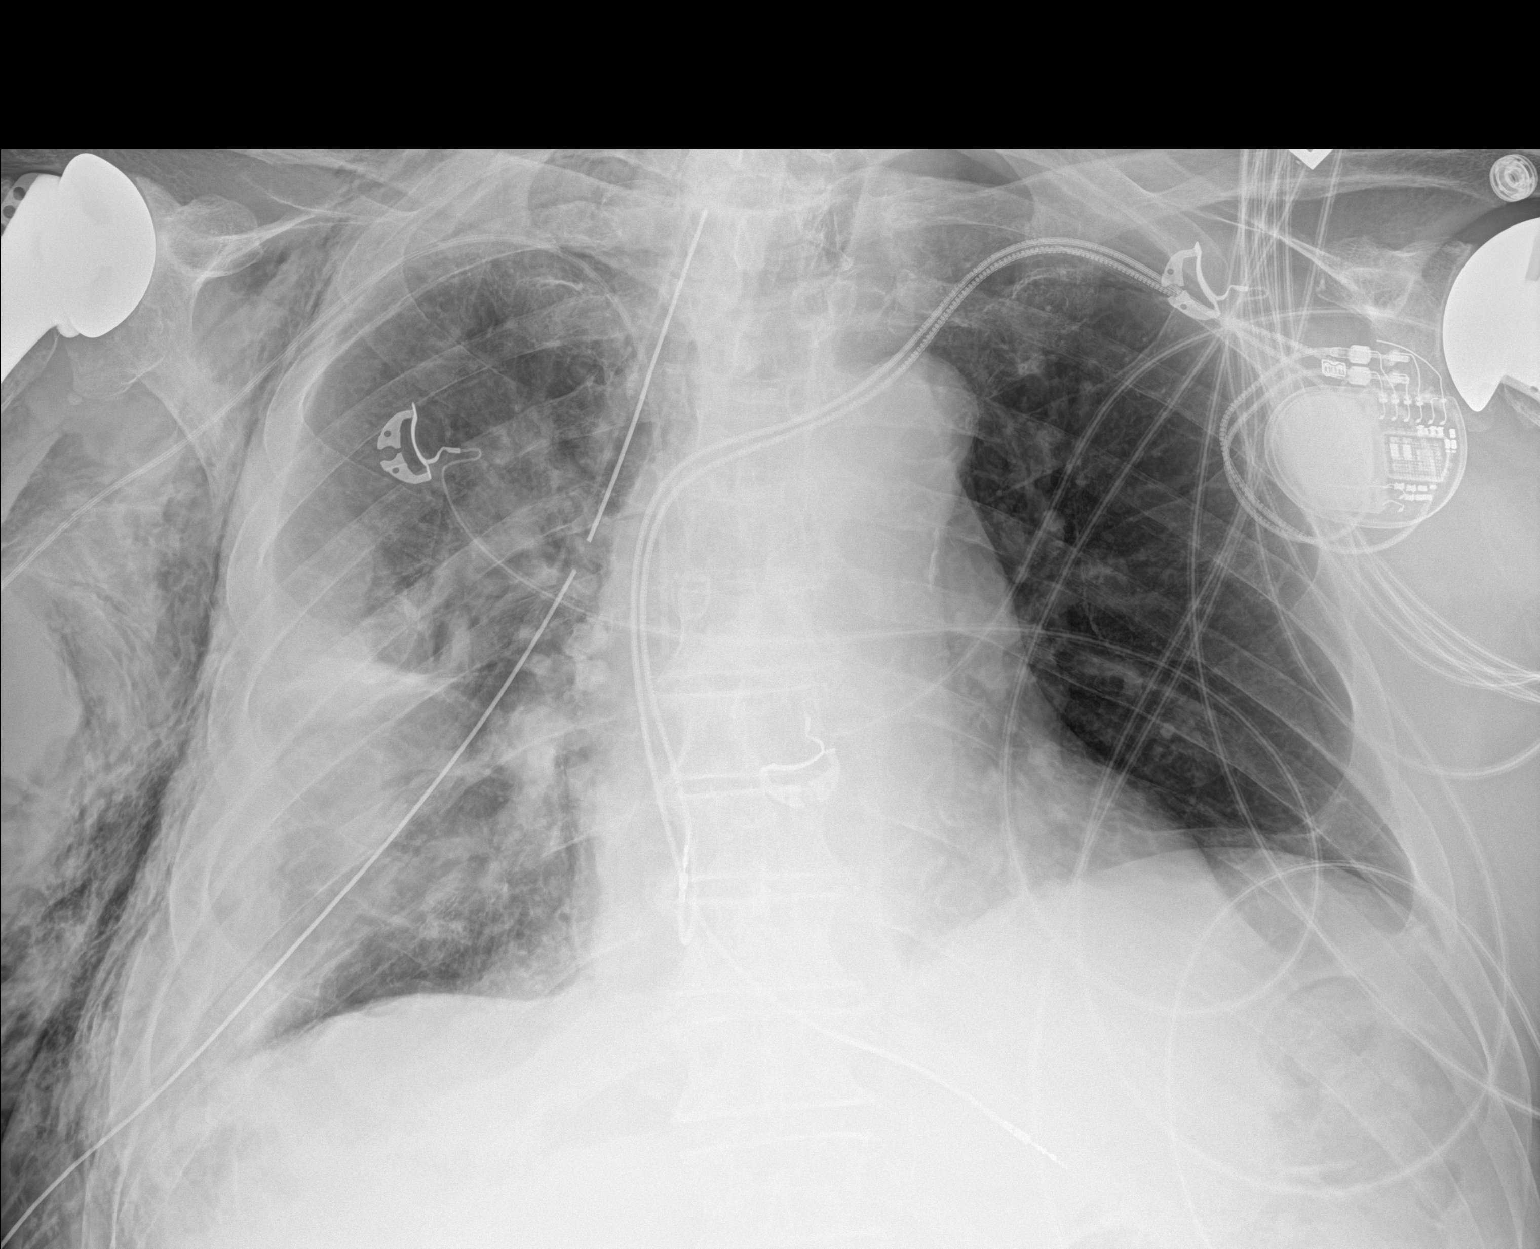

[1 of 1 positions shown; findings below may reference images not displayed]

FINDINGS: Interval extubation.  LEFT lung clear.

Chest tube remains in the RIGHT lung. There is atelectasis and fluid
along the RIGHT lateral chest wall. Volume loss. No discernible
pneumothorax.

Extensive subcutaneous gas along the RIGHT chest wall is increased
and now extending into the RIGHT neck region.

PICC line unchanged.  Removal of NG tube.
IMPRESSION: 1. Increase in subcutaneous gas along the RIGHT chest wall which now
extends into the neck region.
2. Postsurgical change in the RIGHT hemithorax with chest tube
without significant change..
3. Extubation without complication.

These results will be called to the ordering clinician or
representative by the Radiologist Assistant, and communication
documented in the PACS or zVision Dashboard.

## 2019-05-21 IMAGING — DX DG CHEST 1V PORT
1 series · 1 of 1 positions shown · non-contrast
Comparison: 02/03/2018, CT 01/21/2018

CLINICAL DATA: 76-year-old male with right video assisted
thoracotomy with biopsy of pleural tumors, evacuation of hemothorax,
and talc pleurodesis performed 02/01/2018.

Pathology has returned positive for renal cell carcinoma metastases
EXAM:
PORTABLE CHEST 1 VIEW

[chest ap]
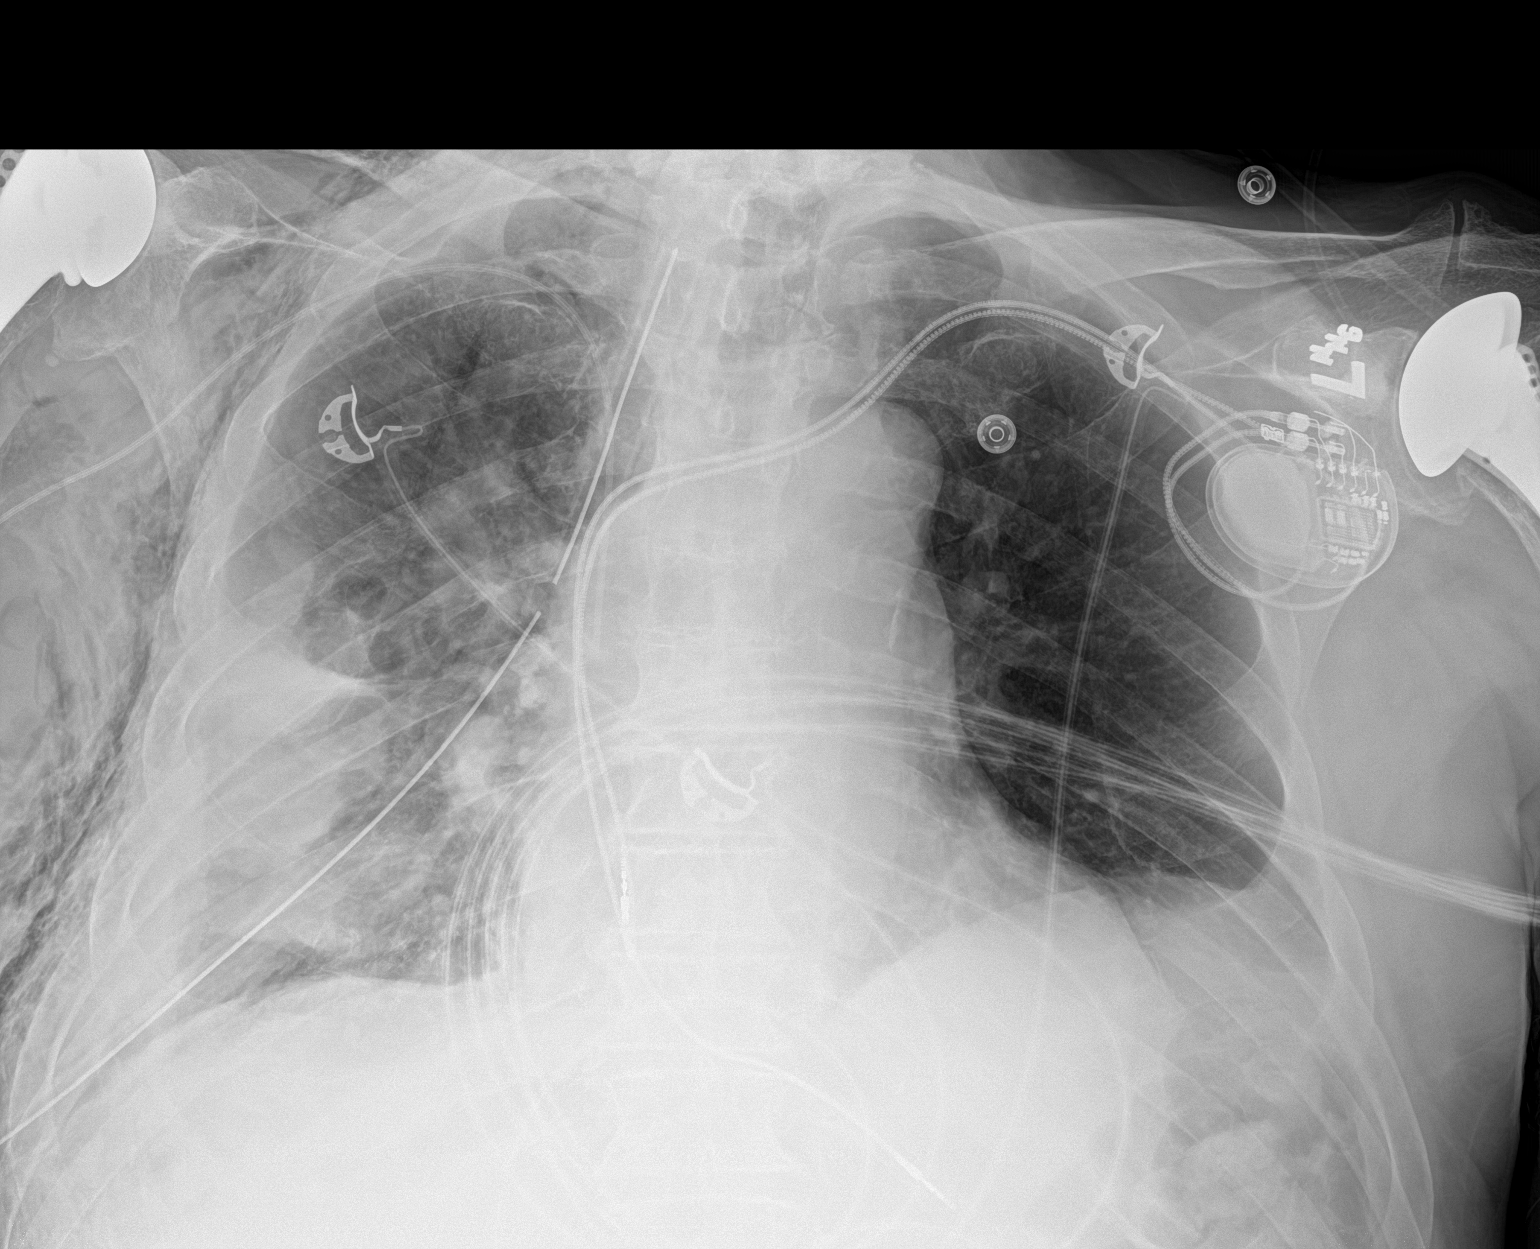

[1 of 1 positions shown; findings below may reference images not displayed]

FINDINGS: Cardiomediastinal silhouette unchanged in size and contour.

Unchanged cardiac pacing device.

Right upper extremity PICC is unchanged.

Right thoracostomy tube unchanged, terminating at the apex.

No visualized right pneumothorax. Similar appearance of patchy
opacities of the mid right lung and lower right lung with blunting
of the right costophrenic sulcus. Similar appearance of extensive
right subcutaneous and myofacial emphysema extending to the base of
the neck.

Left lung relatively well aerated.

Surgical changes of the shoulders.
IMPRESSION: Essentially unchanged chest x-ray, with unchanged position of right
thoracostomy tube and no visualized pneumothorax.

Similar appearance of right-sided airspace disease and trace right
pleural fluid/tissue.

Unchanged right upper extremity PICC.

## 2019-05-22 IMAGING — DX DG CHEST 1V PORT
1 series · 1 of 1 positions shown · non-contrast
Comparison: Earlier film the same day

CLINICAL DATA: Encounter for SOB

EXAM:
PORTABLE CHEST - 1 VIEW

[chest]
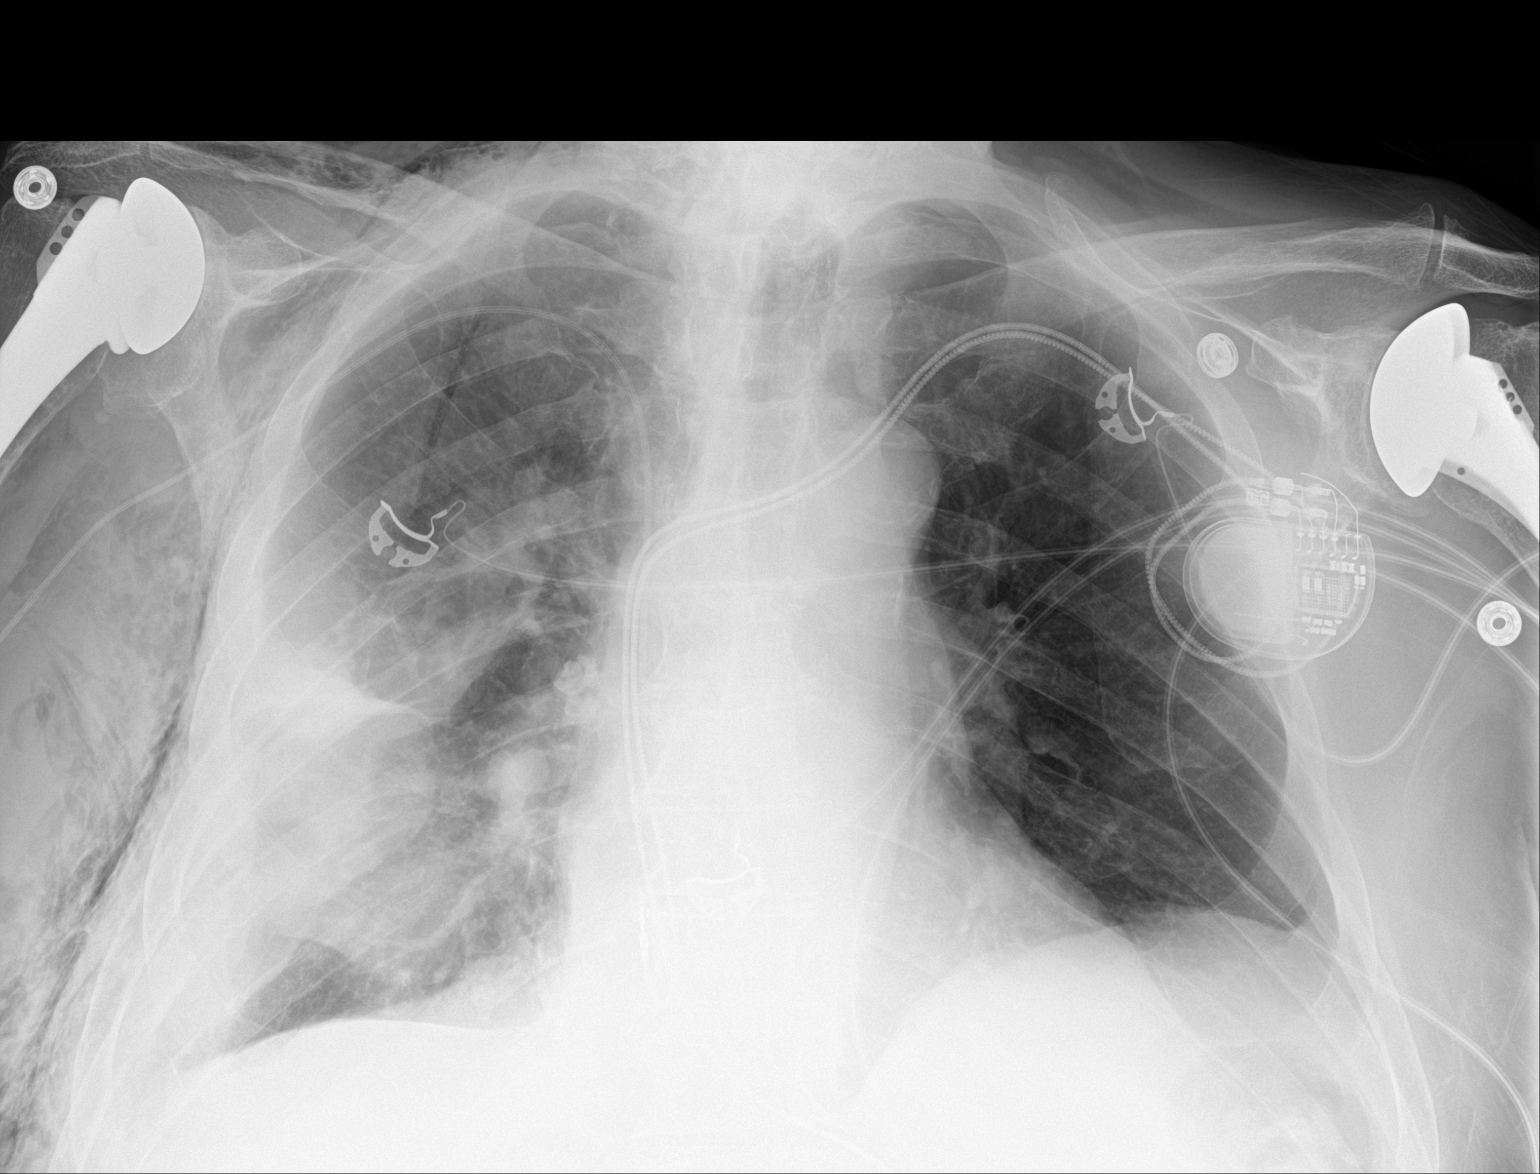

[1 of 1 positions shown; findings below may reference images not displayed]

FINDINGS: Extensive right lateral and neck subcutaneous emphysema as before.
No pneumothorax evident. Right lateral pleural thickening or
loculated effusion. Adjacent consolidation/atelectasis in the mid
and lower right lung stable. Left lung clear.

Heart size normal. Aortic Atherosclerosis ([id number]-[id number]). Right arm
PICC line to the cavoatrial junction.

Left subclavian transvenous pacemaker stable. Bilateral shoulder
arthroplasty hardware partially seen.
IMPRESSION: Stable appearance since earlier film of the same day.

## 2019-06-02 IMAGING — DX DG CHEST 2V
2 series · 2 of 2 positions shown · non-contrast
Comparison: February 07, 2018

CLINICAL DATA: Shortness of breath. History of melanoma and renal
cell carcinoma

EXAM:
CHEST - 2 VIEW

[chest lat]
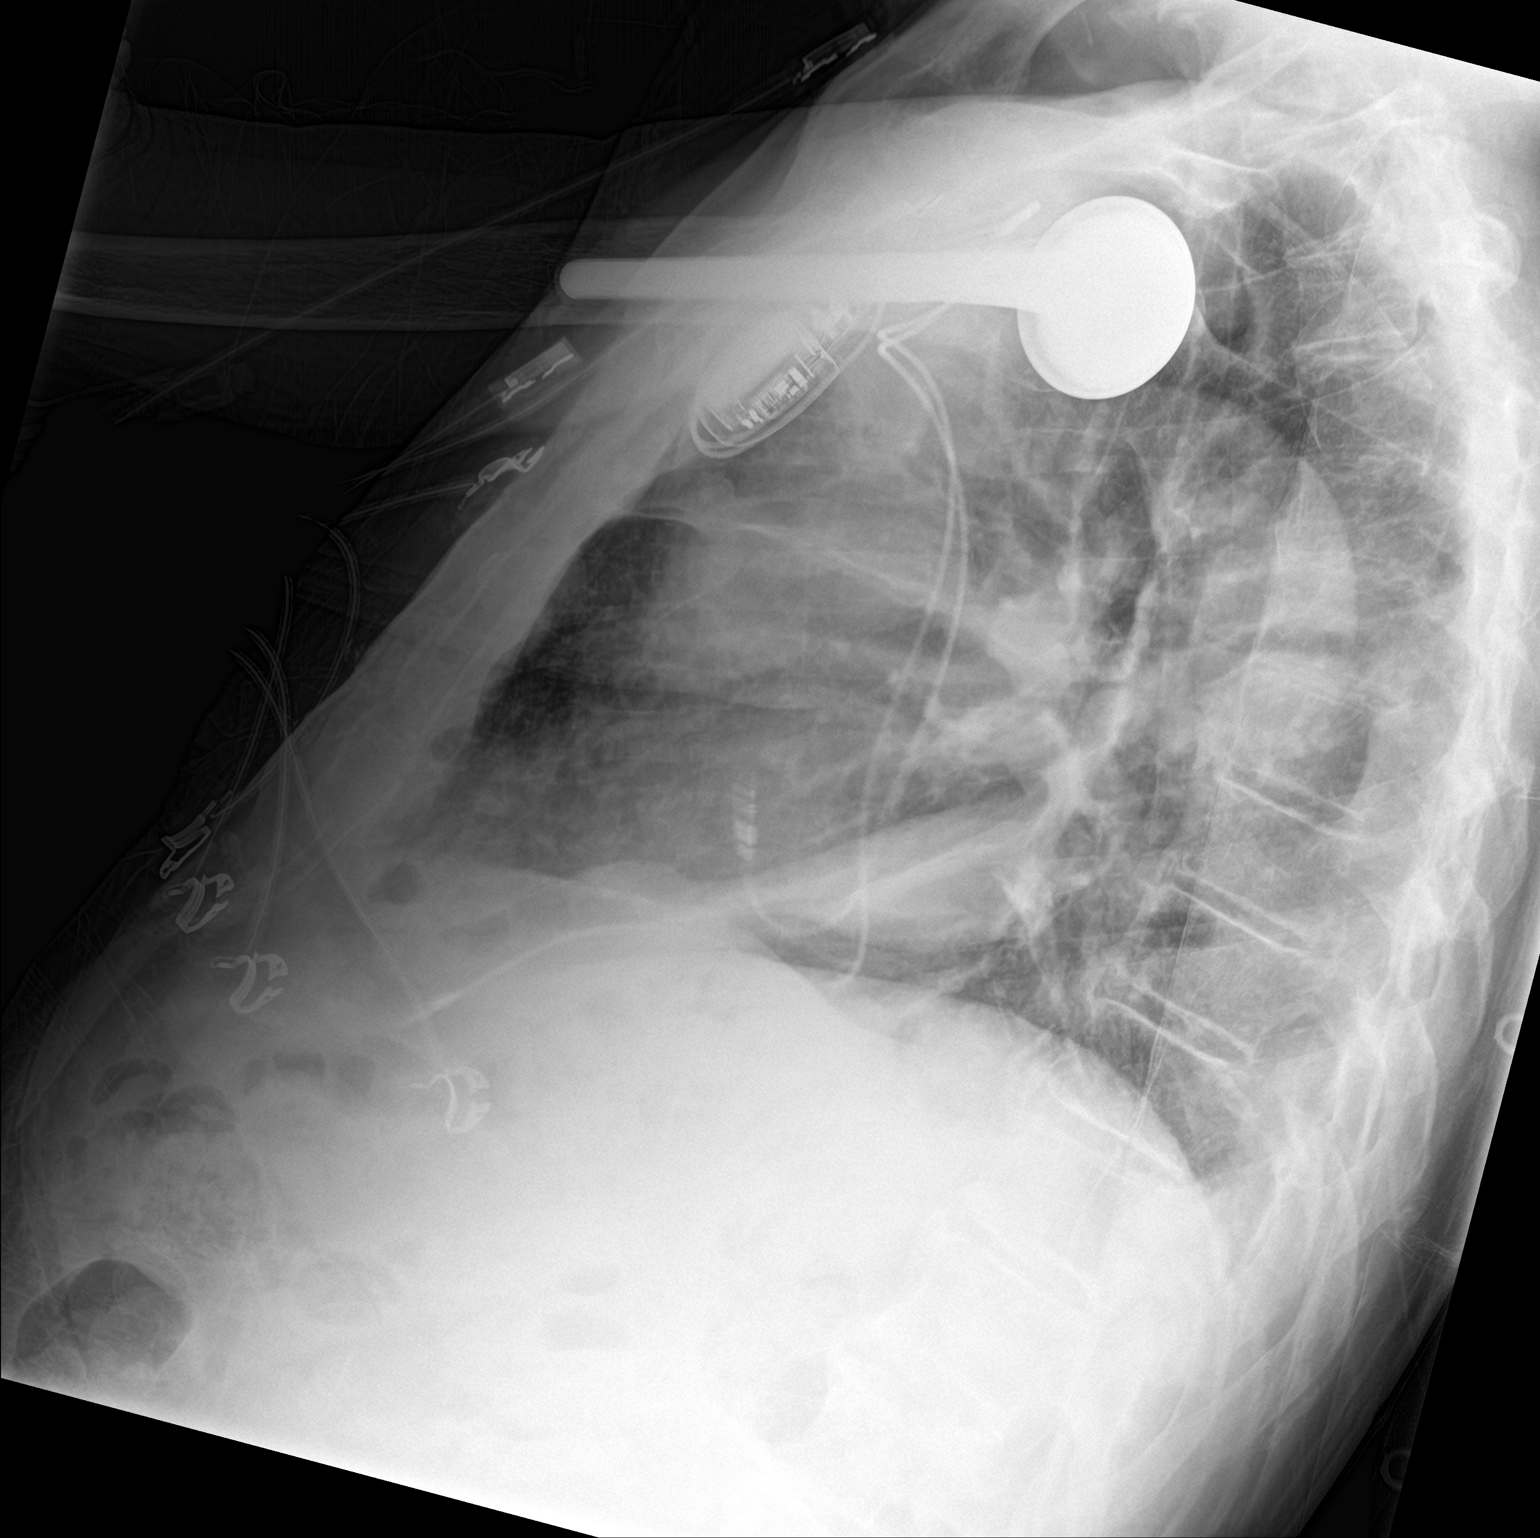

[chest ap]
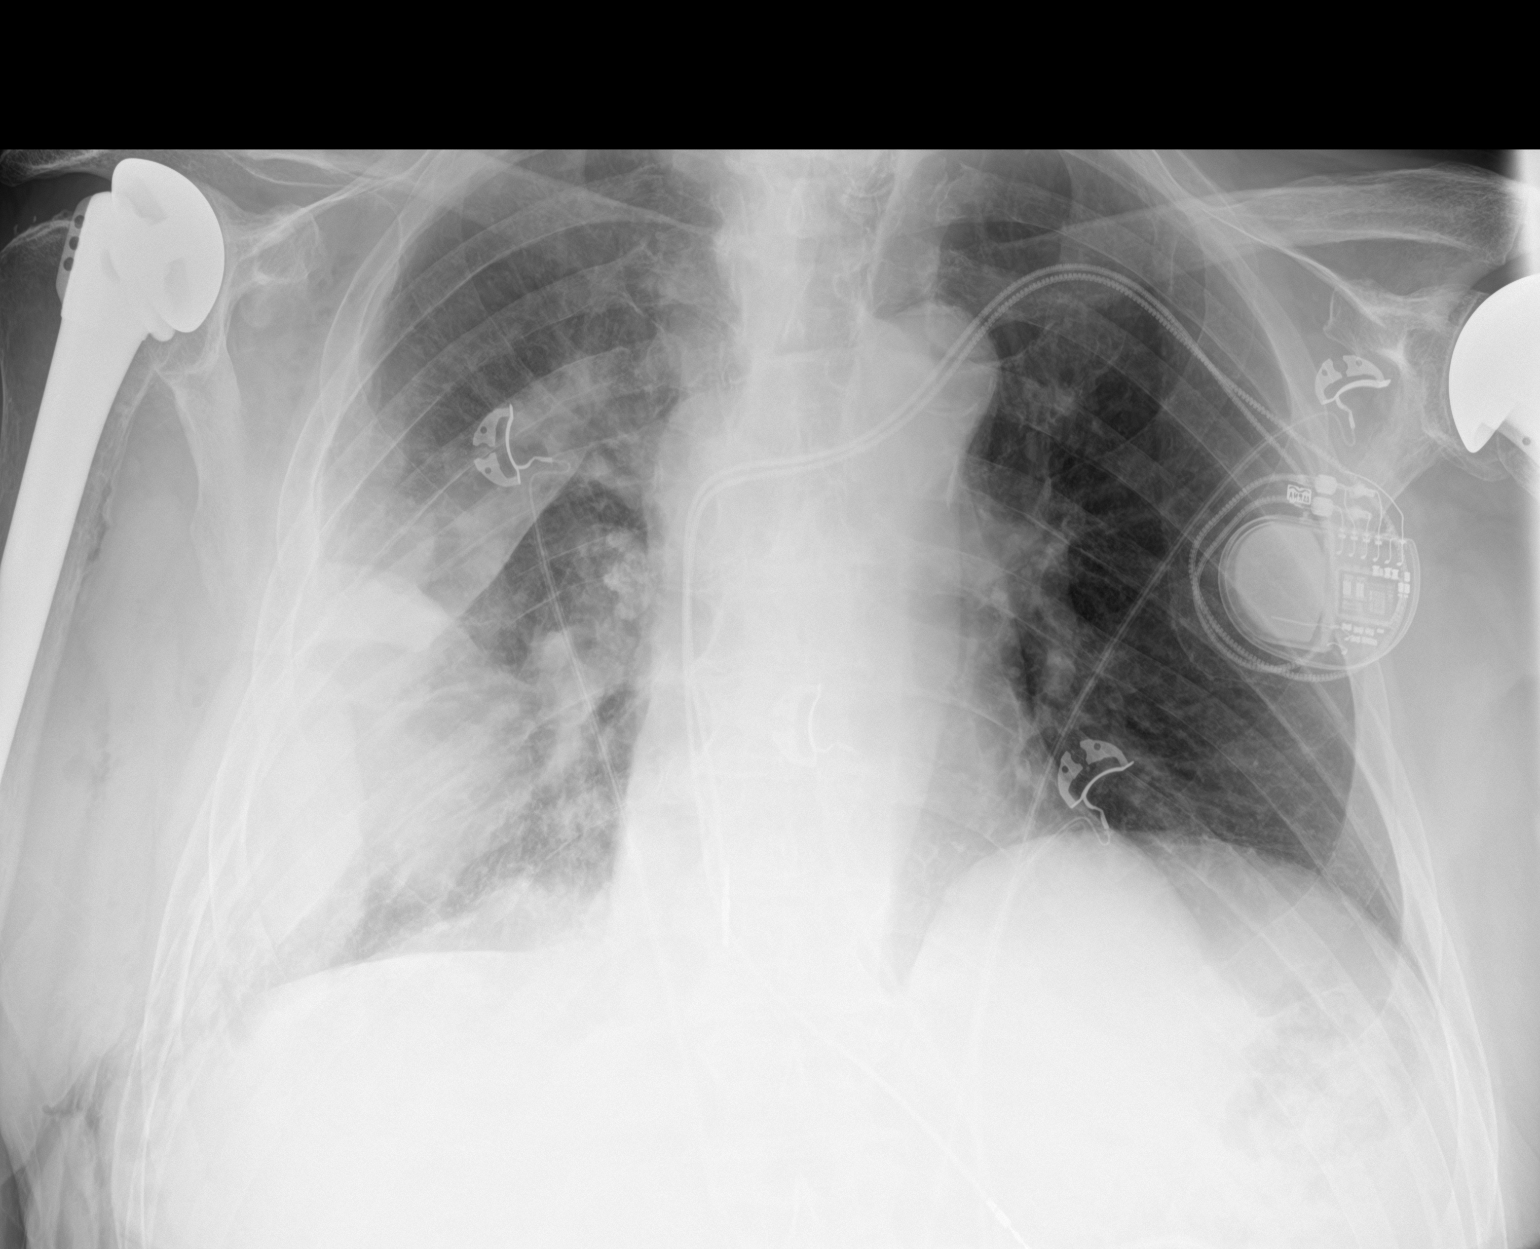

[2 of 2 positions shown; findings below may reference images not displayed]

FINDINGS: There is loculated pleural effusion on the right inferolaterally.
There is also fluid tracking along the right major fissure.

The left lung is clear. Heart size and pulmonary vascularity are
normal. There is less subcutaneous air on the right compared to most
recent study.

Pacemaker leads are attached to the right atrium and right
ventricle. There is aortic atherosclerosis. No evident adenopathy.

There are total shoulder replacements bilaterally.

Central catheter has been removed.  No evident pneumothorax.
IMPRESSION: Persistent loculated effusion on the right, most notably along the
inferolateral aspect. Underlying airspace opacity could be obscured
by this effusion.

Left lung clear. Stable cardiac silhouette. There is aortic
atherosclerosis. Pacemaker leads attached to right atrium and right
ventricle.

Aortic Atherosclerosis ([id number]).
